# Patient Record
Sex: Male | Born: 1940 | Race: White | Hispanic: No | Marital: Married | Smoking: Current every day smoker
Health system: Southern US, Community
[De-identification: ages and names within clinical notes are randomized; demographics above are authoritative.]

## PROBLEM LIST (undated history)

## (undated) DIAGNOSIS — D649 Anemia, unspecified: Secondary | ICD-10-CM

## (undated) DIAGNOSIS — F1011 Alcohol abuse, in remission: Secondary | ICD-10-CM

## (undated) DIAGNOSIS — J449 Chronic obstructive pulmonary disease, unspecified: Secondary | ICD-10-CM

## (undated) DIAGNOSIS — I10 Essential (primary) hypertension: Secondary | ICD-10-CM

## (undated) DIAGNOSIS — I4891 Unspecified atrial fibrillation: Secondary | ICD-10-CM

## (undated) DIAGNOSIS — K284 Chronic or unspecified gastrojejunal ulcer with hemorrhage: Secondary | ICD-10-CM

## (undated) DIAGNOSIS — J9600 Acute respiratory failure, unspecified whether with hypoxia or hypercapnia: Secondary | ICD-10-CM

## (undated) DIAGNOSIS — K922 Gastrointestinal hemorrhage, unspecified: Secondary | ICD-10-CM

## (undated) DIAGNOSIS — K274 Chronic or unspecified peptic ulcer, site unspecified, with hemorrhage: Secondary | ICD-10-CM

## (undated) HISTORY — DX: Chronic obstructive pulmonary disease, unspecified: J44.9

## (undated) HISTORY — DX: Acute respiratory failure, unspecified whether with hypoxia or hypercapnia: J96.00

## (undated) HISTORY — DX: Unspecified atrial fibrillation: I48.91

## (undated) HISTORY — PX: NO PAST SURGERIES: SHX2092

## (undated) HISTORY — DX: Alcohol abuse, in remission: F10.11

## (undated) HISTORY — DX: Gastrointestinal hemorrhage, unspecified: K92.2

## (undated) HISTORY — DX: Anemia, unspecified: D64.9

## (undated) HISTORY — DX: Essential (primary) hypertension: I10

---

## 2016-06-09 ENCOUNTER — Encounter: Payer: Self-pay | Admitting: Cardiovascular Disease

## 2016-07-02 ENCOUNTER — Encounter: Payer: Self-pay | Admitting: *Deleted

## 2016-07-05 ENCOUNTER — Encounter: Payer: Self-pay | Admitting: Cardiovascular Disease

## 2016-07-05 ENCOUNTER — Ambulatory Visit (INDEPENDENT_AMBULATORY_CARE_PROVIDER_SITE_OTHER): Payer: Medicare Other | Admitting: Cardiovascular Disease

## 2016-07-05 ENCOUNTER — Other Ambulatory Visit: Payer: Self-pay | Admitting: Cardiovascular Disease

## 2016-07-05 VITALS — BP 163/65 | HR 99 | Ht 67.0 in | Wt 207.0 lb

## 2016-07-05 DIAGNOSIS — F17210 Nicotine dependence, cigarettes, uncomplicated: Secondary | ICD-10-CM

## 2016-07-05 DIAGNOSIS — Z9289 Personal history of other medical treatment: Secondary | ICD-10-CM | POA: Diagnosis not present

## 2016-07-05 DIAGNOSIS — I4891 Unspecified atrial fibrillation: Secondary | ICD-10-CM | POA: Diagnosis not present

## 2016-07-05 DIAGNOSIS — Z716 Tobacco abuse counseling: Secondary | ICD-10-CM | POA: Diagnosis not present

## 2016-07-05 DIAGNOSIS — I10 Essential (primary) hypertension: Secondary | ICD-10-CM | POA: Diagnosis not present

## 2016-07-05 DIAGNOSIS — G4733 Obstructive sleep apnea (adult) (pediatric): Secondary | ICD-10-CM | POA: Diagnosis not present

## 2016-07-05 MED ORDER — DILTIAZEM HCL ER COATED BEADS 120 MG PO CP24: 120.0000 mg | ORAL_CAPSULE | ORAL | 6 refills | Status: DC

## 2016-07-05 MED ORDER — VALSARTAN 160 MG PO TABS: 160.0000 mg | ORAL_TABLET | Freq: Every day | ORAL | 6 refills | Status: DC

## 2016-07-05 NOTE — Progress Notes (Signed)
CARDIOLOGY CONSULT NOTE  Patient ID: [name] [name] MRN: [id number] DOB/AGE: Jul 26, 1940 76 y.o.  Admit date: (Not on file) Primary Physician: Ardyth Man, MD Referring Physician: Willaim Bane  Reason for Consultation: Atrial fibrillation  HPI: [name] [name] is a 76 y.o. male who is being seen today for the evaluation of atrial fibrillation at the request of Park, Nettie Elm, MD.   He was hospitalized at [facility] in late May/early June 2018. He reportedly had a COPD exacerbation, congestive heart failure, and atrial fibrillation.   I reviewed all relevant documentation, labs, and studies.   Troponins were normal. Left ventricular systolic function was normal. He had chest tightness upon admission. Chest x-ray reportedly demonstrated mild vascular congestion. He was discharged on Xarelto.  He has a history of hypertension as well.  Relevant labs: BUN 10, creatinine 0.75, sodium 130, potassium 3.9, hemoglobin 11.7 on 06/12/16.  Echocardiogram report dated 06/10/16 showed mild left atrial enlargement, mild LVH, normal left ventricular systolic function, and no significant valvular pathology. The faxed ECG was a poor quality tracing which reportedly demonstrated atrial fibrillation, heart rate 98 bpm. This was performed on 06/09/16.  He says since being hospitalized, he feels well on most days but does report feeling weak and tired on several mornings. He denies chest pain and palpitations. He also denies lightheadedness, dizziness, and syncope. He has chronic exertional dyspnea which has not gotten worse.  He smokes 1-2 packs of cigarettes daily and has done so for several decades.  He was diagnosed with sleep apnea 8-10 years ago and tried using CPAP but could not tolerate it and sold his machine.  He denies bleeding problems.     No Known Allergies  Current Outpatient Prescriptions  Medication Sig Dispense Refill  . albuterol (PROVENTIL) (2.5 MG/3ML) 0.083% nebulizer  solution Take 2.5 mg by nebulization every 6 (six) hours as needed for wheezing or shortness of breath.    Marland Kitchen amLODipine (NORVASC) 5 MG tablet Take 5 mg by mouth daily.    . cetirizine (ZYRTEC) 10 MG tablet Take 10 mg by mouth daily.    . fluticasone (FLONASE) 50 MCG/ACT nasal spray Place 2 sprays into both nostrils daily.    . furosemide (LASIX) 20 MG tablet TK 1 T PO BID  1  . lisinopril (PRINIVIL,ZESTRIL) 20 MG tablet 80 mg.  2  . potassium chloride (K-DUR) 10 MEQ tablet TK 1 T PO ONCE TO TWICE DAILY WITH FUROSEMIDE  1  . SPIRIVA HANDIHALER 18 MCG inhalation capsule INL CONTENTS OF 1 C ONCE DAILY USING HANDIHALER  1  . SYMBICORT 160-4.5 MCG/ACT inhaler INHALE 2 PUFFS BID  5  . tamsulosin (FLOMAX) 0.4 MG CAPS capsule TK ONE C PO ONCE TO  BID PRF URINARY FREQUENCY  2  . valsartan (DIOVAN) 80 MG tablet TK 1 T PO QD  1  . XARELTO 20 MG TABS tablet TK 1 T PO D WITH SUPPER  1   No current facility-administered medications for this visit.     Past Medical History:  Diagnosis Date  . COPD (chronic obstructive pulmonary disease) (HCC)   . History of alcohol abuse   . Hypertension     Past Surgical History:  Procedure Laterality Date  . NO PAST SURGERIES      Social History   Social History  . Marital status: Married    Spouse name: N/A  . Number of children: N/A  . Years of education: N/A   Occupational  History  . Not on file.   Social History Main Topics  . Smoking status: Current Every Day Smoker    Packs/day: 2.00    Types: Cigarettes  . Smokeless tobacco: Never Used  . Alcohol use Not on file  . Drug use: Unknown  . Sexual activity: Not on file   Other Topics Concern  . Not on file   Social History Narrative  . No narrative on file     No family history of premature CAD in 1st degree relatives.  Current Meds  Medication Sig  . albuterol (PROVENTIL) (2.5 MG/3ML) 0.083% nebulizer solution Take 2.5 mg by nebulization every 6 (six) hours as needed for wheezing or  shortness of breath.  Marland Kitchen. amLODipine (NORVASC) 5 MG tablet Take 5 mg by mouth daily.  . cetirizine (ZYRTEC) 10 MG tablet Take 10 mg by mouth daily.  . fluticasone (FLONASE) 50 MCG/ACT nasal spray Place 2 sprays into both nostrils daily.  . furosemide (LASIX) 20 MG tablet TK 1 T PO BID  . lisinopril (PRINIVIL,ZESTRIL) 20 MG tablet 80 mg.  . potassium chloride (K-DUR) 10 MEQ tablet TK 1 T PO ONCE TO TWICE DAILY WITH FUROSEMIDE  . SPIRIVA HANDIHALER 18 MCG inhalation capsule INL CONTENTS OF 1 C ONCE DAILY USING HANDIHALER  . SYMBICORT 160-4.5 MCG/ACT inhaler INHALE 2 PUFFS BID  . tamsulosin (FLOMAX) 0.4 MG CAPS capsule TK ONE C PO ONCE TO  BID PRF URINARY FREQUENCY  . valsartan (DIOVAN) 80 MG tablet TK 1 T PO QD  . XARELTO 20 MG TABS tablet TK 1 T PO D WITH SUPPER      Review of systems complete and found to be negative unless listed above in HPI    Physical exam Blood pressure (!) 163/65, pulse 99, height 5\' 7"  (1.702 m), weight 207 lb (93.9 kg), SpO2 97 %. General: NAD Neck: No JVD, no thyromegaly or thyroid nodule.  Lungs: Clear to auscultation bilaterally with normal respiratory effort. CV: Nondisplaced PMI.  Rate at upper normal limits, irregular rhythm, normal S1/S2, no S3, no murmur.  No peripheral edema.  No carotid bruit.    Abdomen: Soft, nontender, obese.  Skin: Intact without lesions or rashes.  Neurologic: Alert and oriented x 3.  Psych: Normal affect. Extremities: No clubbing or cyanosis.  HEENT: Normal.   ECG: Most recent ECG reviewed.   Labs: No results found for: K, BUN, CREATININE, ALT, TSH, HGB   Lipids: No results found for: LDLCALC, LDLDIRECT, CHOL, TRIG, HDL      ASSESSMENT AND PLAN:  1. Atrial fibrillation with complaints of weakness and fatigue: Anticoagulated with Xarelto. CHADSVASC score is 4. He needs more optimal rate control. I will discontinue amlodipine and start long-acting diltiazem 120 mg daily. I spoke to him about the importance of tobacco  cessation and sleep apnea treatment. I will obtain a sleep study.  2. Hypertension: He is hypertensive. He is taking both lisinopril and valsartan. There is no indication to be on both an ACEI and ARB. I will stop lisinopril and increase valsartan to 160 mg daily. He is also on amlodipine 5 mg daily. I have discontinued this in favor of long-acting diltiazem 120 mg daily for heart rate control.  3. Tobacco abuse: Cessation counseling provided (3 minutes).  4. Obstructive sleep apnea: I will obtain a sleep study. Sleep apnea is an independent risk factor for MI, stroke, and cardiac arrhythmia.  Disposition: Follow up in 1 month  Signed: Prentice DockerSuresh Katriona Schmierer, M.D., F.A.C.C.  07/05/2016, 10:00  AM

## 2016-07-05 NOTE — Patient Instructions (Addendum)
Medication Instructions:   Stop Amlodipine (Norvasc).  Begin Cardizem CD 120mg  every morning.  Stop Lisinopril.   Increase Valsartan 160mg  daily.  Continue all other medications.    Labwork: none  Testing/Procedures: You have been referred to:  [facility] for evaluation of sleep apnea.   Follow-Up: 4-6 weeks   Any Other Special Instructions Will Be Listed Below (If Applicable).  If you need a refill on your cardiac medications before your next appointment, please call your pharmacy.

## 2016-08-10 ENCOUNTER — Ambulatory Visit (INDEPENDENT_AMBULATORY_CARE_PROVIDER_SITE_OTHER): Payer: Medicare Other | Admitting: Neurology

## 2016-08-10 ENCOUNTER — Encounter (INDEPENDENT_AMBULATORY_CARE_PROVIDER_SITE_OTHER): Payer: Self-pay

## 2016-08-10 ENCOUNTER — Encounter: Payer: Self-pay | Admitting: Neurology

## 2016-08-10 VITALS — BP 148/69 | HR 40 | Ht 67.0 in | Wt 206.0 lb

## 2016-08-10 DIAGNOSIS — G4733 Obstructive sleep apnea (adult) (pediatric): Secondary | ICD-10-CM

## 2016-08-10 DIAGNOSIS — E669 Obesity, unspecified: Secondary | ICD-10-CM | POA: Diagnosis not present

## 2016-08-10 DIAGNOSIS — I481 Persistent atrial fibrillation: Secondary | ICD-10-CM | POA: Diagnosis not present

## 2016-08-10 DIAGNOSIS — J449 Chronic obstructive pulmonary disease, unspecified: Secondary | ICD-10-CM | POA: Diagnosis not present

## 2016-08-10 DIAGNOSIS — I4819 Other persistent atrial fibrillation: Secondary | ICD-10-CM

## 2016-08-10 DIAGNOSIS — R600 Localized edema: Secondary | ICD-10-CM | POA: Diagnosis not present

## 2016-08-10 DIAGNOSIS — F17200 Nicotine dependence, unspecified, uncomplicated: Secondary | ICD-10-CM | POA: Diagnosis not present

## 2016-08-10 DIAGNOSIS — F1190 Opioid use, unspecified, uncomplicated: Secondary | ICD-10-CM | POA: Diagnosis not present

## 2016-08-10 NOTE — Patient Instructions (Signed)
Based on your symptoms and your exam I believe you are at risk for obstructive sleep apnea or OSA, and I think we should proceed with a sleep study to determine whether you do or do not have OSA and how severe it is. If you have more than mild OSA, I want you to consider treatment with CPAP. Please remember, the risks and ramifications of moderate to severe obstructive sleep apnea or OSA are: Cardiovascular disease, including congestive heart failure, stroke, difficult to control hypertension, arrhythmias, and even type 2 diabetes has been linked to untreated OSA. Sleep apnea causes disruption of sleep and sleep deprivation in most cases, which, in turn, can cause recurrent headaches, problems with memory, mood, concentration, focus, and vigilance. Most people with untreated sleep apnea report excessive daytime sleepiness, which can affect their ability to drive. Please do not drive if you feel sleepy.   You are at risk for desaturations due to your smoking, obesity, COPD, and taking oxycodone.   I will likely see you back after your sleep study to go over the test results and where to go from there. We will call you after your sleep study to advise about the results (most likely, you will hear from Lafonda Mossesiana, my nurse) and to set up an appointment at the time, as necessary.    Our sleep lab administrative assistant, Alvis LemmingsDawn will meet with you or call you to schedule your sleep study. If you don't hear back from her by next week please feel free to call her at [phone]. This is her direct line and please leave a message with your phone number to call back if you get the voicemail box. She will call back as soon as possible.

## 2016-08-10 NOTE — Progress Notes (Addendum)
Subjective:    Patient ID: [name] [name] is a 76 y.o. male.  HPI     Huston Foley, MD, PhD [facility] [address] [address] [place], [place] [address]  Dear Darliss Ridgel,   I saw your patient, [name] [name], upon your kind request in my neurologic clinic today for initial consultation of his sleep disorder, in particular, reevaluation of his prior diagnosis of OSA. The patient is unaccompanied today. As you know, [name] [name] is a 76 year old right-handed gentleman with an underlying complex medical history of COPD, congestive heart failure, recent diagnosis of A. fib, hypertension, allergies, smoking of over 60 years (still at 2 ppd), alcohol abuse by chart review (patient says he is not a "drinker"), chronic LBP on chronic narcotic pain med and obesity, who was previously diagnosed with obstructive sleep apnea several years ago and placed on CPAP therapy. Prior sleep study results are not available for my review today. He is no longer on CPAP. A CPAP compliance download is not available for my review today. I reviewed your office note from 07/05/2016.  He drinks alcohol, in the form of whiskey, a mixed drink 2-3 per day, between 2 PM and 8 PM typically. He has no RLS symptoms, no FHx of OSA. Has nocturia currently up to 4 times per night, as he is on Lasix. He is on oxycodone per PCP, usually once daily. He does not see a pulmonologist for his COPD. He has a puppy, has been bruising very easily. Is on Xarelto. He has 2 grown sons, one is in business with him. 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 He was told to limit his water intake as his sodium level has been low on several occasions. Bedtime is around 9 or 10 PM, wakeup time around 5, he has typically no recurrent headaches or morning headaches. He would be willing to consider CPAP therapy again but in the past struggled with  the CPAP and did not get along, as he puts it. He does feel tired during the day. He does not have significant difficulty going to sleep or staying asleep. His Epworth sleepiness score is 13 out of 24, fatigue score is 31 out of 63.  His Past Medical History Is Significant For: Past Medical History:  Diagnosis Date  . Atrial fibrillation (HCC)   . COPD (chronic obstructive pulmonary disease) (HCC)   . History of alcohol abuse   . Hypertension     His Past Surgical History Is Significant For: Past Surgical History:  Procedure Laterality Date  . NO PAST SURGERIES      His Family History Is Significant For: Family History  Problem Relation Age of Onset  . Arrhythmia Brother   . Atrial fibrillation Brother     His Social History Is Significant For: Social History   Social History  . Marital status: Married    Spouse name: N/A  . Number of children: N/A  . Years of education: N/A   Social History Main Topics  . Smoking status: Current Every Day Smoker    Packs/day: 2.00    Types: Cigarettes  . Smokeless tobacco: Never Used  . Alcohol use None  . Drug use: Unknown  . Sexual activity: Not Asked   Other Topics Concern  . None   Social History Narrative  . None    His Allergies Are:  No Known Allergies:  His Current Medications Are:  Outpatient Encounter Prescriptions as of 08/10/2016  Medication Sig  . albuterol (PROVENTIL) (2.5 MG/3ML) 0.083% nebulizer solution Take 2.5 mg by nebulization every 6 (six) hours as needed for wheezing or shortness of breath.  . CARTIA XT 120 MG 24 hr capsule TAKE 1 CAPSULE BY MOUTH EVERY MORNING  . cetirizine (ZYRTEC) 10 MG tablet Take 10 mg by mouth daily.  . fluticasone (FLONASE) 50 MCG/ACT nasal spray Place 2 sprays into both nostrils daily.  . furosemide (LASIX) 20 MG tablet TK 1 T PO BID  . oxyCODONE-acetaminophen (PERCOCET/ROXICET) 5-325 MG tablet Take 1 tablet by mouth every 6 (six) hours as needed.  . potassium chloride  (K-DUR) 10 MEQ tablet TK 1 T PO ONCE TO TWICE DAILY WITH FUROSEMIDE  . SPIRIVA HANDIHALER 18 MCG inhalation capsule INL CONTENTS OF 1 C ONCE DAILY USING HANDIHALER  . SYMBICORT 160-4.5 MCG/ACT inhaler INHALE 2 PUFFS BID  . tamsulosin (FLOMAX) 0.4 MG CAPS capsule TK ONE C PO ONCE TO  BID PRF URINARY FREQUENCY  . valsartan (DIOVAN) 160 MG tablet TAKE 1 TABLET BY MOUTH DAILY  . XARELTO 20 MG TABS tablet TK 1 T PO D WITH SUPPER   No facility-administered encounter medications on file as of 08/10/2016.   :  Review of Systems:  Out of a complete 14 point review of systems, all are reviewed and negative with the exception of these symptoms as listed below:  Review of Systems  Neurological:       Pt presents today to discuss his sleep. Pt had a sleep study over 8 years ago and has tried cpap but "did not get along." Pt reports that he does not snore as bad now as he used to.  Epworth Sleepiness Scale 0= would never doze 1= slight chance of dozing 2= moderate chance of dozing 3= high chance of dozing  Sitting and reading: 3 Watching TV: 3 Sitting inactive in a public place (ex. Theater or meeting): 3 As a passenger in a car for an hour without a break: 1 Lying down to rest in the afternoon: 2 Sitting and talking to someone: 0 Sitting quietly after lunch (no alcohol): 1 In a car, while stopped in traffic: 0 Total: 13     Objective:  Neurological Exam  Physical Exam Physical Examination:   Vitals:   08/10/16 0838  BP: (!) 148/69  Pulse: (!) 40    General Examination: The patient is a very pleasant 76 y.o. male in no acute distress. He appears deconditioned. He is well groomed. He is wheezing and seems to be slightly short of breath at rest. He does report that he has a nebulizer which he was planning to use this morning but did not get around to doing it.   HEENT: Normocephalic, atraumatic, pupils are equal, round and reactive to light and accommodation. He has corrective  eyeglasses. Extraocular tracking is good without limitation to gaze excursion or nystagmus noted. Normal smooth pursuit is noted. Hearing is mildly impaired. Face is symmetric with normal facial animation and normal facial sensation. Speech is clear with  edentulous speech. His voice is raspy. Neck circumference is 18-1/8 inches. He has a soft tissue swelling in the posterior right neck, dime size, movable, may be a lymph node, does not appear to be soft enough for fatty tissue/lipoma. He is advised to talk to his primary care physician about it. He has noted the swelling in the past 6 months. There is no lip, neck/head, jaw  or voice tremor. Neck is supple with full range of passive and active motion. Oropharynx exam reveals: moderate mouth dryness, edentulous, market airway crowding secondary to thicker tongue, thicker soft palate, redundant soft palate, large uvula, tonsils in place, tonsils not fully visualized. Mallampati is class III. Tongue protrudes centrally and palate elevates symmetrically.   Chest: Inspiratory coarse breath sounds and expiratory wheezing noted throughout.   Heart: S1+S2+0,  irregular pulse and heart rate.   Abdomen: Soft, non-tender and non-distended with normal bowel sounds appreciated on auscultation.  Extremities: There is 2+ pitting edema in the distal lower extremities bilaterally. Pedal pulses are intact.  Skin: Warm and dry without trophic changes noted. There are no varicose veins.chronic appearing swelling, thickened skin, redness of distal lower extremities throughout. Multiple bruises noted throughout the forearms and hands.   Musculoskeletal: exam reveals no obvious joint deformities, tenderness or joint swelling or erythema.   Neurologically:  Mental status: The patient is awake, alert and oriented in all 4 spheres. His immediate and remote memory, attention, language skills and fund of knowledge are appropriate. There is no evidence of aphasia, agnosia,  apraxia or anomia. Speech is clear with normal prosody and enunciation. Thought process is linear. Mood is normal and affect is normal.  Cranial nerves II - XII are as described above under HEENT exam. In addition: shoulder shrug is normal with equal shoulder height noted. Motor exam: Normal bulk, strength and tone is noted. There is no drift, tremor or rebound. Romberg is negative. Reflexes are 1+ throughout. Fine motor skills and coordination: intact with normal finger taps, normal hand movements, normal rapid alternating patting, normal foot taps and normal foot agility.  Cerebellar testing: No dysmetria or intention tremor. There is no truncal or gait ataxia.  Sensory exam: intact to light touch in the upper and lower extremities.  Gait, station and balance: He stands with mild difficulty, stands naturally a little wide-based, walks slowly and cautiously, tandem walk is not requested for safety.                Assessment and Plan:  In summary, [name] [name] is a very pleasant 76 y.o.-year old male with an underlying complex medical history of COPD, congestive heart failure, recent diagnosis of A. fib, hypertension, allergies, smoking of over 60 years (still at 2 ppd), alcohol abuse by chart review (patient says he is not a "drinker"), chronic LBP on chronic narcotic pain med and obesity, whose history and physical exam are in keeping with obstructive sleep apnea (OSA)And COPD overlap. He is at high risk for severe desaturations during sleep and I advised him of this. His risk factors for severe oxygen desaturations are obesity, obstructive sleep apnea, taking chronic narcotic pain medication, COPD and ongoing smoking. He is strongly advised to quit smoking. He is advised to talk to his PCP about seeing a pulmonologist. He is wheezing today. he is furthermore advised to limit his alcohol intake because it can affect his balance, and cause sleep disruption. Of note, he does report a recent fall in the  past 6 months, did not feel lightheaded, had stepped out of the shower and was on his way back from the laundry room, thankfully no injuries, fell onto a sofa. Given his anticoagulation he is at risk for severe injuries and bleeds. He is also advised to talk to his primary care physician about the soft tissue swelling in the posterior right neck, could be a lymph node. I had a long chat with the  patient about my findings and the diagnosis of OSA, its prognosis and treatment options. We talked about medical treatments, surgical interventions and non-pharmacological approaches. I explained in particular the risks and ramifications of untreated moderate to severe OSA, especially with respect to developing cardiovascular disease down the Road, including congestive heart failure, difficult to treat hypertension, cardiac arrhythmias, or stroke. Even type 2 diabetes has, in part, been linked to untreated OSA. Symptoms of untreated OSA include daytime sleepiness, memory problems, mood irritability and mood disorder such as depression and anxiety, lack of energy, as well as recurrent headaches, especially morning headaches. We talked about smoking cessation and trying to maintain a healthy lifestyle in general, as well as the importance of weight control. I encouraged the patient to eat healthy, exercise daily and keep well hydrated, to keep a scheduled bedtime and wake time routine, to not skip any meals and eat healthy snacks in between meals. I advised the patient not to drive when feeling sleepy. I recommended the following at this time: sleep study with potential positive airway pressure titration. (We will score hypopneas at 4%).   I explained the sleep test procedure to the patient and also outlined possible surgical and non-surgical treatment options of OSA, including the use of a custom-made dental device (which would require a referral to a specialist dentist or oral surgeon), upper airway surgical options,  such as pillar implants, radiofrequency surgery, tongue base surgery, and UPPP (which would involve a referral to an ENT surgeon). Rarely, jaw surgery such as mandibular advancement may be considered.  I also explained the CPAP treatment option to the patient, who indicated that he would be willing to try CPAP if the need arises. I explained the importance of being compliant with PAP treatment, not only for insurance purposes but primarily to improve His symptoms, and for the patient's long term health benefit, including to reduce His cardiovascular risks. I answered all his questions today and the patient was in agreement. I would like to see him back after the sleep study is completed and encouraged him to call with any interim questions, concerns, problems or updates.   Thank you very much for allowing me to participate in the care of this nice patient. If I can be of any further assistance to you please do not hesitate to call me at [phone].  Sincerely,   Huston FoleySaima Brithany Whitworth, MD, PhD

## 2016-08-12 ENCOUNTER — Other Ambulatory Visit: Payer: Self-pay | Admitting: Cardiovascular Disease

## 2016-08-12 ENCOUNTER — Encounter: Payer: Self-pay | Admitting: Cardiovascular Disease

## 2016-08-12 ENCOUNTER — Ambulatory Visit (INDEPENDENT_AMBULATORY_CARE_PROVIDER_SITE_OTHER): Payer: Medicare Other | Admitting: Cardiovascular Disease

## 2016-08-12 ENCOUNTER — Encounter: Payer: Self-pay | Admitting: *Deleted

## 2016-08-12 VITALS — BP 151/68 | HR 66 | Ht 67.0 in | Wt 207.0 lb

## 2016-08-12 DIAGNOSIS — I10 Essential (primary) hypertension: Secondary | ICD-10-CM | POA: Diagnosis not present

## 2016-08-12 DIAGNOSIS — I4891 Unspecified atrial fibrillation: Secondary | ICD-10-CM

## 2016-08-12 DIAGNOSIS — Z720 Tobacco use: Secondary | ICD-10-CM

## 2016-08-12 DIAGNOSIS — G4733 Obstructive sleep apnea (adult) (pediatric): Secondary | ICD-10-CM

## 2016-08-12 DIAGNOSIS — I5033 Acute on chronic diastolic (congestive) heart failure: Secondary | ICD-10-CM

## 2016-08-12 MED ORDER — LOSARTAN POTASSIUM 50 MG PO TABS: 75.0000 mg | ORAL_TABLET | Freq: Every day | ORAL | 6 refills | Status: DC

## 2016-08-12 MED ORDER — FUROSEMIDE 40 MG PO TABS: 40.0000 mg | ORAL_TABLET | Freq: Every day | ORAL | Status: DC

## 2016-08-12 MED ORDER — FUROSEMIDE 40 MG PO TABS: 40.0000 mg | ORAL_TABLET | Freq: Every day | ORAL | 6 refills | Status: DC

## 2016-08-12 NOTE — Progress Notes (Signed)
SUBJECTIVE: The patient presents for follow-up of atrial fibrillation and chronic diastolic heart failure. Echocardiogram report dated 06/10/16 showed mild left atrial enlargement, mild LVH, normal left ventricular systolic function, and no significant valvular pathology.   He recently developed bilateral leg swelling and abdominal distention and has been taking Lasix 20 mg twice daily with symptom improvement.  He tells me he is not interested in repeating a sleep study but also does not want to go against doctor's recommendations.   Review of Systems: As per "subjective", otherwise negative.  No Known Allergies  Current Outpatient Prescriptions  Medication Sig Dispense Refill  . albuterol (PROVENTIL) (2.5 MG/3ML) 0.083% nebulizer solution Take 2.5 mg by nebulization every 6 (six) hours as needed for wheezing or shortness of breath.    . CARTIA XT 120 MG 24 hr capsule TAKE 1 CAPSULE BY MOUTH EVERY MORNING 90 capsule 6  . cetirizine (ZYRTEC) 10 MG tablet Take 10 mg by mouth daily.    . fluticasone (FLONASE) 50 MCG/ACT nasal spray Place 2 sprays into both nostrils daily.    . furosemide (LASIX) 20 MG tablet TK 1 T PO BID  1  . losartan (COZAAR) 50 MG tablet Take 50 mg by mouth daily.    Marland Kitchen. oxyCODONE-acetaminophen (PERCOCET/ROXICET) 5-325 MG tablet Take 1 tablet by mouth every 6 (six) hours as needed.  0  . potassium chloride (K-DUR) 10 MEQ tablet TK 1 T PO ONCE TO TWICE DAILY WITH FUROSEMIDE  1  . SPIRIVA HANDIHALER 18 MCG inhalation capsule INL CONTENTS OF 1 C ONCE DAILY USING HANDIHALER  1  . SYMBICORT 160-4.5 MCG/ACT inhaler INHALE 2 PUFFS BID  5  . tamsulosin (FLOMAX) 0.4 MG CAPS capsule TK ONE C PO ONCE TO  BID PRF URINARY FREQUENCY  2  . XARELTO 20 MG TABS tablet TK 1 T PO D WITH SUPPER  1   No current facility-administered medications for this visit.     Past Medical History:  Diagnosis Date  . Atrial fibrillation (HCC)   . COPD (chronic obstructive pulmonary disease)  (HCC)   . History of alcohol abuse   . Hypertension     Past Surgical History:  Procedure Laterality Date  . NO PAST SURGERIES      Social History   Social History  . Marital status: Married    Spouse name: N/A  . Number of children: N/A  . Years of education: N/A   Occupational History  . Not on file.   Social History Main Topics  . Smoking status: Current Every Day Smoker    Packs/day: 2.00    Types: Cigarettes  . Smokeless tobacco: Never Used  . Alcohol use Not on file  . Drug use: Unknown  . Sexual activity: Not on file   Other Topics Concern  . Not on file   Social History Narrative  . No narrative on file     Vitals:   08/12/16 1106  BP: (!) 151/68  Pulse: 66  SpO2: 98%  Weight: 207 lb (93.9 kg)  Height: 5\' 7"  (1.702 m)    Wt Readings from Last 3 Encounters:  08/12/16 207 lb (93.9 kg)  08/10/16 206 lb (93.4 kg)  07/05/16 207 lb (93.9 kg)     PHYSICAL EXAM General: NAD HEENT: Normal. Neck: No JVD, no thyromegaly. Lungs: Bilateral faint inspiratory and expiratory wheezes. CV: Nondisplaced PMI.  Regular rate and irregular rhythm, normal S1/S2, no S3, no murmur. Trace bilateral pretibial edema.   Abdomen:  Firm, protuberant.  Neurologic: Alert and oriented.  Psych: Normal affect. Skin: Normal. Musculoskeletal: No gross deformities.    ECG: Most recent ECG reviewed.   Labs: No results found for: K, BUN, CREATININE, ALT, TSH, HGB   Lipids: No results found for: LDLCALC, LDLDIRECT, CHOL, TRIG, HDL     ASSESSMENT AND PLAN:  1. Atrial fibrillation with complaints of weakness and fatigue: Symptomatically improved with good heart rate control. Anticoagulated with Xarelto. CHADSVASC score is 4. HR controlled on long-acting diltiazem 120 mg daily. He also needs treatment for sleep apnea and is now being followed by sleep specialist. I strongly encouraged him to follow the advice of both his neurologist and myself and pursue a sleep study.  2.  Hypertension: He is hypertensive. Sleep apnea is contributing. Will continue to monitor this. I will increase losartan to 75 mg daily.  3. Tobacco abuse: Cessation counseling previously provided.  4. Obstructive sleep apnea:Sleep study demonstrated obstructive sleep apnea. He is now being followed by a sleep specialist. I strongly encouraged him to follow the advice of both his neurologist and myself and pursue a sleep study.  5. Acute on chronic diastolic heart failure: I instructed him to take Lasix 40 mg daily with supplemental potassium. I also encouraged him to pursue sleep apnea treatment.   Disposition: Follow up 3 months   Prentice DockerSuresh Koneswaran, M.D., F.A.C.C.

## 2016-08-12 NOTE — Patient Instructions (Signed)
Medication Instructions:   Increase Losartan to 75mg  daily.  Change Lasix to 40mg  daily  Continue all other current medications.  Labwork: none  Testing/Procedures: none  Follow-Up: 3 months   Any Other Special Instructions Will Be Listed Below (If Applicable).  If you need a refill on your cardiac medications before your next appointment, please call your pharmacy.

## 2016-11-15 ENCOUNTER — Other Ambulatory Visit: Payer: Self-pay | Admitting: Cardiovascular Disease

## 2016-11-15 ENCOUNTER — Encounter: Payer: Self-pay | Admitting: Cardiovascular Disease

## 2016-11-15 ENCOUNTER — Ambulatory Visit (INDEPENDENT_AMBULATORY_CARE_PROVIDER_SITE_OTHER): Payer: Medicare Other | Admitting: Cardiovascular Disease

## 2016-11-15 VITALS — BP 159/75 | HR 76 | Ht 67.0 in | Wt 213.8 lb

## 2016-11-15 DIAGNOSIS — I5033 Acute on chronic diastolic (congestive) heart failure: Secondary | ICD-10-CM

## 2016-11-15 DIAGNOSIS — G4733 Obstructive sleep apnea (adult) (pediatric): Secondary | ICD-10-CM

## 2016-11-15 DIAGNOSIS — I10 Essential (primary) hypertension: Secondary | ICD-10-CM

## 2016-11-15 DIAGNOSIS — Z720 Tobacco use: Secondary | ICD-10-CM | POA: Diagnosis not present

## 2016-11-15 DIAGNOSIS — I4891 Unspecified atrial fibrillation: Secondary | ICD-10-CM

## 2016-11-15 MED ORDER — LOSARTAN POTASSIUM 100 MG PO TABS: 100.0000 mg | ORAL_TABLET | Freq: Every day | ORAL | 6 refills | Status: DC

## 2016-11-15 NOTE — Patient Instructions (Signed)
Medication Instructions:   Increase Losartan to 100mg  daily.   Continue all other medications.    Labwork: none  Testing/Procedures: none  Follow-Up: Your physician wants you to follow up in: 6 months.  You will receive a reminder letter in the mail one-two months in advance.  If you don't receive a letter, please call our office to schedule the follow up appointment   Any Other Special Instructions Will Be Listed Below (If Applicable).  If you need a refill on your cardiac medications before your next appointment, please call your pharmacy.

## 2016-11-15 NOTE — Progress Notes (Signed)
SUBJECTIVE: The patient presents for follow-up of atrial fibrillation chronic diastolic heart failure.  He said his ankle swell up about once every 6 months and takes Lasix with supplemental potassium during those times.  He recently started taking Lasix about 3 days ago for bilateral ankle and leg swelling.  He said his systolic blood pressure normally runs in the 150-170 range.      Review of Systems: As per "subjective", otherwise negative.  No Known Allergies  Current Outpatient Medications  Medication Sig Dispense Refill  . albuterol (PROVENTIL) (2.5 MG/3ML) 0.083% nebulizer solution Take 2.5 mg by nebulization every 6 (six) hours as needed for wheezing or shortness of breath.    . CARTIA XT 120 MG 24 hr capsule TAKE 1 CAPSULE BY MOUTH EVERY MORNING 90 capsule 6  . cetirizine (ZYRTEC) 10 MG tablet Take 10 mg by mouth daily.    . fluticasone (FLONASE) 50 MCG/ACT nasal spray Place 2 sprays into both nostrils daily.    . furosemide (LASIX) 40 MG tablet Take 40 mg daily as needed by mouth.    Marland Kitchen. ibuprofen (ADVIL,MOTRIN) 800 MG tablet Take 1 tablet daily as needed by mouth.  2  . losartan (COZAAR) 100 MG tablet Take 1 tablet (100 mg total) daily by mouth. 30 tablet 6  . oxyCODONE-acetaminophen (PERCOCET/ROXICET) 5-325 MG tablet Take 1 tablet by mouth every 6 (six) hours as needed.  0  . potassium chloride (K-DUR) 10 MEQ tablet TK 1 T PO ONCE TO TWICE DAILY WITH FUROSEMIDE  1  . potassium chloride SA (K-DUR,KLOR-CON) 20 MEQ tablet Take 1 tablet daily as needed by mouth.  1  . SPIRIVA HANDIHALER 18 MCG inhalation capsule INL CONTENTS OF 1 C ONCE DAILY USING HANDIHALER  1  . SYMBICORT 160-4.5 MCG/ACT inhaler INHALE 2 PUFFS BID  5  . tamsulosin (FLOMAX) 0.4 MG CAPS capsule TK ONE C PO ONCE TO  BID PRF URINARY FREQUENCY  2  . XARELTO 20 MG TABS tablet TK 1 T PO D WITH SUPPER  1   No current facility-administered medications for this visit.     Past Medical History:  Diagnosis  Date  . Atrial fibrillation (HCC)   . COPD (chronic obstructive pulmonary disease) (HCC)   . History of alcohol abuse   . Hypertension     Past Surgical History:  Procedure Laterality Date  . NO PAST SURGERIES      Social History   Socioeconomic History  . Marital status: Married    Spouse name: Not on file  . Number of children: Not on file  . Years of education: Not on file  . Highest education level: Not on file  Social Needs  . Financial resource strain: Not on file  . Food insecurity - worry: Not on file  . Food insecurity - inability: Not on file  . Transportation needs - medical: Not on file  . Transportation needs - non-medical: Not on file  Occupational History  . Not on file  Tobacco Use  . Smoking status: Current Every Day Smoker    Packs/day: 2.00    Types: Cigarettes    Start date: 11/07/1952  . Smokeless tobacco: Never Used  Substance and Sexual Activity  . Alcohol use: Not on file  . Drug use: Not on file  . Sexual activity: Not on file  Other Topics Concern  . Not on file  Social History Narrative  . Not on file     Vitals:  11/15/16 0756  BP: (!) 159/75  Pulse: 76  SpO2: 96%  Weight: 213 lb 12.8 oz (97 kg)  Height: 5\' 7"  (1.702 m)    Wt Readings from Last 3 Encounters:  11/15/16 213 lb 12.8 oz (97 kg)  08/12/16 207 lb (93.9 kg)  08/10/16 206 lb (93.4 kg)     PHYSICAL EXAM General: NAD HEENT: Normal. Neck: No JVD, no thyromegaly. Lungs: Clear to auscultation bilaterally with normal respiratory effort. CV: Regular rate and irregular rhythm, normal S1/S2, no S3, no murmur. Trace bilateral pretibial and periankle edema.    Abdomen: Soft, nontender, no distention.  Neurologic: Alert and oriented.  Psych: Normal affect. Skin: Normal. Musculoskeletal: No gross deformities.    ECG: Most recent ECG reviewed.   Labs: No results found for: K, BUN, CREATININE, ALT, TSH, HGB   Lipids: No results found for: LDLCALC, LDLDIRECT,  CHOL, TRIG, HDL     ASSESSMENT AND PLAN: 1.  Persistent atrial fibrillation: Symptomatically stable with adequate heart rate control on long-acting diltiazem 120 mill grams daily.  He is anticoagulated with Xarelto.  2.  Chronic hypertension: Blood pressure is elevated.  I will increase losartan to 100 mg daily.  3.  Tobacco abuse: Cessation counseling previous he provided.  4.  Obstructive sleep apnea: Sleep study previously recommended.  5.  Acute on chronic diastolic heart failure:  He is currently taking Lasix with supplemental potassium.  I will aim to control blood pressure.       Disposition: Follow up 6 months   Prentice Docker, M.D., F.A.C.C.

## 2016-12-26 ENCOUNTER — Inpatient Hospital Stay (HOSPITAL_COMMUNITY)
Admission: AD | Admit: 2016-12-26 | Discharge: 2017-01-17 | DRG: 377 | Disposition: A | Discharge status: Skilled Nursing Facility | Payer: Medicare Other | Source: Other Acute Inpatient Hospital | Attending: Family Medicine | Admitting: Family Medicine

## 2016-12-26 DIAGNOSIS — J441 Chronic obstructive pulmonary disease with (acute) exacerbation: Secondary | ICD-10-CM | POA: Diagnosis present

## 2016-12-26 DIAGNOSIS — K2960 Other gastritis without bleeding: Secondary | ICD-10-CM | POA: Diagnosis not present

## 2016-12-26 DIAGNOSIS — R0602 Shortness of breath: Secondary | ICD-10-CM | POA: Diagnosis not present

## 2016-12-26 DIAGNOSIS — Z66 Do not resuscitate: Secondary | ICD-10-CM | POA: Diagnosis not present

## 2016-12-26 DIAGNOSIS — E538 Deficiency of other specified B group vitamins: Secondary | ICD-10-CM | POA: Diagnosis not present

## 2016-12-26 DIAGNOSIS — G4733 Obstructive sleep apnea (adult) (pediatric): Secondary | ICD-10-CM | POA: Diagnosis not present

## 2016-12-26 DIAGNOSIS — F10231 Alcohol dependence with withdrawal delirium: Secondary | ICD-10-CM | POA: Diagnosis present

## 2016-12-26 DIAGNOSIS — K922 Gastrointestinal hemorrhage, unspecified: Secondary | ICD-10-CM | POA: Diagnosis not present

## 2016-12-26 DIAGNOSIS — T502X5A Adverse effect of carbonic-anhydrase inhibitors, benzothiadiazides and other diuretics, initial encounter: Secondary | ICD-10-CM | POA: Diagnosis not present

## 2016-12-26 DIAGNOSIS — K257 Chronic gastric ulcer without hemorrhage or perforation: Secondary | ICD-10-CM

## 2016-12-26 DIAGNOSIS — E860 Dehydration: Secondary | ICD-10-CM | POA: Diagnosis not present

## 2016-12-26 DIAGNOSIS — K254 Chronic or unspecified gastric ulcer with hemorrhage: Secondary | ICD-10-CM | POA: Diagnosis present

## 2016-12-26 DIAGNOSIS — J9601 Acute respiratory failure with hypoxia: Secondary | ICD-10-CM | POA: Diagnosis present

## 2016-12-26 DIAGNOSIS — E871 Hypo-osmolality and hyponatremia: Secondary | ICD-10-CM | POA: Diagnosis present

## 2016-12-26 DIAGNOSIS — R609 Edema, unspecified: Secondary | ICD-10-CM | POA: Diagnosis not present

## 2016-12-26 DIAGNOSIS — E876 Hypokalemia: Secondary | ICD-10-CM | POA: Diagnosis not present

## 2016-12-26 DIAGNOSIS — I482 Chronic atrial fibrillation: Secondary | ICD-10-CM | POA: Diagnosis not present

## 2016-12-26 DIAGNOSIS — J9602 Acute respiratory failure with hypercapnia: Secondary | ICD-10-CM | POA: Diagnosis present

## 2016-12-26 DIAGNOSIS — F17210 Nicotine dependence, cigarettes, uncomplicated: Secondary | ICD-10-CM | POA: Diagnosis present

## 2016-12-26 DIAGNOSIS — I110 Hypertensive heart disease with heart failure: Secondary | ICD-10-CM | POA: Diagnosis present

## 2016-12-26 DIAGNOSIS — R630 Anorexia: Secondary | ICD-10-CM | POA: Diagnosis present

## 2016-12-26 DIAGNOSIS — G9340 Encephalopathy, unspecified: Secondary | ICD-10-CM | POA: Diagnosis not present

## 2016-12-26 DIAGNOSIS — R509 Fever, unspecified: Secondary | ICD-10-CM | POA: Diagnosis not present

## 2016-12-26 DIAGNOSIS — F0390 Unspecified dementia without behavioral disturbance: Secondary | ICD-10-CM | POA: Diagnosis present

## 2016-12-26 DIAGNOSIS — I5031 Acute diastolic (congestive) heart failure: Secondary | ICD-10-CM | POA: Diagnosis not present

## 2016-12-26 DIAGNOSIS — G9349 Other encephalopathy: Secondary | ICD-10-CM | POA: Diagnosis present

## 2016-12-26 DIAGNOSIS — J9600 Acute respiratory failure, unspecified whether with hypoxia or hypercapnia: Secondary | ICD-10-CM | POA: Diagnosis not present

## 2016-12-26 DIAGNOSIS — K259 Gastric ulcer, unspecified as acute or chronic, without hemorrhage or perforation: Secondary | ICD-10-CM | POA: Diagnosis present

## 2016-12-26 DIAGNOSIS — Z7901 Long term (current) use of anticoagulants: Secondary | ICD-10-CM

## 2016-12-26 DIAGNOSIS — I4892 Unspecified atrial flutter: Secondary | ICD-10-CM | POA: Diagnosis present

## 2016-12-26 DIAGNOSIS — M899 Disorder of bone, unspecified: Secondary | ICD-10-CM | POA: Diagnosis not present

## 2016-12-26 DIAGNOSIS — I5033 Acute on chronic diastolic (congestive) heart failure: Secondary | ICD-10-CM | POA: Diagnosis present

## 2016-12-26 DIAGNOSIS — R0902 Hypoxemia: Secondary | ICD-10-CM

## 2016-12-26 DIAGNOSIS — K921 Melena: Secondary | ICD-10-CM

## 2016-12-26 DIAGNOSIS — Z7951 Long term (current) use of inhaled steroids: Secondary | ICD-10-CM

## 2016-12-26 DIAGNOSIS — R1310 Dysphagia, unspecified: Secondary | ICD-10-CM | POA: Diagnosis present

## 2016-12-26 DIAGNOSIS — K2970 Gastritis, unspecified, without bleeding: Secondary | ICD-10-CM | POA: Diagnosis not present

## 2016-12-26 DIAGNOSIS — D62 Acute posthemorrhagic anemia: Secondary | ICD-10-CM

## 2016-12-26 DIAGNOSIS — I4891 Unspecified atrial fibrillation: Secondary | ICD-10-CM | POA: Diagnosis not present

## 2016-12-26 DIAGNOSIS — N400 Enlarged prostate without lower urinary tract symptoms: Secondary | ICD-10-CM | POA: Diagnosis present

## 2016-12-26 DIAGNOSIS — I10 Essential (primary) hypertension: Secondary | ICD-10-CM | POA: Diagnosis not present

## 2016-12-26 DIAGNOSIS — Y900 Blood alcohol level of less than 20 mg/100 ml: Secondary | ICD-10-CM | POA: Diagnosis present

## 2016-12-26 DIAGNOSIS — K7030 Alcoholic cirrhosis of liver without ascites: Secondary | ICD-10-CM | POA: Diagnosis present

## 2016-12-26 DIAGNOSIS — Z79899 Other long term (current) drug therapy: Secondary | ICD-10-CM

## 2016-12-26 LAB — GLUCOSE, CAPILLARY
GLUCOSE-CAPILLARY: 126 mg/dL — AB (ref 65–99)
Glucose-Capillary: 100 mg/dL — ABNORMAL HIGH (ref 65–99)
Glucose-Capillary: 147 mg/dL — ABNORMAL HIGH (ref 65–99)

## 2016-12-26 LAB — MRSA PCR SCREENING: MRSA BY PCR: NEGATIVE

## 2016-12-26 LAB — PROTIME-INR
INR: 1.12
Prothrombin Time: 14.3 seconds (ref 11.4–15.2)

## 2016-12-26 LAB — APTT: APTT: 23 s — AB (ref 24–36)

## 2016-12-26 LAB — VITAMIN B12: Vitamin B-12: >7500 pg/mL — ABNORMAL HIGH (ref 180–914)

## 2016-12-26 LAB — ABO/RH: ABO/RH(D): O POS

## 2016-12-26 MED ORDER — CEFTRIAXONE SODIUM 1 G IJ SOLR
1.0000 g | INTRAMUSCULAR | Status: DC
  Administered 2016-12-26: 1 g via INTRAVENOUS
  Filled 2016-12-26: qty 10

## 2016-12-26 MED ORDER — PANTOPRAZOLE SODIUM 40 MG IV SOLR
40.0000 mg | Freq: Every day | INTRAVENOUS | Status: DC
  Administered 2016-12-26: 40 mg via INTRAVENOUS
  Filled 2016-12-26: qty 40

## 2016-12-26 MED ORDER — SODIUM CHLORIDE 0.9 % IV SOLN
250.0000 mL | INTRAVENOUS | Status: DC | PRN
  Administered 2016-12-26 – 2016-12-28 (×2): 250 mL via INTRAVENOUS

## 2016-12-26 MED ORDER — ORAL CARE MOUTH RINSE
15.0000 mL | Freq: Two times a day (BID) | OROMUCOSAL | Status: DC
  Administered 2016-12-26 – 2017-01-11 (×26): 15 mL via OROMUCOSAL

## 2016-12-26 MED ORDER — OCTREOTIDE LOAD VIA INFUSION
50.0000 ug | Freq: Once | INTRAVENOUS | Status: DC
  Filled 2016-12-26 (×2): qty 25

## 2016-12-26 MED ORDER — DEXMEDETOMIDINE HCL IN NACL 400 MCG/100ML IV SOLN
0.4000 ug/kg/h | INTRAVENOUS | Status: DC
  Administered 2016-12-26: 0.7 ug/kg/h via INTRAVENOUS
  Administered 2016-12-27 (×2): 0.665 ug/kg/h via INTRAVENOUS
  Administered 2016-12-27: 0.7 ug/kg/h via INTRAVENOUS
  Administered 2016-12-27: 1 ug/kg/h via INTRAVENOUS
  Administered 2016-12-28 (×2): 1.2 ug/kg/h via INTRAVENOUS
  Filled 2016-12-26 (×7): qty 100

## 2016-12-26 MED ORDER — SODIUM CHLORIDE 0.9 % IV SOLN
50.0000 ug/h | INTRAVENOUS | Status: DC
  Administered 2016-12-26 – 2016-12-27 (×4): 50 ug/h via INTRAVENOUS
  Filled 2016-12-26 (×5): qty 1

## 2016-12-26 MED ORDER — ENOXAPARIN SODIUM 40 MG/0.4ML ~~LOC~~ SOLN
40.0000 mg | SUBCUTANEOUS | Status: DC
  Filled 2016-12-26: qty 0.4

## 2016-12-26 MED ORDER — POTASSIUM CHLORIDE IN NACL 20-0.9 MEQ/L-% IV SOLN
INTRAVENOUS | Status: DC
  Administered 2016-12-26 – 2016-12-28 (×4): via INTRAVENOUS
  Filled 2016-12-26 (×4): qty 1000

## 2016-12-26 MED ORDER — DEXMEDETOMIDINE HCL 200 MCG/2ML IV SOLN
0.4000 ug/kg/h | INTRAVENOUS | Status: DC
  Administered 2016-12-26: 0.4 ug/kg/h via INTRAVENOUS
  Filled 2016-12-26: qty 2

## 2016-12-26 NOTE — H&P (Signed)
PULMONARY / CRITICAL CARE MEDICINE   Name: [name] [name] MRN: [id number] DOB: 07/24/1940    ADMISSION DATE:  12/26/2016 CONSULTATION DATE:  12/26/2016  REFERRING MD:  [name] [name]  CHIEF COMPLAINT:  Melanotic stools in ETOH abuser  HISTORY OF PRESENT ILLNESS:   The patient is currently confused and the history is obtained from the family and records on transfer. [name] [name] is a 76 y.o. W M with a hx of significant ETOH abuse; per the family ~ 4 drinks of [name] per day and occasionally several bottles a day, who presented to [facility] with weakness, exertional dyspnea  And melanotic stools for several days. Per the family, he was seen at [facility] 2 days ago; was told his Hb was low but left AMA. He was also at [facility] 2-3 weeks ago and was intubated for ETOH withdrawal.  At [facility], his H&H was 6.5/19.7, for which he was transfused prior to transfer. Other pertinent labs were: blood alcohol <10, Ammonia-19.0, ALT/AST-12/14, bili/alk phos-0.5/70. Received MVI and thiamine prior to transfer.  GI aware of patient.  Other pertinent medical hx:  COPD: Meds-Bevespi, Spiriva, Symbicort  Atrial fib - Xarelto - family believes this was stopped  HTN - Losartan, amlodipine   PAST MEDICAL HISTORY :  He  has a past medical history of Atrial fibrillation ([place]), COPD (chronic obstructive pulmonary disease) ([place]), History of alcohol abuse, and Hypertension.  PAST SURGICAL HISTORY: He  has a past surgical history that includes No past surgeries.  No Known Allergies  No current facility-administered medications on file prior to encounter.    Current Outpatient Medications on File Prior to Encounter  Medication Sig  . albuterol (PROVENTIL) (2.5 MG/3ML) 0.083% nebulizer solution Take 2.5 mg by nebulization every 6 (six) hours as needed for wheezing or shortness of breath.  . CARTIA XT 120 MG 24 hr capsule TAKE 1 CAPSULE BY MOUTH EVERY MORNING  . cetirizine  (ZYRTEC) 10 MG tablet Take 10 mg by mouth daily.  . fluticasone (FLONASE) 50 MCG/ACT nasal spray Place 2 sprays into both nostrils daily.  . furosemide (LASIX) 40 MG tablet Take 40 mg daily as needed by mouth.  Marland Kitchen ibuprofen (ADVIL,MOTRIN) 800 MG tablet Take 1 tablet daily as needed by mouth.  . losartan (COZAAR) 100 MG tablet Take 1 tablet (100 mg total) daily by mouth.  . oxyCODONE-acetaminophen (PERCOCET/ROXICET) 5-325 MG tablet Take 1 tablet by mouth every 6 (six) hours as needed.  . potassium chloride (K-DUR) 10 MEQ tablet TK 1 T PO ONCE TO TWICE DAILY WITH FUROSEMIDE  . potassium chloride SA (K-DUR,KLOR-CON) 20 MEQ tablet Take 1 tablet daily as needed by mouth.  . SPIRIVA HANDIHALER 18 MCG inhalation capsule INL CONTENTS OF 1 C ONCE DAILY USING HANDIHALER  . SYMBICORT 160-4.5 MCG/ACT inhaler INHALE 2 PUFFS BID  . tamsulosin (FLOMAX) 0.4 MG CAPS capsule TK ONE C PO ONCE TO  BID PRF URINARY FREQUENCY  . XARELTO 20 MG TABS tablet TK 1 T PO D WITH SUPPER    FAMILY HISTORY:  His indicated that his mother is alive. He indicated that his father is deceased. He indicated that his brother is alive.   SOCIAL HISTORY: He  reports that he has been smoking cigarettes.  He started smoking about 64 years ago. He has been smoking about 2.00 packs per day. he has never used smokeless tobacco. Tobacco: 2ppp x 60 years ETOH: As above in HPI REVIEW OF SYSTEMS:   Gen: Positive for weakness  Resp: As above. No hemoptysis, chronic prod cough CV: HTN, a-fib. No hx AMI, angina GI: No hx hemetemesis, occaasional N and abdominal pain GU: No sxs of prostatism M-S: Has been c/o low back pain for which has been treating with oxycodone and ETOH Neuro: No hx seizures/syncope  SUBJECTIVE:  Confused  VITAL SIGNS: There were no vitals taken for this visit.  HEMODYNAMICS: Hemodynamically stable at present   INTAKE / OUTPUT: No intake/output data recorded.  PHYSICAL EXAMINATION: General:  WD/WN W M  NARD Neuro:  No lateralizing signs and moves all 4's. Becomes somewhat combative when being manipulated HEENT:  [place]/AT; PERRL. Refuses to open mouth on request Cardiovascular:  Irreg rhythm. No murmurs Lungs:  Bronchial BS RLL posteriorly. Otherwise clear without adventitious sounds Abdomen:  Supple, no guarding, no masses appreciated Musculoskeletal:  No joint deformities Skin:  Erythematous induration calves bilaterally - ? Early cellulitis  LABS: 12/26/2016 labs from [facility] No results for input(s): NA, K, CL, CO2, BUN, CREATININE, GLUCOSE in the last 168 hours.  NA 122, K 4.1, Cl 83, CO2 26.8, BUN 18, Cr 0.08, glu 104   Electrolytes No results for input(s): CALCIUM, MG, PHOS in the last 168 hours. Ca 8.3, Mg 2.1,  CBC No results for input(s): WBC, HGB, HCT, PLT in the last 168 hours. Pre tx: Hb 6.5, Hct 19.7, plt ct 307, WBC11.5 Coag's No results for input(s): APTT, INR in the last 168 hours.  Sepsis Markers No results for input(s): LATICACIDVEN, PROCALCITON, O2SATVEN in the last 168 hours.  ABG No results for input(s): PHART, PCO2ART, PO2ART in the last 168 hours. RA ABGs % sat 95.7, PO2 64, PCO2 43, pH 7.40  Liver Enzymes No results for input(s): AST, ALT, ALKPHOS, BILITOT, ALBUMIN in the last 168 hours. ALT/AST-12/14, bili/alk phos-0.5/70 Cardiac Enzymes No results for input(s): TROPONINI, PROBNP in the last 168 hours. Troponin <0.01, NT-Pro-BNP 2592 Glucose Recent Labs  Lab 12/26/16 1751  GLUCAP 100*    Imaging No results found. PCXR - NAD ECG A-fib (rate 80), rare PVC  STUDIES:  None  CULTURES: None  ANTIBIOTICS: None  SIGNIFICANT EVENTS: Melanotic stools  LINES/TUBES: Periph IV  DISCUSSION: 75 yo M with heavy ETOH abuse with likely UGIB and significant anemia. Lung exam suggestive of RLL consolidation. Although CXR was negative, an infiltrate might become apparent after hydration. Although ETOH level is low need to be concerned  about withdrawal.  ASSESSMENT / PLAN:  PULMONARY A: COPD with mild hypoxemia P:   Supplemental nasal O2  CARDIOVASCULAR A:  A-fib with rate controlled. Hx HTN P:  Hold prior meds for now  RENAL A:   No active problem P:   Follow  GASTROINTESTINAL A:   Likely UGIB - source to be determined P:   [facility] is aware. To keep patient NPO after MN in case need for EGD. Begin octreotide and PPI  HEMATOLOGIC A:   Fe deficiency anemia (N.B. Serum Fe 26.86) secondary to UGIB P:  Will recheck CBC this PM  INFECTIOUS A:   Mild leukocytosis. Possible cellulitis LEs P:   Will start Abx for SSTI  ENDOCRINE A:   No hx DM or thyroid disorders   P:   Will follow glu nevertheless  NEUROLOGIC A:   Altered MS - ETOH related P:   Precedex for sedation    Pulmonary and [place] Pager: [phone]  12/26/2016, 6:39 PM

## 2016-12-26 NOTE — Progress Notes (Signed)
eLink Physician-Brief Progress Note Patient Name: [name] [name] DOB: 1940/05/22 MRN: [id number]   Date of Service  12/26/2016  HPI/Events of Note  Patient admitted with GI bleed and is currently on Lovenox [place].   eICU Interventions  Will order: 1. D/C Lovenox [place]. 2. Place SCD's.     Intervention Category Major Interventions: Other:  [name] [name],[name] [name] 12/26/2016, 7:55 PM

## 2016-12-27 ENCOUNTER — Encounter (HOSPITAL_COMMUNITY): Payer: Self-pay | Admitting: Anesthesiology

## 2016-12-27 ENCOUNTER — Encounter (HOSPITAL_COMMUNITY)
Admission: AD | Disposition: A | Discharge status: Skilled Nursing Facility | Payer: Self-pay | Source: Other Acute Inpatient Hospital | Attending: Internal Medicine

## 2016-12-27 ENCOUNTER — Inpatient Hospital Stay (HOSPITAL_COMMUNITY): Payer: Medicare Other

## 2016-12-27 ENCOUNTER — Encounter (HOSPITAL_COMMUNITY): Payer: Self-pay | Admitting: *Deleted

## 2016-12-27 DIAGNOSIS — K7030 Alcoholic cirrhosis of liver without ascites: Secondary | ICD-10-CM

## 2016-12-27 DIAGNOSIS — K257 Chronic gastric ulcer without hemorrhage or perforation: Secondary | ICD-10-CM

## 2016-12-27 DIAGNOSIS — K921 Melena: Secondary | ICD-10-CM

## 2016-12-27 DIAGNOSIS — K2970 Gastritis, unspecified, without bleeding: Secondary | ICD-10-CM

## 2016-12-27 DIAGNOSIS — D62 Acute posthemorrhagic anemia: Secondary | ICD-10-CM

## 2016-12-27 DIAGNOSIS — K922 Gastrointestinal hemorrhage, unspecified: Secondary | ICD-10-CM

## 2016-12-27 HISTORY — PX: ESOPHAGOGASTRODUODENOSCOPY: SHX5428

## 2016-12-27 LAB — URINALYSIS, MICROSCOPIC (REFLEX): SQUAMOUS EPITHELIAL / LPF: NONE SEEN

## 2016-12-27 LAB — CBC
HCT: 22.4 % — ABNORMAL LOW (ref 39.0–52.0)
HCT: 25.3 % — ABNORMAL LOW (ref 39.0–52.0)
HEMATOCRIT: 21.7 % — AB (ref 39.0–52.0)
HEMATOCRIT: 24.5 % — AB (ref 39.0–52.0)
Hemoglobin: 7 g/dL — ABNORMAL LOW (ref 13.0–17.0)
Hemoglobin: 7.2 g/dL — ABNORMAL LOW (ref 13.0–17.0)
Hemoglobin: 7.7 g/dL — ABNORMAL LOW (ref 13.0–17.0)
Hemoglobin: 8 g/dL — ABNORMAL LOW (ref 13.0–17.0)
MCH: 30.8 pg (ref 26.0–34.0)
MCH: 31.2 pg (ref 26.0–34.0)
MCH: 30.3 pg (ref 26.0–34.0)
MCH: 30.9 pg (ref 26.0–34.0)
MCHC: 32.3 g/dL (ref 30.0–36.0)
MCHC: 32.1 g/dL (ref 30.0–36.0)
MCHC: 31.4 g/dL (ref 30.0–36.0)
MCHC: 31.6 g/dL (ref 30.0–36.0)
MCV: 95.6 fL (ref 78.0–100.0)
MCV: 97 fL (ref 78.0–100.0)
MCV: 96.5 fL (ref 78.0–100.0)
MCV: 97.7 fL (ref 78.0–100.0)
PLATELETS: 241 10*3/uL (ref 150–400)
PLATELETS: 240 10*3/uL (ref 150–400)
Platelets: 268 10*3/uL (ref 150–400)
Platelets: 241 10*3/uL (ref 150–400)
RBC: 2.27 MIL/uL — ABNORMAL LOW (ref 4.22–5.81)
RBC: 2.31 MIL/uL — ABNORMAL LOW (ref 4.22–5.81)
RBC: 2.54 MIL/uL — ABNORMAL LOW (ref 4.22–5.81)
RBC: 2.59 MIL/uL — ABNORMAL LOW (ref 4.22–5.81)
RDW: 18.6 % — AB (ref 11.5–15.5)
RDW: 18.8 % — AB (ref 11.5–15.5)
RDW: 18.5 % — AB (ref 11.5–15.5)
RDW: 18.6 % — AB (ref 11.5–15.5)
WBC: 9.7 10*3/uL (ref 4.0–10.5)
WBC: 7.5 10*3/uL (ref 4.0–10.5)
WBC: 11.4 10*3/uL — AB (ref 4.0–10.5)
WBC: 13.1 10*3/uL — ABNORMAL HIGH (ref 4.0–10.5)

## 2016-12-27 LAB — GLUCOSE, CAPILLARY
GLUCOSE-CAPILLARY: 142 mg/dL — AB (ref 65–99)
GLUCOSE-CAPILLARY: 87 mg/dL (ref 65–99)
Glucose-Capillary: 125 mg/dL — ABNORMAL HIGH (ref 65–99)
Glucose-Capillary: 98 mg/dL (ref 65–99)
Glucose-Capillary: 86 mg/dL (ref 65–99)

## 2016-12-27 LAB — PROCALCITONIN: Procalcitonin: <0.1 ng/mL

## 2016-12-27 LAB — URINALYSIS, ROUTINE W REFLEX MICROSCOPIC
GLUCOSE, UA: NEGATIVE mg/dL
Ketones, ur: NEGATIVE mg/dL
LEUKOCYTES UA: NEGATIVE
Nitrite: NEGATIVE
Protein, ur: NEGATIVE mg/dL
SPECIFIC GRAVITY, URINE: 1.02 (ref 1.005–1.030)
pH: 6 (ref 5.0–8.0)

## 2016-12-27 LAB — MAGNESIUM: Magnesium: 2.2 mg/dL (ref 1.7–2.4)

## 2016-12-27 LAB — BASIC METABOLIC PANEL
Anion gap: 7 (ref 5–15)
BUN: 10 mg/dL (ref 6–20)
CHLORIDE: 99 mmol/L — AB (ref 101–111)
CO2: 26 mmol/L (ref 22–32)
CREATININE: 0.95 mg/dL (ref 0.61–1.24)
Calcium: 7.6 mg/dL — ABNORMAL LOW (ref 8.9–10.3)
GFR calc Af Amer: >60 mL/min (ref >60)
GLUCOSE: 147 mg/dL — AB (ref 65–99)
POTASSIUM: 4.1 mmol/L (ref 3.5–5.1)
Sodium: 132 mmol/L — ABNORMAL LOW (ref 135–145)

## 2016-12-27 LAB — PHOSPHORUS: Phosphorus: 4.1 mg/dL (ref 2.5–4.6)

## 2016-12-27 SURGERY — EGD (ESOPHAGOGASTRODUODENOSCOPY)
Anesthesia: Moderate Sedation

## 2016-12-27 MED ORDER — PIPERACILLIN-TAZOBACTAM 3.375 G IVPB
3.3750 g | Freq: Three times a day (TID) | INTRAVENOUS | Status: DC
  Administered 2016-12-27 – 2016-12-28 (×3): 3.375 g via INTRAVENOUS
  Filled 2016-12-27 (×4): qty 50

## 2016-12-27 MED ORDER — MIDAZOLAM HCL 5 MG/ML IJ SOLN
INTRAMUSCULAR | Status: AC
  Filled 2016-12-27: qty 2

## 2016-12-27 MED ORDER — VANCOMYCIN HCL 10 G IV SOLR
1250.0000 mg | Freq: Two times a day (BID) | INTRAVENOUS | Status: DC
  Administered 2016-12-27 (×2): 1250 mg via INTRAVENOUS
  Filled 2016-12-27 (×3): qty 1250

## 2016-12-27 MED ORDER — FENTANYL CITRATE (PF) 100 MCG/2ML IJ SOLN
INTRAMUSCULAR | Status: DC | PRN
  Administered 2016-12-27 (×2): 25 ug via INTRAVENOUS

## 2016-12-27 MED ORDER — MIDAZOLAM HCL 10 MG/2ML IJ SOLN
INTRAMUSCULAR | Status: DC | PRN
  Administered 2016-12-27: 2 mg via INTRAVENOUS
  Administered 2016-12-27 (×2): 1 mg via INTRAVENOUS

## 2016-12-27 MED ORDER — NICOTINE 21 MG/24HR TD PT24
21.0000 mg | MEDICATED_PATCH | Freq: Every day | TRANSDERMAL | Status: DC
  Administered 2016-12-27 – 2017-01-17 (×22): 21 mg via TRANSDERMAL
  Filled 2016-12-27 (×23): qty 1

## 2016-12-27 MED ORDER — FENTANYL CITRATE (PF) 100 MCG/2ML IJ SOLN
INTRAMUSCULAR | Status: AC
  Filled 2016-12-27: qty 2

## 2016-12-27 MED ORDER — PANTOPRAZOLE SODIUM 40 MG IV SOLR
40.0000 mg | Freq: Two times a day (BID) | INTRAVENOUS | Status: DC
  Administered 2016-12-27 – 2016-12-28 (×3): 40 mg via INTRAVENOUS
  Filled 2016-12-27 (×3): qty 40

## 2016-12-27 NOTE — Progress Notes (Signed)
PULMONARY / CRITICAL CARE MEDICINE   Name: [name] [name] MRN: [id number] DOB: 07/24/1940    ADMISSION DATE:  12/26/2016 CONSULTATION DATE:  12/26/2016  REFERRING MD:  [name] [name]  CHIEF COMPLAINT:  Melanotic stools in ETOH abuser  HISTORY OF PRESENT ILLNESS:   The patient is currently confused and the history is obtained from the family and records on transfer. [name] [name] is a 76 y.o. W M with a hx of significant ETOH abuse; per the family ~ 4 drinks of [name] per day and occasionally several bottles a day, who presented to [facility] with weakness, exertional dyspnea  And melanotic stools for several days. Per the family, he was seen at [facility] 2 days ago; was told his Hb was low but left AMA. He was also at [facility] 2-3 weeks ago and was intubated for ETOH withdrawal.  At [facility], his H&H was 6.5/19.7, for which he was transfused prior to transfer. Other pertinent labs were: blood alcohol <10, Ammonia-19.0, ALT/AST-12/14, bili/alk phos-0.5/70. Received MVI and thiamine prior to transfer.  GI aware of patient.  Other pertinent medical hx:  COPD: Meds-Bevespi, Spiriva, Symbicort  Atrial fib - Xarelto - family believes this was stopped  HTN - Losartan, amlodipine   PAST MEDICAL HISTORY :  He  has a past medical history of Atrial fibrillation ([place]), COPD (chronic obstructive pulmonary disease) ([place]), History of alcohol abuse, and Hypertension.  PAST SURGICAL HISTORY: He  has a past surgical history that includes No past surgeries.  No Known Allergies  No current facility-administered medications on file prior to encounter.    Current Outpatient Medications on File Prior to Encounter  Medication Sig  . albuterol (PROVENTIL) (2.5 MG/3ML) 0.083% nebulizer solution Take 2.5 mg by nebulization every 6 (six) hours as needed for wheezing or shortness of breath.  . CARTIA XT 120 MG 24 hr capsule TAKE 1 CAPSULE BY MOUTH EVERY MORNING  . cetirizine  (ZYRTEC) 10 MG tablet Take 10 mg by mouth daily.  . fluticasone (FLONASE) 50 MCG/ACT nasal spray Place 2 sprays into both nostrils daily.  . furosemide (LASIX) 40 MG tablet Take 40 mg daily as needed by mouth.  Marland Kitchen ibuprofen (ADVIL,MOTRIN) 800 MG tablet Take 1 tablet daily as needed by mouth.  . losartan (COZAAR) 100 MG tablet Take 1 tablet (100 mg total) daily by mouth.  . oxyCODONE-acetaminophen (PERCOCET/ROXICET) 5-325 MG tablet Take 1 tablet by mouth every 6 (six) hours as needed.  . potassium chloride (K-DUR) 10 MEQ tablet TK 1 T PO ONCE TO TWICE DAILY WITH FUROSEMIDE  . potassium chloride SA (K-DUR,KLOR-CON) 20 MEQ tablet Take 1 tablet daily as needed by mouth.  . SPIRIVA HANDIHALER 18 MCG inhalation capsule INL CONTENTS OF 1 C ONCE DAILY USING HANDIHALER  . SYMBICORT 160-4.5 MCG/ACT inhaler INHALE 2 PUFFS BID  . tamsulosin (FLOMAX) 0.4 MG CAPS capsule TK ONE C PO ONCE TO  BID PRF URINARY FREQUENCY  . XARELTO 20 MG TABS tablet TK 1 T PO D WITH SUPPER    FAMILY HISTORY:  His indicated that his mother is alive. He indicated that his father is deceased. He indicated that his brother is alive.   SOCIAL HISTORY: He  reports that he has been smoking cigarettes.  He started smoking about 64 years ago. He has been smoking about 2.00 packs per day. he has never used smokeless tobacco. Tobacco: 2ppp x 60 years ETOH: As above in HPI REVIEW OF SYSTEMS:   Gen: Positive for weakness  Resp: As above. No hemoptysis, chronic prod cough CV: HTN, a-fib. No hx AMI, angina GI: No hx hemetemesis, occaasional N and abdominal pain GU: No sxs of prostatism M-S: Has been c/o low back pain for which has been treating with oxycodone and ETOH Neuro: No hx seizures/syncope  SUBJECTIVE:  Stable overnight. Continues to have tarry stools.   VITAL SIGNS: BP (!) 112/52   Pulse 66   Temp 98.3 F (36.8 C) (Oral)   Resp 18   Wt 202 lb 9.6 oz (91.9 kg)   SpO2 100%   BMI 31.73 kg/m   HEMODYNAMICS:  Hemodynamically stable at present   INTAKE / OUTPUT: I/O last 3 completed shifts: In: 1480.6 [I.V.:1430.6; IV Piggyback:50] Out: 1325 [Urine:1325]  PHYSICAL EXAMINATION: Gen:      No acute distress HEENT:  EOMI, sclera anicteric Neck:     No masses; no thyromegaly Lungs:    Clear to auscultation bilaterally; normal respiratory effort CV:         Regular rate and rhythm; no murmurs Abd:      + bowel sounds; soft, non-tender; no palpable masses, no distension Ext:    No edema; adequate peripheral perfusion Skin:      Warm and dry; no rash Neuro: alert and oriented x 3 Psych: normal mood and affect  General:  WD/WN W M NARD Neuro:  No lateralizing signs and moves all 4's. Becomes somewhat combative when being manipulated HEENT:  [place]/AT; PERRL. Refuses to open mouth on request Cardiovascular:  Irreg rhythm. No murmurs Lungs:  Bronchial BS RLL posteriorly. Otherwise clear without adventitious sounds Abdomen:  Supple, no guarding, no masses appreciated Musculoskeletal:  No joint deformities Skin:  Erythematous induration calves bilaterally - ? Early cellulitis  LABS: 12/26/2016 labs from [place]  Lab 12/27/16 0154  NA 132*  K 4.1  CL 99*  CO2 26  BUN 10  CREATININE 0.95  GLUCOSE 147*    NA 122, K 4.1, Cl 83, CO2 26.8, BUN 18, Cr 0.08, glu 104   Electrolytes Recent Labs  Lab 12/27/16 0154  CALCIUM 7.6*  MG 2.2  PHOS 4.1   Ca 8.3, Mg 2.1,  CBC Recent Labs  Lab 12/27/16 0154 12/27/16 0742  WBC 9.7 7.5  HGB 7.0* 7.2*  HCT 21.7* 22.4*  PLT 268 241   Pre tx: Hb 6.5, Hct 19.7, plt ct 307, WBC11.5 Coag's Recent Labs  Lab 12/26/16 2005  APTT 23*  INR 1.12    Sepsis Markers No results for input(s): LATICACIDVEN, PROCALCITON, O2SATVEN in the last 168 hours.  ABG No results for input(s): PHART, PCO2ART, PO2ART in the last 168 hours. RA ABGs % sat 95.7, PO2 64, PCO2 43, pH 7.40  Liver Enzymes No results for input(s): AST, ALT,  ALKPHOS, BILITOT, ALBUMIN in the last 168 hours. ALT/AST-12/14, bili/alk phos-0.5/70 Cardiac Enzymes No results for input(s): TROPONINI, PROBNP in the last 168 hours. Troponin <0.01, NT-Pro-BNP 2592 Glucose Recent Labs  Lab 12/26/16 1751 12/26/16 2032 12/26/16 2335 12/27/16 0429 12/27/16 0724  GLUCAP 100* 126* 147* 142* 125*    Imaging Dg Chest Port 1 View  Result Date: 12/27/2016 CLINICAL DATA:  76 year old male with hypoxia and shortness breath. Subsequent encounter. EXAM: PORTABLE CHEST 1 VIEW COMPARISON:  12/26/2016 and 06/09/2016 chest x-ray. FINDINGS: Cardiomegaly with pulmonary vascular congestion and Kerley B lines suggesting mild pulmonary edema. No pneumothorax. Calcified aorta. IMPRESSION: Cardiomegaly. Mild pulmonary edema. Aortic Atherosclerosis (ICD10-I70.0). Electronically Signed   By: Alcide Evener.D.  On: 12/27/2016 07:16   PCXR - NAD ECG A-fib (rate 80), rare PVC  STUDIES:  None  CULTURES: None  ANTIBIOTICS: None  SIGNIFICANT EVENTS: Melanotic stools  LINES/TUBES: Periph IV  DISCUSSION: 76 yo M with heavy ETOH abuse with likely UGIB and significant anemia. Lung exam suggestive of RLL consolidation. Although CXR was negative, an infiltrate might become apparent after hydration. Although ETOH level is low need to be concerned about withdrawal.  ASSESSMENT / PLAN:  PULMONARY A: COPD with mild hypoxemia P:   Wean down o2 as tolerated.   CARDIOVASCULAR A:  A-fib with rate controlled. Hx HTN P:  Holding home meds  RENAL A:   No active problem P:   Monitor urine output and cr.   GASTROINTESTINAL A:   Likely UGIB - source to be determined P:   GI consult Continue octreotide, PPI bid Keep NPO Follow Hb. Transfuse for Hb < 7  HEMATOLOGIC A:   Fe deficiency anemia (N.B. Serum Fe 26.86) secondary to UGIB P:  Follow CBC  INFECTIOUS A:   Mild leukocytosis. Possible cellulitis LEs P:   Will start Abx for SSTI Broaden to vanco,  zosyn pending cultures Follow Cx. Check Pct Would care consult to evaluate sacral and thigh wound  ENDOCRINE A:   No hx DM or thyroid disorders   P:   Follow blood sugars  NEUROLOGIC A:   Altered MS - ETOH related P:   Precedex for sedation  The patient is critically ill with multiple organ system failure and requires high complexity decision making for assessment and support, frequent evaluation and titration of therapies, advanced monitoring, review of radiographic studies and interpretation of complex data.   Critical Care Time devoted to patient care services, exclusive of separately billable procedures, described in this note is 35 minutes.   Marshell Garfinkel MD [facility] Pager [phone] If no answer or after 3pm call: [phone] 12/27/2016, 9:15 AM

## 2016-12-27 NOTE — Progress Notes (Signed)
Pharmacy Antibiotic Note  [name] [name] is a 76 y.o. male admitted on 12/26/2016 with possible wound infection  Plan: Dc ctx Add zosyn 3.375 gm iv q8h Add Vanc 1250 q12  Weight: 202 lb 9.6 oz (91.9 kg)  Temp (24hrs), Avg:97.9 F (36.6 C), Min:97.3 F (36.3 C), Max:98.3 F (36.8 C)  Recent Labs  Lab 12/27/16 0154 12/27/16 0742  WBC 9.7 7.5  CREATININE 0.95  --     Estimated Creatinine Clearance: 71.5 mL/min (by C-G formula based on SCr of 0.95 mg/dL).    No Known Allergies  [name] BlissMichael Louisa [name], PharmD, BCPS, BCCCP Clinical Pharmacist Clinical phone for 12/27/2016 from 7a-3:30p: [phone] If after 3:30p, please call main pharmacy at: [phone] 12/27/2016 9:22 AM

## 2016-12-27 NOTE — Consult Note (Addendum)
WOC Nurse wound consult note Reason for Consult: Consult requested for bilat buttocks and sacrum.  Affected area is approx 7X6cm; dark reddish purple but the appearance is more consistent with bruising instead of deep tissue injury.  There are 3 full thickness wounds in the area; each is shallow and narrow; approx .2X.2X.1cm, all surrounding rectum, from unknown etiology.  They are red and moist, small amt pink drainage, no odor or fluctuance when probed with a swab.  These are NOT pressure injuries. Pt is frequently incontinent of liquid tarry stools and it is difficult to keep the wounds from becoming soiled.   Dressing procedure/placement/frequency: Foam dressing to attempt to protect and promote healing. Please re-consult if further assistance is needed.  Thank-you,  Cammie Mcgeeawn Harles Evetts MSN, RN, CWOCN, [place]AP, CNS [phone]

## 2016-12-27 NOTE — Consult Note (Signed)
[facility] Consult: 8:25 AM 12/27/2016  LOS: 1 day    Referring Provider: Dr Roselyn Bering  Primary Care Physician:  Ardyth Man, MD Primary Gastroenterologist:   [facility]    Reason for Consultation:  Melenic stools, anemia in alcoholic.    HPI: [name] [name] is a 76 y.o. male.  PMH alcoholism.  Cirrhosis.  COPD.  Htn. A fib/flutter, Xarelto on hold since before 12/6.  B12 deficiency.  Previous Colonoscopy in [place], no polyps, but not EGD   Admision to [facility] 11/22 - 12/16/16.  Required intubation for resp failure, ETOH withdrawal, delerium.  + AKI.  Hyponatremia.  Volume overload.   Reported 2 weeks of melenic stools and was FOBT + with Hgb 8.6.  Platelets 139.    Psych eval for suicidal ideation, deemed not a threat for suicide and did not meet inpt psych criteria.  Started on Finasteride for BPH.   Plan was GI follow up (appt 03/21/16 3 PM with [facility]) to plan EGD.  Xarelto held, plan was restart if Hgb >8 (was 8.1 on discharge date 12/6).  On Protonix 40 mg, Lasix 40 daily at discharge.   Head CT unremarkable.   ANA IgG <1:80.  Hep B and C negative, Hep A IgG and IgM positive.  Ceruloplasmin 24.4 (rr is 19 to 31).  H Pylori Ab was negative.      12/02/17 abd ultrasound  1. Cirrhotic liver morphology with heterogeneous liver parenchyma, coarsened liver echotexture and areas of subtle liver surface nodularity. Please note that the ultrasound sensitivity for detecting focal lesions is decreased in the background of cirrhosis. If there is a clinical concern for focal liver lesion (like elevated serum AFP levels) a triple phase CT or abdominal MRI should be performed for screening. 2. Patent portal vein and hepatic veins with antegrade flow.  Seen in [facility] ED 2/14 and told he was anemic.  He left  AMA Present to [facility] with Hgb 6.4, transfused PRBC 2 U.  ETOH <10.  Octreotide gtt,  Protonix IV BID, Rocephin initiated.      Mild pulm edema and CM per cxr.     Hgb this AM is 7.2.   Denies abd pain or n/v.  Endorses anorexia.  No unusual bleeding.  Bruises easily.  Streaking red, painless LE swelling is not new.  Endorses SOB.  Takes 400 mg Ibuprofen daily.     Past Medical History:  Diagnosis Date  . Atrial fibrillation (HCC)   . COPD (chronic obstructive pulmonary disease) (H[facility])   . History of alcohol abuse   . Hypertension     Past Surgical History:  Procedure Laterality Date  . NO PAST SURGERIES      Prior to Admission medications   Medication Sig Start Date End Date Taking? Authorizing Provider  albuterol (PROVENTIL) (2.5 MG/3ML) 0.083% nebulizer solution Take 2.5 mg by nebulization every 6 (six) hours as needed for wheezing or shortness of breath.    [provider]  CARTIA XT 120 MG 24 hr capsule TAKE 1 CAPSULE BY  MOUTH EVERY MORNING 07/05/16   Laqueta Linden, MD  cetirizine (ZYRTEC) 10 MG tablet Take 10 mg by mouth daily.    [provider]  fluticasone (FLONASE) 50 MCG/ACT nasal spray Place 2 sprays into both nostrils daily.    [provider]  furosemide (LASIX) 40 MG tablet Take 40 mg daily as needed by mouth.    [provider]  ibuprofen (ADVIL,MOTRIN) 800 MG tablet Take 1 tablet daily as needed by mouth. 09/27/16   [provider]  losartan (COZAAR) 100 MG tablet Take 1 tablet (100 mg total) daily by mouth. 11/15/16   Laqueta Linden, MD  oxyCODONE-acetaminophen (PERCOCET/ROXICET) 5-325 MG tablet Take 1 tablet by mouth every 6 (six) hours as needed. 07/06/16   [provider]  potassium chloride (K-DUR) 10 MEQ tablet TK 1 T PO ONCE TO TWICE DAILY WITH FUROSEMIDE 05/27/16   [provider]  potassium chloride SA (K-DUR,KLOR-CON) 20 MEQ tablet Take 1 tablet daily as needed by mouth. 08/18/16    [provider]  SPIRIVA HANDIHALER 18 MCG inhalation capsule INL CONTENTS OF 1 C ONCE DAILY USING HANDIHALER 06/12/16   [provider]  SYMBICORT 160-4.5 MCG/ACT inhaler INHALE 2 PUFFS BID 05/20/16   [provider]  tamsulosin (FLOMAX) 0.4 MG CAPS capsule TK ONE C PO ONCE TO  BID PRF URINARY FREQUENCY 05/12/16   [provider]  XARELTO 20 MG TABS tablet HELD TK 1 T PO D WITH SUPPER 06/12/16   [provider]    Scheduled Meds: . mouth rinse  15 mL Mouth Rinse BID  . nicotine  21 mg Transdermal Daily  . octreotide  50 mcg Intravenous Once  . pantoprazole (PROTONIX) IV  40 mg Intravenous Q12H   Infusions: . sodium chloride 250 mL (12/27/16 0600)  . 0.9 % NaCl with KCl 20 mEq / L 100 mL/hr at 12/27/16 0600  . cefTRIAXone (ROCEPHIN)  IV Stopped (12/26/16 2310)  . dexmedetomidine (PRECEDEX) IV infusion 0.702 mcg/kg/hr (12/27/16 0600)  . octreotide  (SANDOSTATIN)    IV infusion 50 mcg/hr (12/27/16 0600)   PRN Meds: sodium chloride   Allergies as of 12/26/2016  . (No Known Allergies)    Family History  Problem Relation Age of Onset  . Arrhythmia Brother   . Atrial fibrillation Brother     Social History   Socioeconomic History  . Marital status: Married                                              Tobacco Use  . Smoking status: Current Every Day Smoker    Packs/day: 2.00    Types: Cigarettes    Start date: 11/07/1952  . Smokeless tobacco: Never Used    REVIEW OF SYSTEMS: Constitutional:  Weakness.  C/o thirst.   ENT:  No nose bleeds Pulm:  SOB CV:  No palpitations, no chest pain GU:  No hematuria, no frequency GI:  Per HPI Heme:  Per HPI   Transfusions:  Per HPI Neuro:  No headaches, no peripheral tingling or numbness Derm:  No itching, no rash or sores.  Endocrine:  No sweats or chills.  No polyuria or dysuria Immunization:  Not queried Travel:  None beyond local counties in last few months.    PHYSICAL  EXAM: Vital signs in last 24 hours: Vitals:   12/27/16 0600 12/27/16 1610  BP: (!) 112/52   Pulse: 66   Resp: 18   Temp:  98.3 F (36.8 C)  SpO2: 100%    Wt Readings from Last 3 Encounters:  12/27/16 91.9 kg (202 lb 9.6 oz)  11/15/16 97 kg (213 lb 12.8 oz)  08/12/16 93.9 kg (207 lb)    General: ill looking, obese WM who awakens easily frim rest Head:  No assymetry or swelling  Eyes:  No icterus.  + conj pallor Ears:  Slightly HOH  Nose: no discharge   Mouth:  slighlty dry MM.  Tongue midline Neck:  No mass or JVD Lungs:  Clear in front.  Raspy voice.   Heart: Irreg, irreg in 60s.   Abdomen:  Obese, soft, umbilical hernia.  Active BS.  NT.   Rectal: greenish, liquid, dark stools.  Not melenic.     Musc/Skeltl: no gross joint deformities.   Extremities:  + red streaking and swelling in both lower legs to top of feet, not TTT.  Cap refill slightly slow.    Neurologic:  Alert, follows commands.  Oriented to self, "[name] [name]", [name] [name].  No tremor or asterixis but both hands in mitten and tie-down restraints Skin:  No telangectasia.   Tattoos:  None seen    Psych:  Cooperative, calm.    Intake/Output from previous day: 12/16 0701 - 12/17 0700 In: 1480.6 [I.V.:1430.6; IV Piggyback:50] Out: 1325 [Urine:1325] Intake/Output this shift: No intake/output data recorded.  LAB RESULTS: Recent Labs    12/27/16 0154 12/27/16 0742  WBC 9.7 7.5  HGB 7.0* 7.2*  HCT 21.7* 22.4*  PLT 268 241   BMET Lab Results  Component Value Date   NA 132 (L) 12/27/2016   K 4.1 12/27/2016   CL 99 (L) 12/27/2016   CO2 26 12/27/2016   GLUCOSE 147 (H) 12/27/2016   BUN 10 12/27/2016   CREATININE 0.95 12/27/2016   CALCIUM 7.6 (L) 12/27/2016   PT/INR Lab Results  Component Value Date   INR 1.12 12/26/2016    RADIOLOGY STUDIES: Dg Chest Port 1 View  Result Date: 12/27/2016 CLINICAL DATA:  76 year old male with hypoxia and shortness breath. Subsequent encounter. EXAM: PORTABLE CHEST 1 VIEW  COMPARISON:  12/26/2016 and 06/09/2016 chest x-ray. FINDINGS: Cardiomegaly with pulmonary vascular congestion and Kerley B lines suggesting mild pulmonary edema. No pneumothorax. Calcified aorta. IMPRESSION: Cardiomegaly. Mild pulmonary edema. Aortic Atherosclerosis (ICD10-I70.0). Electronically Signed   By: Lacy DuverneySteven  Olson M.D.   On: 12/27/2016 07:16    IMPRESSION:   *  GI bleed in pt with recent dx of cirrhosis.  R/o varices, portal htn, NSAID ulcers.    *  Normocytic anemia.  Dx B12 deficient within last 3 weeks.  RX oral B12.  Level was 77 on 12/02/16, now 7500.      S/p PRBC x 2 in [place].    *  Cirrhosis due to ETOH.  Not coagulopathic.  Do not see latest LFTs but [facility]    *  Hyponatremia.      PLAN:     *  Needs EGD, discussed with pt who is agreeable to proceed.  Although he is somewhat confused he understands what EGD involves.      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 Needs EGD  I find his mental status to be clear and he understands risk, benefit of EGD  Will do that later today at  bedside.  Iva Booparl E. Gessner, MD, [facility] [phone] (pager) 12/27/2016 11:45 AM

## 2016-12-27 NOTE — Brief Op Note (Signed)
12/26/2016 - 12/27/2016  3:19 PM  PATIENT:  [name] [name]  76 y.o. male  PRE-OPERATIVE DIAGNOSIS:  melenic stool, anemia, alcoholic cirrhottic.  POST-OPERATIVE DIAGNOSIS:  Chronic antral ulcer PROCEDURE:  Procedure(s): ESOPHAGOGASTRODUODENOSCOPY (EGD) (N/A)  SURGEON:  Surgeon(s) and Role:    Iva Boop* Cruzito Standre E, MD - Primary  ANESTHESIA:   IV sedation Fentanyl 50 ug and Versed 4 mg IV  Findings:  1 cm clean-based antral ulcer near incisura - some surrounding edema. Biopsies taken.  All else NL - no signs of varices or other portal htn  Plan  DC octreotide IV bid PPI Drink/feed when mental status allows  Await biopsy results - looks benign

## 2016-12-27 NOTE — Op Note (Signed)
[facility] Patient Name: [name] BroachJerry [name] Procedure Date : 12/27/2016 MRN: [id number] Attending MD: Iva Booparl E Alic Hilburn , MD Date of Birth: 1940-09-05 CSN: [id number] Age: [id number] Admit Type: Inpatient Procedure:                Upper GI endoscopy Indications:              Melena Providers:                Iva Booparl E. Keller Bounds, MD, Dwain SarnaPatricia Ford, RN, Zoila ShutterGary                            Bryant, Technician Referring MD:              Medicines:                Fentanyl 50 micrograms IV, Midazolam 5 mg IV Complications:            No immediate complications. Estimated Blood Loss:     Estimated blood loss was minimal. Procedure:                Pre-Anesthesia Assessment:                           - Prior to the procedure, a History and Physical                            was performed, and patient medications and                            allergies were reviewed. The patient's tolerance of                            previous anesthesia was also reviewed. The risks                            and benefits of the procedure and the sedation                            options and risks were discussed with the patient.                            All questions were answered, and informed consent                            was obtained. Prior Anticoagulants: The patient has                            taken no previous anticoagulant or antiplatelet                            agents. ASA Grade Assessment: III - A patient with                            severe systemic disease. After reviewing the risks  and benefits, the patient was deemed in                            satisfactory condition to undergo the procedure.                           After obtaining informed consent, the endoscope was                            passed under direct vision. Throughout the                            procedure, the patient's blood pressure, pulse, and                            oxygen  saturations were monitored continuously. The                            EG-[id number] ([id number]) scope was introduced through the                            mouth, and advanced to the second part of duodenum.                            The upper GI endoscopy was accomplished without                            difficulty. The patient tolerated the procedure                            well. Scope In: Scope Out: Findings:      One non-bleeding cratered gastric ulcer with no stigmata of bleeding was       found in the prepyloric region of the stomach. The lesion was 10 mm in       largest dimension. Biopsies were taken with a cold forceps for       histology. Verification of patient identification for the specimen was       done. Estimated blood loss was minimal.      The exam was otherwise without abnormality.      The cardia and gastric fundus were normal on retroflexion. Impression:               - Non-bleeding gastric ulcer with no stigmata of                            bleeding. Biopsied.                           - The examination was otherwise normal. Moderate Sedation:      Moderate (conscious) sedation was administered by the endoscopy nurse       and supervised by the endoscopist. The following parameters were       monitored: oxygen saturation, heart rate, blood pressure, and response       to care. Total physician intraservice time was 10 minutes. Recommendation:           -  Return patient to ICU for ongoing care.                           - NPO - advance as tolerated to resume previous                            diet.                           - Continue present medications.                           - DC octreotide as no signs portal htn                           Start feeding when alert enough                           Should be able to move out of ICU soon Procedure Code(s):        --- Professional ---                           [id number], Esophagogastroduodenoscopy, flexible,                             transoral; with biopsy, single or multiple                           G0500, Moderate sedation services provided by the                            same physician or other qualified health care                            professional performing a gastrointestinal                            endoscopic service that sedation supports,                            requiring the presence of an independent trained                            observer to assist in the monitoring of the                            patient's level of consciousness and physiological                            status; initial 15 minutes of intra-service time;                            patient age 16 years or older (additional time 87  be reported with [id number], as appropriate) Diagnosis Code(s):        --- Professional ---                           K25.9, Gastric ulcer, unspecified as acute or                            chronic, without hemorrhage or perforation                           K92.1, Melena (includes Hematochezia) CPT copyright 2016 American Medical Association. All rights reserved. The codes documented in this report are preliminary and upon coder review may  be revised to meet current compliance requirements. Iva Booparl E Tianna Baus, MD 12/27/2016 3:37:57 PM This report has been signed electronically. Number of Addenda: 0

## 2016-12-28 ENCOUNTER — Inpatient Hospital Stay (HOSPITAL_COMMUNITY): Payer: Medicare Other

## 2016-12-28 DIAGNOSIS — K921 Melena: Secondary | ICD-10-CM

## 2016-12-28 LAB — CBC
HCT: 25 % — ABNORMAL LOW (ref 39.0–52.0)
HEMOGLOBIN: 7.8 g/dL — AB (ref 13.0–17.0)
MCH: 30.6 pg (ref 26.0–34.0)
MCHC: 31.2 g/dL (ref 30.0–36.0)
MCV: 98 fL (ref 78.0–100.0)
PLATELETS: 221 10*3/uL (ref 150–400)
RBC: 2.55 MIL/uL — AB (ref 4.22–5.81)
RDW: 18.5 % — ABNORMAL HIGH (ref 11.5–15.5)
WBC: 11.1 10*3/uL — AB (ref 4.0–10.5)

## 2016-12-28 LAB — PHOSPHORUS: Phosphorus: 3.4 mg/dL (ref 2.5–4.6)

## 2016-12-28 LAB — PROCALCITONIN: Procalcitonin: <0.1 ng/mL

## 2016-12-28 LAB — FOLATE RBC
FOLATE, HEMOLYSATE: 581.9 ng/mL
Folate, RBC: 2924 ng/mL (ref >498)
Hematocrit: 19.9 % — ABNORMAL LOW (ref 37.5–51.0)

## 2016-12-28 LAB — URINE CULTURE: Culture: NO GROWTH

## 2016-12-28 LAB — BASIC METABOLIC PANEL
Anion gap: 7 (ref 5–15)
BUN: 13 mg/dL (ref 6–20)
CHLORIDE: 106 mmol/L (ref 101–111)
CO2: 23 mmol/L (ref 22–32)
Calcium: 7.8 mg/dL — ABNORMAL LOW (ref 8.9–10.3)
Creatinine, Ser: 1.02 mg/dL (ref 0.61–1.24)
GFR calc Af Amer: >60 mL/min (ref >60)
GFR calc non Af Amer: >60 mL/min (ref >60)
GLUCOSE: 92 mg/dL (ref 65–99)
POTASSIUM: 5 mmol/L (ref 3.5–5.1)
Sodium: 136 mmol/L (ref 135–145)

## 2016-12-28 LAB — MAGNESIUM: Magnesium: 2.1 mg/dL (ref 1.7–2.4)

## 2016-12-28 MED ORDER — FOLIC ACID 5 MG/ML IJ SOLN
1.0000 mg | Freq: Every day | INTRAMUSCULAR | Status: DC
  Administered 2016-12-28: 1 mg via INTRAVENOUS
  Filled 2016-12-28: qty 0.2

## 2016-12-28 MED ORDER — LORAZEPAM 1 MG PO TABS
1.0000 mg | ORAL_TABLET | ORAL | Status: DC | PRN
  Administered 2016-12-29 – 2017-01-02 (×4): 1 mg via ORAL
  Filled 2016-12-28 (×4): qty 1

## 2016-12-28 MED ORDER — IPRATROPIUM-ALBUTEROL 0.5-2.5 (3) MG/3ML IN SOLN
3.0000 mL | Freq: Four times a day (QID) | RESPIRATORY_TRACT | Status: DC | PRN
  Administered 2016-12-30: 3 mL via RESPIRATORY_TRACT
  Filled 2016-12-28: qty 3

## 2016-12-28 MED ORDER — THIAMINE HCL 100 MG/ML IJ SOLN
100.0000 mg | Freq: Every day | INTRAMUSCULAR | Status: DC
  Administered 2016-12-28: 100 mg via INTRAVENOUS
  Filled 2016-12-28: qty 2
  Filled 2016-12-28: qty 1

## 2016-12-28 MED ORDER — DIPHENHYDRAMINE HCL 25 MG PO CAPS
25.0000 mg | ORAL_CAPSULE | Freq: Three times a day (TID) | ORAL | Status: DC | PRN
Start: 2016-12-28 — End: 2016-12-31
  Administered 2016-12-28 – 2016-12-31 (×5): 25 mg via ORAL
  Filled 2016-12-28 (×5): qty 1

## 2016-12-28 MED ORDER — FOLIC ACID 1 MG PO TABS
1.0000 mg | ORAL_TABLET | Freq: Every day | ORAL | Status: DC
  Administered 2016-12-29: 1 mg via ORAL
  Filled 2016-12-28: qty 1

## 2016-12-28 MED ORDER — SODIUM CHLORIDE 0.9 % IV SOLN
1.0000 mg | Freq: Every day | INTRAVENOUS | Status: DC
  Filled 2016-12-28: qty 0.2

## 2016-12-28 MED ORDER — LORAZEPAM 1 MG PO TABS: 1.0000 mg | ORAL_TABLET | Freq: Four times a day (QID) | ORAL | Status: DC | PRN

## 2016-12-28 MED ORDER — FLUTICASONE PROPIONATE 50 MCG/ACT NA SUSP
2.0000 | Freq: Every day | NASAL | Status: DC
Start: 2016-12-28 — End: 2017-01-05
  Administered 2016-12-28 – 2017-01-04 (×9): 2 via NASAL
  Filled 2016-12-28 (×2): qty 16

## 2016-12-28 MED ORDER — LOSARTAN POTASSIUM 50 MG PO TABS
100.0000 mg | ORAL_TABLET | Freq: Every day | ORAL | Status: DC
  Administered 2016-12-28 – 2016-12-30 (×3): 100 mg via ORAL
  Filled 2016-12-28 (×4): qty 2

## 2016-12-28 MED ORDER — PANTOPRAZOLE SODIUM 40 MG PO TBEC
40.0000 mg | DELAYED_RELEASE_TABLET | Freq: Two times a day (BID) | ORAL | Status: DC
  Administered 2016-12-28 – 2016-12-29 (×4): 40 mg via ORAL
  Filled 2016-12-28 (×4): qty 1

## 2016-12-28 MED ORDER — MIDAZOLAM HCL 2 MG/2ML IJ SOLN
1.0000 mg | INTRAMUSCULAR | Status: DC | PRN
  Administered 2016-12-28 (×2): 2 mg via INTRAVENOUS
  Filled 2016-12-28 (×2): qty 2

## 2016-12-28 MED ORDER — LORAZEPAM 2 MG/ML IJ SOLN
1.0000 mg | Freq: Four times a day (QID) | INTRAMUSCULAR | Status: DC | PRN
  Administered 2016-12-28: 1 mg via INTRAVENOUS
  Filled 2016-12-28: qty 1

## 2016-12-28 MED ORDER — FOLIC ACID 1 MG PO TABS
1.0000 mg | ORAL_TABLET | Freq: Every day | ORAL | Status: DC
  Filled 2016-12-28: qty 1

## 2016-12-28 MED ORDER — VITAMIN B-1 100 MG PO TABS
100.0000 mg | ORAL_TABLET | Freq: Every day | ORAL | Status: DC
  Administered 2016-12-28 – 2017-01-17 (×16): 100 mg via ORAL
  Filled 2016-12-28 (×20): qty 1

## 2016-12-28 MED ORDER — TAMSULOSIN HCL 0.4 MG PO CAPS
0.4000 mg | ORAL_CAPSULE | Freq: Every day | ORAL | Status: DC
  Administered 2016-12-28 – 2017-01-17 (×15): 0.4 mg via ORAL
  Filled 2016-12-28 (×19): qty 1

## 2016-12-28 MED ORDER — LORAZEPAM 2 MG/ML IJ SOLN: 2.0000 mg | INTRAMUSCULAR | Status: DC | PRN

## 2016-12-28 MED ORDER — ADULT MULTIVITAMIN W/MINERALS CH
1.0000 | ORAL_TABLET | Freq: Every day | ORAL | Status: DC
Start: 2016-12-28 — End: 2017-01-17
  Administered 2016-12-28 – 2017-01-17 (×18): 1 via ORAL
  Filled 2016-12-28 (×21): qty 1

## 2016-12-28 MED ORDER — LORAZEPAM 2 MG/ML IJ SOLN
1.0000 mg | INTRAMUSCULAR | Status: DC | PRN
  Administered 2016-12-28 – 2017-01-02 (×11): 1 mg via INTRAVENOUS
  Filled 2016-12-28 (×14): qty 1

## 2016-12-28 MED ORDER — THIAMINE HCL 100 MG/ML IJ SOLN
100.0000 mg | Freq: Every day | INTRAMUSCULAR | Status: DC
  Administered 2016-12-30 – 2017-01-02 (×4): 100 mg via INTRAVENOUS
  Filled 2016-12-28: qty 2
  Filled 2016-12-28 (×4): qty 1
  Filled 2016-12-28: qty 2
  Filled 2016-12-28: qty 1

## 2016-12-28 NOTE — Progress Notes (Addendum)
PULMONARY / CRITICAL CARE MEDICINE   Name: [name] [name] MRN: [id number] DOB: 1940/05/21    ADMISSION DATE:  12/26/2016 CONSULTATION DATE:  12/26/2016  REFERRING MD:  Miles Costain  CHIEF COMPLAINT:  Melanotic stools in ETOH abuser  HISTORY OF PRESENT ILLNESS:   The patient is currently confused and the history is obtained from the family and records on transfer. Mr. Guay is a 76 y.o. W M with a hx of significant ETOH abuse; per the family ~ 4 drinks of [name] per day and occasionally several bottles a day, who presented to [facility] with weakness, exertional dyspnea  And melanotic stools for several days. Per the family, he was seen at [facility] 2 days ago; was told his Hb was low but left AMA. He was also at [facility] 2-3 weeks ago and was intubated for ETOH withdrawal.  At [facility], his H&H was 6.5/19.7, for which he was transfused prior to transfer. Other pertinent labs were: blood alcohol <10, Ammonia-19.0, ALT/AST-12/14, bili/alk phos-0.5/70. Received MVI and thiamine prior to transfer. [facility] aware of patient.  Other pertinent medical hx:  COPD: Meds-Bevespi, Spiriva, Symbicort  Atrial fib - Xarelto - family believes this was stopped  HTN - Losartan, amlodipine   PAST MEDICAL HISTORY :  He  has a past medical history of Atrial fibrillation ([place]), COPD (chronic obstructive pulmonary disease) ([place]), History of alcohol abuse, and Hypertension.  PAST SURGICAL HISTORY: He  has a past surgical history that includes No past surgeries and Esophagogastroduodenoscopy (N/A, 12/27/2016).  No Known Allergies  No current facility-administered medications on file prior to encounter.    Current Outpatient Medications on File Prior to Encounter  Medication Sig  . albuterol (PROVENTIL HFA;VENTOLIN HFA) 108 (90 Base) MCG/ACT inhaler Inhale 1-2 puffs into the lungs every 4 (four) hours as needed for wheezing or shortness of breath.  Marland Kitchen albuterol (PROVENTIL)  (2.5 MG/3ML) 0.083% nebulizer solution Take 2.5 mg by nebulization every 6 (six) hours as needed for wheezing or shortness of breath.  . furosemide (LASIX) 40 MG tablet Take 40 mg by mouth 2 (two) times daily as needed for edema.   Marland Kitchen losartan (COZAAR) 100 MG tablet Take 1 tablet (100 mg total) daily by mouth.  . oxyCODONE-acetaminophen (PERCOCET/ROXICET) 5-325 MG tablet Take 1 tablet by mouth daily as needed for moderate pain.   . potassium chloride SA (K-DUR,KLOR-CON) 20 MEQ tablet Take 40 mEq by mouth daily as needed (on days taking Furosemide).   . SYMBICORT 160-4.5 MCG/ACT inhaler INHALE 2 PUFFS TWICE DAILY  . CARTIA XT 120 MG 24 hr capsule TAKE 1 CAPSULE BY MOUTH EVERY MORNING  . cetirizine (ZYRTEC) 10 MG tablet Take 10 mg by mouth daily.  . fluticasone (FLONASE) 50 MCG/ACT nasal spray Place 2 sprays into both nostrils daily.  Marland Kitchen ibuprofen (ADVIL,MOTRIN) 800 MG tablet Take 1 tablet daily as needed by mouth.  . SPIRIVA HANDIHALER 18 MCG inhalation capsule INL CONTENTS OF 1 C ONCE DAILY USING HANDIHALER  . tamsulosin (FLOMAX) 0.4 MG CAPS capsule TK ONE C PO ONCE TO  BID PRF URINARY FREQUENCY  . XARELTO 20 MG TABS tablet TK 1 T PO D WITH SUPPER    FAMILY HISTORY:  His indicated that his mother is alive. He indicated that his father is deceased. He indicated that his brother is alive.   SOCIAL HISTORY: He  reports that he has been smoking cigarettes.  He started smoking about 64 years ago. He has been smoking about 2.00  packs per day. he has never used smokeless tobacco. Tobacco: 2ppp x 60 years ETOH: As above in HPI  REVIEW OF SYSTEMS:   Gen: Negative Resp: As above. No hemoptysis, chronic prod cough CV: HTN, a-fib. No hx AMI, angina GI: No hx hemetemesis, occaasional N and abdominal pain GU: No sxs of prostatism M-S: Has been c/o low back pain for which has been treating with oxycodone and ETOH Neuro: No hx seizures/syncope  SUBJECTIVE:  Stable. EGD yesterday. No more episodes of  bleed.  VITAL SIGNS: BP (!) 121/56   Pulse 65   Temp 97.8 F (36.6 C) (Oral)   Resp (!) 24   Wt 202 lb 9.6 oz (91.9 kg)   SpO2 100%   BMI 31.73 kg/m   HEMODYNAMICS: Hemodynamically stable at present   INTAKE / OUTPUT: I/O last 3 completed shifts: In: 5633.4 [I.V.:5233.4; IV Piggyback:400] Out: 2015 [Urine:2015]  PHYSICAL EXAMINATION: Gen:      No acute distress HEENT:  EOMI, sclera anicteric Neck:     No masses; no thyromegaly Lungs:    Clear to auscultation bilaterally; normal respiratory effort CV:         Regular rate and rhythm; no murmurs Abd:      + bowel sounds; soft, non-tender; no palpable masses, no distension Ext:    No edema; adequate peripheral perfusion Skin:      Warm and dry; no rash Neuro: Awake, mild confusion  BMET Recent Labs  Lab 12/27/16 0154 12/28/16 0306  NA 132* 136  K 4.1 5.0  CL 99* 106  CO2 26 23  BUN 10 13  CREATININE 0.95 1.02  GLUCOSE 147* 92    NA 122, K 4.1, Cl 83, CO2 26.8, BUN 18, Cr 0.08, glu 104   Electrolytes Recent Labs  Lab 12/27/16 0154 12/28/16 0306  CALCIUM 7.6* 7.8*  MG 2.2 2.1  PHOS 4.1 3.4   Ca 8.3, Mg 2.1,  CBC Recent Labs  Lab 12/27/16 1436 12/27/16 1931 12/28/16 0756  WBC 11.4* 13.1* 11.1*  HGB 7.7* 8.0* 7.8*  HCT 24.5* 25.3* 25.0*  PLT 241 240 221   Pre tx: Hb 6.5, Hct 19.7, plt ct 307, WBC11.5 Coag's Recent Labs  Lab 12/26/16 2005  APTT 23*  INR 1.12    Sepsis Markers Recent Labs  Lab 12/27/16 0929 12/28/16 0306  PROCALCITON <0.10 <0.10    ABG No results for input(s): PHART, PCO2ART, PO2ART in the last 168 hours. RA ABGs % sat 95.7, PO2 64, PCO2 43, pH 7.40  Liver Enzymes No results for input(s): AST, ALT, ALKPHOS, BILITOT, ALBUMIN in the last 168 hours. ALT/AST-12/14, bili/alk phos-0.5/70 Cardiac Enzymes No results for input(s): TROPONINI, PROBNP in the last 168 hours. Troponin <0.01, NT-Pro-BNP 2592 Glucose Recent Labs  Lab 12/26/16 2335 12/27/16 0429 12/27/16 0724  12/27/16 1229 12/27/16 1605 12/27/16 2038  GLUCAP 147* 142* 125* 98 87 86    Imaging Dg Chest Port 1 View  Result Date: 12/28/2016 CLINICAL DATA:  Acute respiratory failure. EXAM: PORTABLE CHEST 1 VIEW COMPARISON:  Radiograph of December 27, 2016. FINDINGS: Stable cardiomegaly. No pneumothorax is noted. Elevated left hemidiaphragm is noted. Stable central pulmonary vascular congestion is noted. Mildly increased interstitial densities are noted throughout the right lung concerning for pulmonary edema. Bony thorax is unremarkable. No significant pleural effusion is noted. IMPRESSION: Stable cardiomegaly and central pulmonary vascular congestion. Mildly increased interstitial densities are noted throughout the right lung concerning for pulmonary edema. Electronically Signed   By: Marijo Conception, M.D.  On: 12/28/2016 07:45   PCXR - NAD ECG A-fib (rate 80), rare PVC  STUDIES:  None  CULTURES: None  ANTIBIOTICS: None  SIGNIFICANT EVENTS: Melanotic stools  LINES/TUBES: Periph IV  DISCUSSION: 76 yo M with heavy ETOH abuse,  UGIB and significant anemia.  ASSESSMENT / PLAN:  PULMONARY A: COPD with mild hypoxemia P:   Wean down o2 as tolerated.  Nebs PRN  CARDIOVASCULAR A:  A-fib with rate controlled. Hx HTN P:  Restart cozzar, flomax Holding xarelto due to GIB.  RENAL A:   No active problem P:   Monitor urine output and cr.   GASTROINTESTINAL A:   UGIB, gastric ulcer No varices on EGD P:   Off octreotide PPI bid Swallow eval Follow Hb. Transfuse for Hb < 7   HEMATOLOGIC A:   Fe deficiency anemia (N.B. Serum Fe 26.86) secondary to UGIB P:  Follow CBC SCDs for DVT prophylaxis  INFECTIOUS A:   Mild leukocytosis.  P:   Stop antibiotics as suspicion for infection is low Pct and cultures are negative Appreciate wound care reccs for sacral wound.   ENDOCRINE A:   No hx DM or thyroid disorders   P:   Follow blood sugars  NEUROLOGIC A:   Altered  MS - ETOH related P:   Precedex for alcohol withdrawal. Wean off as tolerated CIWA protocol  The patient is critically ill with multiple organ system failure and requires high complexity decision making for assessment and support, frequent evaluation and titration of therapies, advanced monitoring, review of radiographic studies and interpretation of complex data.   Critical Care Time devoted to patient care services, exclusive of separately billable procedures, described in this note is 35 minutes.   Marshell Garfinkel MD [facility] Pager [phone] If no answer or after 3pm call: [phone] 12/28/2016, 9:22 AM

## 2016-12-28 NOTE — Progress Notes (Signed)
          Daily Rounding Note  12/28/2016, 1:42 PM  LOS: 2 days   SUBJECTIVE:   Chief complaint: wondering why he is not getting his breathing and nasal meds (ventolin, symbicort, flonase)   No BMs today.     Diet advanced to regular today.  No problems with this.   Pt has little insight into his liver disease, says MD told him it was "mild"  Pt returned to drinking [employer] [facility].  Marland Kitchen.     OBJECTIVE:         Vital signs in last 24 hours:    Temp:  [97.8 F (36.6 C)-98.7 F (37.1 C)] 97.8 F (36.6 C) (12/18 1201) Pulse Rate:  [63-80] 73 (12/18 1300) Resp:  [16-31] 20 (12/18 1300) BP: (97-165)/(36-114) 125/62 (12/18 1300) SpO2:  [96 %-100 %] 100 % (12/18 1300) Last BM Date: 12/27/16 Filed Weights   12/26/16 2000 12/27/16 0500  Weight: 93.6 kg (206 lb 5.6 oz) 91.9 kg (202 lb 9.6 oz)   General: looks unwell   Heart: irreg, irreg, rate in 90s on monitor Chest: clear bil.   Abdomen: obese, soft, NT, active BS  Extremities:  Right  >> left lE edema.  Redness in both lower legs Neuro/Psych:  Oriented x 3.  Speaking a lot.  Follows commands.    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  Daily NSAIDs PTA.   12/17 EGD: clean-based antral ulcer.  Biopsied, path: ulcerative gastritis with no H Pylori.  No signs of portal gastropathy, no varices.  Serum H Pylori Ab negative on 12/02/16. On BID IV Protonix.    *  Hyponatremia.    *  Normocytic anemia.  B12 deficient and began oral supplement 3 weeks ago.  Hgb stable.  2 [facility]BC[facility], none  since [facility].  *  COPD.   PLAN   *  CBC in am.   *  Switch to oral BID Protonix.    *  Advised pt he needs to abstain from ETOH.  This seemed to surprise him.     Jennye MoccasinSarah Gribbin  12/28/2016, 1:42 PM Pager: [phone]    [facility] Attending   I have taken an interval history, reviewed the chart and examined the patient. I agree with the Advanced Practitioner's note, impression and recommendations.   Ulcer path benign and no H pylori  Cirrhosis - compensated but not abstinent from alcohol "I like bourbon"  Acute blood loss anemia - stable, ferritin 171 in late Nov  Out to stepdown/floor soon.  Daily PPI indefinitely  Will offer him f/u w/ me if desired   Iva Booparl E. Gessner, MD, [facility] [phone] (pager) 12/28/2016 7:26 PM

## 2016-12-28 NOTE — Progress Notes (Signed)
Reviewed at hospital benign gastric ulcer no H pylori  No recall/letter needed

## 2016-12-28 NOTE — Evaluation (Signed)
Clinical/Bedside Swallow Evaluation Patient Details  Name: [name] [name] MRN: [id number] Date of Birth: 1940/02/06  Today's Date: 12/28/2016 Time: SLP Start Time (ACUTE ONLY): 0912 SLP Stop Time (ACUTE ONLY): 0925 SLP Time Calculation (min) (ACUTE ONLY): 13 min  Past Medical History:  Past Medical History:  Diagnosis Date  . Atrial fibrillation (HCC)   . COPD (chronic obstructive pulmonary disease) (HCC)   . History of alcohol abuse   . Hypertension    Past Surgical History:  Past Surgical History:  Procedure Laterality Date  . ESOPHAGOGASTRODUODENOSCOPY N/A 12/27/2016   Procedure: ESOPHAGOGASTRODUODENOSCOPY (EGD);  Surgeon: Iva BoopGessner, Carl E, MD;  [facility];  Service: Endoscopy;  Laterality: N/A;  . NO PAST SURGERIES     HPI:  Pt is a 76 yo M with heavy ETOH abuse with likely UGIB and significant anemia. Lung exam suggestive of RLL consolidation. Per the family, [facility] days ago; was told his Hb was low but left AMA. [facility] weeks ago and was intubated for ETOH withdrawal. UGI 12/17 showed non-bleeding gastric ulcer. PMH also includes COPD, a fib, and HTN.   Assessment / Plan / Recommendation Clinical Impression  Pt has no overt signs of aspiraiton or dysphagia at bedside. He does have mildly prolonged mastication, which he reports as his baseline and is likely functional given his lack of dentition. Recommend regular textures and thin liquids. SLP will f/u briefly for tolerance given mentation and concern for difficulty during RN-administered trials earlier this morning. SLP Visit Diagnosis: Dysphagia, unspecified (R13.10)    Aspiration Risk  Mild aspiration risk    Diet Recommendation Regular;Thin liquid   Liquid Administration via: Cup;Straw Medication Administration: Whole meds with liquid Supervision: Patient able to self feed;Intermittent supervision to cue for compensatory strategies Compensations: Slow  rate;Small sips/bites;Minimize environmental distractions Postural Changes: Seated upright at 90 degrees    Other  Recommendations Oral Care Recommendations: Oral care BID   Follow up Recommendations None      Frequency and Duration min 1 x/week  1 week       Prognosis        Swallow Study   General HPI: Pt is a 76 yo M with heavy ETOH abuse with likely UGIB and significant anemia. Lung exam suggestive of RLL consolidation. Per the family, [facility] ago; was told his Hb was low but left AMA. [facility] and was intubated for ETOH withdrawal. UGI 12/17 showed non-bleeding gastric ulcer. PMH also includes COPD, a fib, and HTN. Type of Study: Bedside Swallow Evaluation Previous Swallow Assessment: none in chart Diet Prior to this Study: NPO Temperature Spikes Noted: No Respiratory Status: Nasal cannula History of Recent Intubation: No(few wks ago at [facility] - unsure duration) Behavior/Cognition: Alert;Cooperative;Requires cueing Oral Cavity Assessment: Dry Oral Care Completed by SLP: No Oral Cavity - Dentition: Edentulous Vision: Functional for self-feeding Self-Feeding Abilities: Able to feed self;Needs assist Patient Positioning: Upright in bed Baseline Vocal Quality: Normal Volitional Cough: Strong Volitional Swallow: Unable to elicit("too dry")    Oral/Motor/Sensory Function Overall Oral Motor/Sensory Function: Within functional limits   Ice Chips Ice chips: Within functional limits Presentation: Spoon   Thin Liquid Thin Liquid: Within functional limits Presentation: Cup;Self Fed;Straw    Nectar Thick Nectar Thick Liquid: Not tested   Honey Thick Honey Thick Liquid: Not tested   Puree Puree: Within functional limits Presentation: Self Fed;Spoon   Solid  GO   Solid: Within functional limits(given lack of dentition) Presentation: Self Georjean ModeFed        Paiewonsky, Dea Bitting 12/28/2016,9:36 AM  Maxcine HamLaura Paiewonsky, M.A.  CCC-SLP [phone]

## 2016-12-28 NOTE — Care Management Note (Signed)
Case Management Note  Patient Details  Name: Cristal GenerousJerry F Delapena MRN: [id number] Date of Birth: 1940/04/08  Subjective/Objective:    Pt admitted with melanotic stools - pt is an excessive alcohol abuser                Action/Plan:  PTA from home.  Pt is on Precedex drip.  CSW consulted for substance abuse.  CM will continue to follow      Expected Discharge Date:                  Expected Discharge Plan:     In-House Referral:  Clinical Social Work  Discharge planning Services  CM Consult  Post Acute Care Choice:    Choice offered to:     DME Arranged:    DME Agency:     HH Arranged:    HH Agency:     Status of Service:     If discussed at [employer] Meetings, dates discussed:    Additional Comments:  Cherylann ParrClaxton, Rifka Ramey S, RN 12/28/2016, 2:43 PM

## 2016-12-28 NOTE — Progress Notes (Signed)
eLink Physician-Brief Progress Note Patient Name: [name] GenerousJerry F Scruton DOB: [birth year] MRN: [id number]   Date of Service  12/28/2016  HPI/Events of Note  ETOH abuse - Now with agitated delirium. Likely d/t ETOH withdrawal. Already on Precedex IV infusion.   eICU Interventions  Will order: 1. ICU CIWA protocol.      Intervention Category Major Interventions: Delirium, psychosis, severe agitation - evaluation and management  Sommer,Steven Eugene 12/28/2016, 3:05 AM

## 2016-12-28 NOTE — Progress Notes (Signed)
Pt. Continues to yelling scream at staff for water and to go home.  Pt uncooperative throughout shift and very agitated and restless.

## 2016-12-28 NOTE — Plan of Care (Signed)
  Progressing Clinical Measurements: Will remain free from infection 12/28/2016 2042 - Progressing by Theodoro ParmaFlynt, Spirit Wernli F, RN Diagnostic test results will improve 12/28/2016 2042 - Progressing by Theodoro ParmaFlynt, Leilyn Frayre F, RN Respiratory complications will improve 12/28/2016 2042 - Progressing by Theodoro ParmaFlynt, Lasharn Bufkin F, RN Cardiovascular complication will be avoided 12/28/2016 2042 - Progressing by Theodoro ParmaFlynt, Saagar Tortorella F, RN Nutrition: Adequate nutrition will be maintained 12/28/2016 2042 - Progressing by Theodoro ParmaFlynt, Rusty Villella F, RN Skin Integrity: Risk for impaired skin integrity will decrease 12/28/2016 2042 - Progressing by Theodoro ParmaFlynt, Bexleigh Theriault F, RN

## 2016-12-28 NOTE — Progress Notes (Signed)
Pt.  failed bedside swallow evaluation, speech consulted.

## 2016-12-29 DIAGNOSIS — J9600 Acute respiratory failure, unspecified whether with hypoxia or hypercapnia: Secondary | ICD-10-CM

## 2016-12-29 DIAGNOSIS — D62 Acute posthemorrhagic anemia: Secondary | ICD-10-CM

## 2016-12-29 DIAGNOSIS — K7030 Alcoholic cirrhosis of liver without ascites: Secondary | ICD-10-CM

## 2016-12-29 LAB — BASIC METABOLIC PANEL
ANION GAP: 7 (ref 5–15)
BUN: 11 mg/dL (ref 6–20)
CALCIUM: 8.2 mg/dL — AB (ref 8.9–10.3)
CO2: 25 mmol/L (ref 22–32)
Chloride: 103 mmol/L (ref 101–111)
Creatinine, Ser: 0.92 mg/dL (ref 0.61–1.24)
GFR calc Af Amer: >60 mL/min (ref >60)
GLUCOSE: 103 mg/dL — AB (ref 65–99)
Potassium: 3.8 mmol/L (ref 3.5–5.1)
Sodium: 135 mmol/L (ref 135–145)

## 2016-12-29 LAB — CBC
HCT: 24.1 % — ABNORMAL LOW (ref 39.0–52.0)
HCT: 25 % — ABNORMAL LOW (ref 39.0–52.0)
HEMATOCRIT: 26.8 % — AB (ref 39.0–52.0)
HEMOGLOBIN: 8.5 g/dL — AB (ref 13.0–17.0)
Hemoglobin: 7.5 g/dL — ABNORMAL LOW (ref 13.0–17.0)
Hemoglobin: 7.8 g/dL — ABNORMAL LOW (ref 13.0–17.0)
MCH: 30.4 pg (ref 26.0–34.0)
MCH: 30.4 pg (ref 26.0–34.0)
MCH: 29.9 pg (ref 26.0–34.0)
MCHC: 31.1 g/dL (ref 30.0–36.0)
MCHC: 31.2 g/dL (ref 30.0–36.0)
MCHC: 31.7 g/dL (ref 30.0–36.0)
MCV: 97.6 fL (ref 78.0–100.0)
MCV: 97.3 fL (ref 78.0–100.0)
MCV: 94.4 fL (ref 78.0–100.0)
PLATELETS: 209 10*3/uL (ref 150–400)
PLATELETS: 223 10*3/uL (ref 150–400)
Platelets: 195 10*3/uL (ref 150–400)
RBC: 2.47 MIL/uL — ABNORMAL LOW (ref 4.22–5.81)
RBC: 2.57 MIL/uL — ABNORMAL LOW (ref 4.22–5.81)
RBC: 2.84 MIL/uL — ABNORMAL LOW (ref 4.22–5.81)
RDW: 17.8 % — AB (ref 11.5–15.5)
RDW: 17.8 % — AB (ref 11.5–15.5)
RDW: 19.2 % — AB (ref 11.5–15.5)
WBC: 11.3 10*3/uL — AB (ref 4.0–10.5)
WBC: 11.5 10*3/uL — ABNORMAL HIGH (ref 4.0–10.5)
WBC: 12.6 10*3/uL — ABNORMAL HIGH (ref 4.0–10.5)

## 2016-12-29 LAB — AEROBIC CULTURE W GRAM STAIN (SUPERFICIAL SPECIMEN): Culture: NORMAL

## 2016-12-29 LAB — GLUCOSE, CAPILLARY: Glucose-Capillary: 95 mg/dL (ref 65–99)

## 2016-12-29 LAB — TYPE AND SCREEN
ABO/RH(D): O POS
Antibody Screen: NEGATIVE

## 2016-12-29 LAB — PHOSPHORUS: Phosphorus: 2.6 mg/dL (ref 2.5–4.6)

## 2016-12-29 LAB — PREPARE RBC (CROSSMATCH)

## 2016-12-29 LAB — PROCALCITONIN: Procalcitonin: <0.1 ng/mL

## 2016-12-29 LAB — AEROBIC CULTURE  (SUPERFICIAL SPECIMEN): GRAM STAIN: NONE SEEN

## 2016-12-29 LAB — MAGNESIUM: Magnesium: 2 mg/dL (ref 1.7–2.4)

## 2016-12-29 MED ORDER — SODIUM CHLORIDE 0.9 % IV SOLN
Freq: Once | INTRAVENOUS | Status: AC
  Administered 2016-12-29: 11:00:00 via INTRAVENOUS

## 2016-12-29 NOTE — Progress Notes (Signed)
  Speech Language Pathology Treatment: Dysphagia  Patient Details Name: [name] [name] MRN: [id number] DOB: [birth year] Today's Date: 12/29/2016 Time: 0913-0930 SLP Time Calculation (min) (ACUTE ONLY): 17 min  Assessment / Plan / Recommendation Clinical Impression  Pt needs set-up assist and feeding assistance when using utensils, cups. He has immediate coughing following cup sips of thin liquids, likely due to difficulty coordinating when someone else is feeding. SLP used a straw to allow pt to initiate intake, control rate. He had no further signs of aspiration observed. He does much better self-feeding with foods when he can use his hands - SLP provided assistance for cutting and tray set-up so that he could feed himself and increase his safety during intake. Recommend continuing current diet so that he can have a full range of food choices so that he can select more finger foods. Will continue to follow briefly for tolerance.   HPI HPI: Pt is a 76 yo M with heavy ETOH abuse with likely UGIB and significant anemia. Lung exam suggestive of RLL consolidation. Per the family, he was seen at [facility] ago; was told his Hb was low but left AMA. [facility] weeks ago and was intubated for ETOH withdrawal. UGI 12/17 showed non-bleeding gastric ulcer. PMH also includes COPD, a fib, and HTN.      SLP Plan  Continue with current plan of care       Recommendations  Diet recommendations: Regular;Thin liquid Liquids provided via: Straw Medication Administration: Whole meds with liquid Supervision: Staff to assist with self feeding;Intermittent supervision to cue for compensatory strategies Compensations: Slow rate;Small sips/bites;Minimize environmental distractions Postural Changes and/or Swallow Maneuvers: Seated upright 90 degrees                Oral Care Recommendations: Oral care BID Follow up Recommendations: None SLP Visit Diagnosis: Dysphagia, unspecified  (R13.10) Plan: Continue with current plan of care       GO                Maxcine Hamaiewonsky, Genova Kiner 12/29/2016, 9:52 AM   Maxcine HamLaura Paiewonsky, M.A. CCC-SLP [phone]

## 2016-12-29 NOTE — Progress Notes (Addendum)
PROGRESS NOTE    [name] [name]  ZOX:[id number] DOB: [birth year] DOA: 12/26/2016 PCP: Ardyth Man, MD    Brief Narrative: 76 y.o. W M with a hx of significant ETOH abuse; per the family ~ 4 drinks of [name] per day and occasionally several bottles a day, who presented to [facility] with weakness, exertional dyspnea  And melanotic stools for several days     Assessment & Plan:   Active Problems:   GI bleed requiring more than 4 units of blood in 24 hours, ICU, or surgery   Melena - Will monitor cbc's serially. Transfuse if hgb less than 8 with active bleed or less than 7 with no active bleeding. - secondary to ulcer.   Addendum: I suspect patient will require another unit of blood given that his last was lower than prior and is near 7. As such will transfuse 1 unit    Antral ulcer, chronic - GI recommending alcohol cessation and PPI indefinitely - H pylori negative  Alcohol Dependence - continue thiamine, folate, encourage alcohol cessation, and CIWA protocol with ativan.    DVT prophylaxis: SCD's Code Status: Full Family Communication: none at bedside. Disposition Plan: Pending improvement in condition   Consultants:   GI: Dr. Leone Payor   Procedures: s/p endoscopy   Antimicrobials: none   Subjective: Pt has no new complaints currently  Objective: Vitals:   12/29/16 0700 12/29/16 0753 12/29/16 0800 12/29/16 0900  BP: (!) 151/81  (!) 153/71 (!) 145/73  Pulse: (!) 116  (!) 110 (!) 111  Resp: (!) 24  (!) 26 (!) 40  Temp:  98 F (36.7 C)    TempSrc:  Oral    SpO2: 97%  100% 98%  Weight:        Intake/Output Summary (Last 24 hours) at 12/29/2016 1027 Last data filed at 12/29/2016 0800 Gross per 24 hour  Intake 90 ml  Output 1600 ml  Net -1510 ml   Filed Weights   12/26/16 2000 12/27/16 0500 12/29/16 0500  Weight: 93.6 kg (206 lb 5.6 oz) 91.9 kg (202 lb 9.6 oz) 97.2 kg (214 lb 4.6 oz)    Examination:  General exam: Appears calm and  comfortable, in nad.   Respiratory system: Clear to auscultation. Respiratory effort normal. Cardiovascular system: S1 & S2 heard, RRR. No JVD, murmurs, rubs, gallops  Gastrointestinal system: Abdomen is obese, soft and nontender.  Central nervous system: No facial asymmetry, answers questions appropriately, moves extremities equally Extremities: warm, no deformities Skin: No rashes, lesions or ulcers, on limited exam. Psychiatry: Mood & affect appropriate.     Data Reviewed: I have personally reviewed following labs and imaging studies  CBC: Recent Labs  Lab 12/27/16 0742 12/27/16 1436 12/27/16 1931 12/28/16 0756 12/29/16 0408  WBC 7.5 11.4* 13.1* 11.1* 11.3*  HGB 7.2* 7.7* 8.0* 7.8* 7.5*  HCT 22.4* 24.5* 25.3* 25.0* 24.1*  MCV 97.0 96.5 97.7 98.0 97.6  PLT 241 241 240 221 209   Basic Metabolic Panel: Recent Labs  Lab 12/27/16 0154 12/28/16 0306 12/29/16 0408  NA 132* 136 135  K 4.1 5.0 3.8  CL 99* 106 103  CO2 26 23 25   GLUCOSE 147* 92 103*  BUN 10 13 11   CREATININE 0.95 1.02 0.92  CALCIUM 7.6* 7.8* 8.2*  MG 2.2 2.1 2.0  PHOS 4.1 3.4 2.6   GFR: Estimated Creatinine Clearance: 75.8 mL/min (by C-G formula based on SCr of 0.92 mg/dL). Liver Function Tests: No results for input(s): AST, ALT, ALKPHOS,  BILITOT, PROT, ALBUMIN in the last 168 hours. No results for input(s): LIPASE, AMYLASE in the last 168 hours. No results for input(s): AMMONIA in the last 168 hours. Coagulation Profile: Recent Labs  Lab 12/26/16 2005  INR 1.12   Cardiac Enzymes: No results for input(s): CKTOTAL, CKMB, CKMBINDEX, TROPONINI in the last 168 hours. BNP (last 3 results) No results for input(s): PROBNP in the last 8760 hours. HbA1C: No results for input(s): HGBA1C in the last 72 hours. CBG: Recent Labs  Lab 12/27/16 0429 12/27/16 0724 12/27/16 1229 12/27/16 1605 12/27/16 2038  GLUCAP 142* 125* 98 87 86   Lipid Profile: No results for input(s): CHOL, HDL, LDLCALC, TRIG,  CHOLHDL, LDLDIRECT in the last 72 hours. Thyroid Function Tests: No results for input(s): TSH, T4TOTAL, FREET4, T3FREE, THYROIDAB in the last 72 hours. Anemia Panel: Recent Labs    12/26/16 2142  VITAMINB12 >7,500*   Sepsis Labs: Recent Labs  Lab 12/27/16 0929 12/28/16 0306 12/29/16 0408  PROCALCITON <0.10 <0.10 <0.10    Recent Results (from the past 240 hour(s))  MRSA PCR Screening     Status: None   Collection Time: 12/26/16  5:52 PM  Result Value Ref Range Status   MRSA by PCR NEGATIVE NEGATIVE Final    Comment:        The GeneXpert MRSA Assay (FDA approved for NASAL specimens only), is one component of a comprehensive MRSA colonization surveillance program. It is not intended to diagnose MRSA infection nor to guide or monitor treatment for MRSA infections.   Aerobic Culture (superficial specimen)     Status: None (Preliminary result)   Collection Time: 12/26/16  8:16 PM  Result Value Ref Range Status   Specimen Description WOUND LEFT THIGH  Final   Special Requests NONE  Final   Gram Stain NO WBC SEEN RARE GRAM POSITIVE COCCI IN PAIRS   Final   Culture CULTURE REINCUBATED FOR BETTER GROWTH  Final   Report Status PENDING  Incomplete  Culture, blood (Routine X 2) w Reflex to ID Panel     Status: None (Preliminary result)   Collection Time: 12/27/16  9:40 AM  Result Value Ref Range Status   Specimen Description BLOOD LEFT HAND  Final   Special Requests IN PEDIATRIC BOTTLE Blood Culture adequate volume  Final   Culture NO GROWTH 1 DAY  Final   Report Status PENDING  Incomplete  Culture, blood (Routine X 2) w Reflex to ID Panel     Status: None (Preliminary result)   Collection Time: 12/27/16  9:45 AM  Result Value Ref Range Status   Specimen Description BLOOD RIGHT ANTECUBITAL  Final   Special Requests IN PEDIATRIC BOTTLE Blood Culture adequate volume  Final   Culture NO GROWTH 1 DAY  Final   Report Status PENDING  Incomplete  Urine Culture     Status: None     Collection Time: 12/27/16 12:43 PM  Result Value Ref Range Status   Specimen Description URINE, CATHETERIZED  Final   Special Requests NONE  Final   Culture NO GROWTH  Final   Report Status 12/28/2016 FINAL  Final         Radiology Studies: Dg Chest Port 1 View  Result Date: 12/28/2016 CLINICAL DATA:  Acute respiratory failure. EXAM: PORTABLE CHEST 1 VIEW COMPARISON:  Radiograph of December 27, 2016. FINDINGS: Stable cardiomegaly. No pneumothorax is noted. Elevated left hemidiaphragm is noted. Stable central pulmonary vascular congestion is noted. Mildly increased interstitial densities are noted throughout the right  lung concerning for pulmonary edema. Bony thorax is unremarkable. No significant pleural effusion is noted. IMPRESSION: Stable cardiomegaly and central pulmonary vascular congestion. Mildly increased interstitial densities are noted throughout the right lung concerning for pulmonary edema. Electronically Signed   By: Lupita RaiderJames  Green Jr, M.D.   On: 12/28/2016 07:45        Scheduled Meds: . fluticasone  2 spray Each Nare Daily  . folic acid  1 mg Oral Daily  . losartan  100 mg Oral Daily  . mouth rinse  15 mL Mouth Rinse BID  . multivitamin with minerals  1 tablet Oral Daily  . nicotine  21 mg Transdermal Daily  . pantoprazole  40 mg Oral BID  . tamsulosin  0.4 mg Oral QPC supper  . thiamine  100 mg Oral Daily   Or  . thiamine  100 mg Intravenous Daily   Continuous Infusions: . sodium chloride 250 mL (12/28/16 1000)     LOS: 3 days    Time spent: > 35 minutes  Penny PiaVEGA, Lisandra Mathisen, MD [facility] Pager [phone]  If 7PM-7AM, please contact night-coverage [email or web address] Password [email or web address] 12/29/2016, 10:27 AM

## 2016-12-29 NOTE — Progress Notes (Addendum)
          Daily Rounding Note  12/29/2016, 11:56 AM  LOS: 3 days   SUBJECTIVE:     Enjoying regular diet.  No BMs or passing blood.    OBJECTIVE:         Vital signs in last 24 hours:    Temp:  [97.6 F (36.4 C)-98.7 F (37.1 C)] 98.5 F (36.9 C) (12/19 1128) Pulse Rate:  [69-126] 117 (12/19 1128) Resp:  [20-48] 23 (12/19 1128) BP: (110-154)/(51-93) 149/83 (12/19 1128) SpO2:  [89 %-100 %] 100 % (12/19 1128) Weight:  [97.2 kg (214 lb 4.6 oz)] 97.2 kg (214 lb 4.6 oz) (12/19 0500) Last BM Date: 12/29/16 Filed Weights   12/26/16 2000 12/27/16 0500 12/29/16 0500  Weight: 93.6 kg (206 lb 5.6 oz) 91.9 kg (202 lb 9.6 oz) 97.2 kg (214 lb 4.6 oz)   General: looks chronically ill.  Sleeping, did not awaken pt to examine him   Heart: irreg, irreg, rate ~ 111 Chest: effortful breathing, thrusts thorax forceably with respirations, especially at exhale Abdomen: obese, NT, active BS  Extremities: bil LE edema, left > right.   Neuro/Psych:  sleeping   Lab Results: Recent Labs    12/28/16 0756 12/29/16 0408 12/29/16 1057  WBC 11.1* 11.3* 11.5*  HGB 7.8* 7.5* 7.8*  HCT 25.0* 24.1* 25.0*  PLT 221 209 223     ASSESMENT:   *  Upper GIB.   12/17 EGD: clean-based antral ulcer.  Biopsied, path: ulcerative gastritis with no H Pylori.  No signs of portal gastropathy, no varices.  Serum H Pylori Ab negative 12/02/16.  Path: ulcerative gastritis, no H Pylori/dysplasia/malignancy. Protonix BID PO in place.    *  ETOH cirrhosis.  No portal gastropathy or varices on EGD.    *  Blood loss anemia.  Getting 1 U PRBC now.    *  Chronic ETOH abuse, little insight into disease and it's impacts.    PLAN   *  Continue current mgt, awaiting transfer to tele unit when bed available.    *  CBC in AM.      Jennye MoccasinSarah Gribbin  12/29/2016, 11:56 AM Pager: [phone]    [facility] Attending   I have taken an interval history, reviewed the chart and  examined the patient. I agree with the Advanced Practitioner's note, impression and recommendations.   Can set up f/u visit in our office for 2 weeks (APP ok) and sign off if stable tomorrow   Iva Booparl E. Mekesha Solomon, MD, Antionette FairyFACG  Gastroenterology [phone] (pager) 12/29/2016 6:40 PM

## 2016-12-29 NOTE — Clinical Social Work Note (Signed)
Clinical Social Work Assessment  Patient Details  Name: [name] GenerousJerry F Koo MRN: [id number] Date of Birth: [birth year]  Date of referral:  12/29/16               Reason for consult:  Abuse/Neglect(Alcohol Abuse. )                Permission sought to share information with:    Permission granted to share information::     Name::        Agency::     Relationship::     Contact Information:     Housing/Transportation Living arrangements for the past 2 months:  Single Family Home(with wife. ) Source of Information:  Patient Patient Interpreter Needed:  None Criminal Activity/Legal Involvement Pertinent to Current Situation/Hospitalization:  No - Comment as needed(not that I am aware of. ) Significant Relationships:  Adult Children, Spouse, Other Family Members Lives with:  Spouse Do you feel safe going back to the place where you live?  Yes Need for family participation in patient care:  Yes (Comment)  Care giving concerns:  CSW spoke with pt at bedside. Pt expressed no concerns to CSW at this time.    Social Worker assessment / plan:  CSW spoke with pt at bedside. Pt expressed that pt is from home with wife. Pt reports that pt does not drink but would be willing to accept resources just in case pt does.   Employment status:  Retired Health and safety inspectornsurance information:  Medicare PT Recommendations:  Not assessed at this time Information / Referral to community resources:  Outpatient Psychiatric Care (Comment Required), Residential Substance Abuse Treatment Options  Patient/Family's Response to care:  Pt is agreeable to plan of care at this time.   Patient/Family's Understanding of and Emotional Response to Diagnosis, Current Treatment, and Prognosis:  No further questions or concerns have been presented to CSW at this time.   Emotional Assessment Appearance:  Appears younger than stated age Attitude/Demeanor/Rapport:    Affect (typically observed):  Appropriate, Pleasant Orientation:  Oriented to  Self, Oriented to Situation, Oriented to Place, Oriented to  Time Alcohol / Substance use:    Psych involvement (Current and /or in the community):     Discharge Needs  Concerns to be addressed:  Substance Abuse Concerns Readmission within the last 30 days:  No Current discharge risk:  None Barriers to Discharge:  No Barriers Identified   Robb MatarKierra S Malory Spurr, LCSWA 12/29/2016, 9:09 AM

## 2016-12-30 ENCOUNTER — Inpatient Hospital Stay (HOSPITAL_COMMUNITY): Payer: Medicare Other

## 2016-12-30 DIAGNOSIS — R0602 Shortness of breath: Secondary | ICD-10-CM

## 2016-12-30 DIAGNOSIS — J9601 Acute respiratory failure with hypoxia: Secondary | ICD-10-CM

## 2016-12-30 LAB — CBC
HCT: 26.4 % — ABNORMAL LOW (ref 39.0–52.0)
Hemoglobin: 8.3 g/dL — ABNORMAL LOW (ref 13.0–17.0)
MCH: 29.4 pg (ref 26.0–34.0)
MCHC: 31.4 g/dL (ref 30.0–36.0)
MCV: 93.6 fL (ref 78.0–100.0)
PLATELETS: 206 10*3/uL (ref 150–400)
RBC: 2.82 MIL/uL — AB (ref 4.22–5.81)
RDW: 19.1 % — AB (ref 11.5–15.5)
WBC: 11 10*3/uL — AB (ref 4.0–10.5)

## 2016-12-30 LAB — TYPE AND SCREEN
ABO/RH(D): O POS
Antibody Screen: NEGATIVE
Unit division: 0

## 2016-12-30 LAB — BPAM RBC
BLOOD PRODUCT EXPIRATION DATE: 201812222359
ISSUE DATE / TIME: 201812191101
UNIT TYPE AND RH: 9500

## 2016-12-30 LAB — BRAIN NATRIURETIC PEPTIDE: B NATRIURETIC PEPTIDE 5: 262.4 pg/mL — AB (ref 0.0–100.0)

## 2016-12-30 LAB — AMMONIA: AMMONIA: 34 umol/L (ref 9–35)

## 2016-12-30 MED ORDER — FERROUS SULFATE 325 (65 FE) MG PO TABS
325.0000 mg | ORAL_TABLET | Freq: Every day | ORAL | Status: DC
  Administered 2016-12-30 – 2017-01-17 (×17): 325 mg via ORAL
  Filled 2016-12-30 (×20): qty 1

## 2016-12-30 MED ORDER — LORAZEPAM 2 MG/ML IJ SOLN: 1.0000 mg | Freq: Once | INTRAMUSCULAR | Status: DC

## 2016-12-30 MED ORDER — DILTIAZEM HCL 25 MG/5ML IV SOLN
10.0000 mg | Freq: Once | INTRAVENOUS | Status: AC
  Administered 2016-12-30: 10 mg via INTRAVENOUS
  Filled 2016-12-30: qty 5

## 2016-12-30 MED ORDER — PANTOPRAZOLE SODIUM 40 MG PO TBEC: 40.0000 mg | DELAYED_RELEASE_TABLET | Freq: Every day | ORAL | Status: DC

## 2016-12-30 MED ORDER — TRAMADOL HCL 50 MG PO TABS
50.0000 mg | ORAL_TABLET | Freq: Four times a day (QID) | ORAL | Status: DC | PRN
  Administered 2016-12-30 – 2016-12-31 (×2): 50 mg via ORAL
  Filled 2016-12-30 (×3): qty 1

## 2016-12-30 MED ORDER — FERROUS SULFATE 325 (65 FE) MG PO TABS
325.0000 mg | ORAL_TABLET | Freq: Two times a day (BID) | ORAL | Status: DC
  Filled 2016-12-30: qty 1

## 2016-12-30 MED ORDER — FUROSEMIDE 10 MG/ML IJ SOLN
40.0000 mg | Freq: Two times a day (BID) | INTRAMUSCULAR | Status: DC
  Administered 2016-12-30 – 2017-01-05 (×12): 40 mg via INTRAVENOUS
  Filled 2016-12-30 (×14): qty 4

## 2016-12-30 MED ORDER — DILTIAZEM HCL 30 MG PO TABS
30.0000 mg | ORAL_TABLET | Freq: Four times a day (QID) | ORAL | Status: DC
  Administered 2016-12-30 – 2016-12-31 (×3): 30 mg via ORAL
  Filled 2016-12-30 (×4): qty 1

## 2016-12-30 MED ORDER — DILTIAZEM LOAD VIA INFUSION: 10.0000 mg | Freq: Once | INTRAVENOUS | Status: DC

## 2016-12-30 MED ORDER — FUROSEMIDE 10 MG/ML IJ SOLN
60.0000 mg | Freq: Once | INTRAMUSCULAR | Status: AC
  Administered 2016-12-30: 60 mg via INTRAVENOUS
  Filled 2016-12-30: qty 6

## 2016-12-30 NOTE — Progress Notes (Signed)
Pt has not urinated since post foley catheter removal. Bladder scanner shows 397. Will reassess in 6hrs.

## 2016-12-30 NOTE — Progress Notes (Signed)
Paged by bedside RN regarding pts WOB, 02sats and increased HR into the 140's. Pt also refusing to wear the Bipap and requesting to be a DNR. Upon entering the room bedside RN was present and  pt was wearing the Bipap but agitated and restless. He was also tachypneic with accessory muscle use. CXR obtained today showed "cardiomegaly with muld pulmonary congestion" and he received 40mg  of Lasix IV at 1710. His BNP was also elevated. HR in the 120's to 140's appears to be A-fib. Discussed with the pt that if is oxygen does not improve that the next step would be to place him on a ventilator.  Pt is alert and oriented x4 and states that he does not want to be intubated or placed on a ventilator. Pt is now a DNR.  He was agreeable to wear the Bipap for now as long as he needs it. Upon exiting the room pt was resting on Bipap. We will continue to monitor.  Assessment and Plan 1. Acute Respiratory Distress- Pt agreeable to wear Bipap for now. Does not want to be intubated and was made a DNR. Will order 1mg  of Ativan IV and has 1mg  of Ativan Q4H PRN. CXR obtained earlier in the day.   2. A-Fib- will obtain EKG. Given 10mg  of IV Cardizem. If HR maintains will start a Cardizem gtt.   Stevie Kernharles Cassady Stanczak NP-C, AGPCNP-BC [facility] Pager: [phone]  CRITICAL CARE Performed by: Macon Largeharles A Chandria Rookstool   Total critical care time: 30 minutes  Critical care time was exclusive of separately billable procedures and treating other patients.  Critical care was necessary to treat or prevent imminent or life-threatening deterioration.  Critical care was time spent personally by me on the following activities: development of treatment plan with patient and/or surrogate as well as nursing, discussions with consultants, evaluation of patient's response to treatment, examination of patient, obtaining history from patient or surrogate, ordering and performing treatments and interventions, ordering and review  of laboratory studies, ordering and review of radiographic studies, pulse oximetry and re-evaluation of patient's condition.

## 2016-12-30 NOTE — Progress Notes (Signed)
Hgb stable at 8.3  Agitated this AM and got Ativan and is currently confused with slurred speech, slow responses and not consistenly following commands.    Continue daily Protonix 40 mg at discharge.  Note Dr Cena BentonVega added po Iron for anemia.  There are no iron studies, MCV in mid 90s,  do not think he needs this long term and black stools may confuse pt and family as to active GI bleeding.  Dropping this to once a day and would take for at most 1 month.    Made appt with GI APP for 1/7 at 2:30 PM  GI signing off.    Will order ammonia level, if elevated would add lactulose.  Last BM soft,  brown at 8 AM on 12/19.     Jennye MoccasinSarah Jodine Muchmore PA-C

## 2016-12-30 NOTE — Progress Notes (Addendum)
PROGRESS NOTE    [name] [name]  WUJ:[id number] DOB: Feb 17, 1940 DOA: 12/26/2016 PCP: Ardyth ManPark, Chan M, MD    Brief Narrative: 76 y.o. W M with a hx of significant ETOH abuse; per the family ~ 4 drinks of [name] per day and occasionally several bottles a day, who [facility] with weakness, exertional dyspnea  And melanotic stools for several days  Assessment & Plan:   Active Problems:   GI bleed requiring more than 4 units of blood in 24 hours, ICU, or surgery   Melena -  Transfuse if hgb less than 8 with active bleed or less than 7 with no active bleeding. - secondary to ulcer.  - s/p transfusion with last hgb at 8.3. Will add ferrous sulfate to regimen today. - reassess cbc next am.  Increased wob - will obtain portable chest x ray to assess - also obtain BNP    Antral ulcer, chronic - GI recommending alcohol cessation and PPI indefinitely - H pylori negative  Alcohol Dependence - continue thiamine, folate, encourage alcohol cessation, and CIWA protocol with ativan.    DVT prophylaxis: SCD's Code Status: Full Family Communication: none at bedside. Disposition Plan: tranfer to telemetry today   Consultants:   GI: Dr. Leone PayorGessner   Procedures: s/p endoscopy   Antimicrobials: none   Subjective: No complaints reported.  Objective: Vitals:   12/30/16 0500 12/30/16 0600 12/30/16 0620 12/30/16 0831  BP: (!) 150/76 (!) 145/65    Pulse: (!) 114 (!) 122 (!) 120   Resp: 19 (!) 24 (!) 22   Temp:    98 F (36.7 C)  TempSrc:    Oral  SpO2: 97% 96% 96%   Weight:        Intake/Output Summary (Last 24 hours) at 12/30/2016 0908 Last data filed at 12/30/2016 0600 Gross per 24 hour  Intake 385 ml  Output 550 ml  Net -165 ml   Filed Weights   12/26/16 2000 12/27/16 0500 12/29/16 0500  Weight: 93.6 kg (206 lb 5.6 oz) 91.9 kg (202 lb 9.6 oz) 97.2 kg (214 lb 4.6 oz)    Examination: Exam unchanged when compared to 12/29/16  General exam: Appears calm  and comfortable, in nad.   Respiratory system: Clear to auscultation. Respiratory effort normal. Cardiovascular system: S1 & S2 heard, RRR. No JVD, murmurs, rubs, gallops  Gastrointestinal system: Abdomen is obese, soft and nontender.  Central nervous system: No facial asymmetry, answers questions appropriately, moves extremities equally Extremities: warm, no deformities Skin: No rashes, lesions or ulcers, on limited exam. Psychiatry: Mood & affect appropriate.   Data Reviewed: I have personally reviewed following labs and imaging studies  CBC: Recent Labs  Lab 12/28/16 0756 12/29/16 0408 12/29/16 1057 12/29/16 1432 12/30/16 0401  WBC 11.1* 11.3* 11.5* 12.6* 11.0*  HGB 7.8* 7.5* 7.8* 8.5* 8.3*  HCT 25.0* 24.1* 25.0* 26.8* 26.4*  MCV 98.0 97.6 97.3 94.4 93.6  PLT 221 209 223 195 206   Basic Metabolic Panel: Recent Labs  Lab 12/27/16 0154 12/28/16 0306 12/29/16 0408  NA 132* 136 135  K 4.1 5.0 3.8  CL 99* 106 103  CO2 26 23 25   GLUCOSE 147* 92 103*  BUN 10 13 11   CREATININE 0.95 1.02 0.92  CALCIUM 7.6* 7.8* 8.2*  MG 2.2 2.1 2.0  PHOS 4.1 3.4 2.6   GFR: Estimated Creatinine Clearance: 75.8 mL/min (by C-G formula based on SCr of 0.92 mg/dL). Liver Function Tests: No results for input(s): AST, ALT, ALKPHOS,  BILITOT, PROT, ALBUMIN in the last 168 hours. No results for input(s): LIPASE, AMYLASE in the last 168 hours. No results for input(s): AMMONIA in the last 168 hours. Coagulation Profile: Recent Labs  Lab 12/26/16 2005  INR 1.12   Cardiac Enzymes: No results for input(s): CKTOTAL, CKMB, CKMBINDEX, TROPONINI in the last 168 hours. BNP (last 3 results) No results for input(s): PROBNP in the last 8760 hours. HbA1C: No results for input(s): HGBA1C in the last 72 hours. CBG: Recent Labs  Lab 12/27/16 0724 12/27/16 1229 12/27/16 1605 12/27/16 2038 12/29/16 1543  GLUCAP 125* 98 87 86 95   Lipid Profile: No results for input(s): CHOL, HDL, LDLCALC, TRIG,  CHOLHDL, LDLDIRECT in the last 72 hours. Thyroid Function Tests: No results for input(s): TSH, T4TOTAL, FREET4, T3FREE, THYROIDAB in the last 72 hours. Anemia Panel: No results for input(s): VITAMINB12, FOLATE, FERRITIN, TIBC, IRON, RETICCTPCT in the last 72 hours. Sepsis Labs: Recent Labs  Lab 12/27/16 0929 12/28/16 0306 12/29/16 0408  PROCALCITON <0.10 <0.10 <0.10    Recent Results (from the past 240 hour(s))  MRSA PCR Screening     Status: None   Collection Time: 12/26/16  5:52 PM  Result Value Ref Range Status   MRSA by PCR NEGATIVE NEGATIVE Final    Comment:        The GeneXpert MRSA Assay (FDA approved for NASAL specimens only), is one component of a comprehensive MRSA colonization surveillance program. It is not intended to diagnose MRSA infection nor to guide or monitor treatment for MRSA infections.   Aerobic Culture (superficial specimen)     Status: None   Collection Time: 12/26/16  8:16 PM  Result Value Ref Range Status   Specimen Description WOUND LEFT THIGH  Final   Special Requests NONE  Final   Gram Stain NO WBC SEEN RARE GRAM POSITIVE COCCI IN PAIRS   Final   Culture NORMAL SKIN FLORA  Final   Report Status 12/29/2016 FINAL  Final  Culture, blood (Routine X 2) w Reflex to ID Panel     Status: None (Preliminary result)   Collection Time: 12/27/16  9:40 AM  Result Value Ref Range Status   Specimen Description BLOOD LEFT HAND  Final   Special Requests IN PEDIATRIC BOTTLE Blood Culture adequate volume  Final   Culture NO GROWTH 2 DAYS  Final   Report Status PENDING  Incomplete  Culture, blood (Routine X 2) w Reflex to ID Panel     Status: None (Preliminary result)   Collection Time: 12/27/16  9:45 AM  Result Value Ref Range Status   Specimen Description BLOOD RIGHT ANTECUBITAL  Final   Special Requests IN PEDIATRIC BOTTLE Blood Culture adequate volume  Final   Culture NO GROWTH 2 DAYS  Final   Report Status PENDING  Incomplete  Urine Culture      Status: None   Collection Time: 12/27/16 12:43 PM  Result Value Ref Range Status   Specimen Description URINE, CATHETERIZED  Final   Special Requests NONE  Final   Culture NO GROWTH  Final   Report Status 12/28/2016 FINAL  Final         Radiology Studies: No results found.      Scheduled Meds: . ferrous sulfate  325 mg Oral BID WC  . fluticasone  2 spray Each Nare Daily  . losartan  100 mg Oral Daily  . mouth rinse  15 mL Mouth Rinse BID  . multivitamin with minerals  1 tablet Oral Daily  .  nicotine  21 mg Transdermal Daily  . pantoprazole  40 mg Oral BID  . tamsulosin  0.4 mg Oral QPC supper  . thiamine  100 mg Oral Daily   Or  . thiamine  100 mg Intravenous Daily   Continuous Infusions: . sodium chloride Stopped (12/29/16 1100)     LOS: 4 days    Time spent: > 35 minutes  Penny Pia, MD [facility] Pager [phone]  If 7PM-7AM, please contact night-coverage [email or web address] Password [email or web address] 12/30/2016, 9:08 AM   Addendum Pt has lytic lesion in left humerus per x ray report. Will obtain dedicated film after improvement in respiratory condition

## 2016-12-30 NOTE — Progress Notes (Signed)
Hgb stable at 8.3  Continue daily Protonix 40 mg at discharge.  Note Dr Cena BentonVega added po Iron for anemia.  There are no iron studies, MCV in mid 90s,  do not think he needs this long term and black stools may confuse pt and family as to active GI bleeding.  Dropping this to once a day and would take for at most 1 month.    Made appt with GI APP for 1/7 at 2:30 PM  GI signing off.    Jennye MoccasinSarah Amberli Ruegg PA-C

## 2016-12-30 NOTE — Progress Notes (Signed)
   Patient Name: [name] GenerousJerry F Gerrard Date of Encounter: 12/30/2016, 5:38 PM    Subjective  Confusion and resp distress this AM - on BiPAp for a while    Objective  BP 137/70   Pulse (!) 108   Temp 98.1 F (36.7 C) (Oral)   Resp (!) 31   Wt 214 lb 4.6 oz (97.2 kg)   SpO2 97%   BMI 33.56 kg/m  Awake and alert [place] resp  CXR today shows left humeral lytic lesion - and pulm edema  Ammonia only 34 Hgb stable  Assessment and Plan  Gastric ulcer w/ bleed EtOH cirrhosis Pulm edema/CHF? Lytic lesion left humerus   Spoke to Dr. Cena BentonVega who is aware of left humerus lesion and will eventually f/u with humerus xray but resp status is priority now  He may need 1 more U RBC to help carry O2  Iva Booparl E. Gessner, MD, Antionette FairyFACG [facility] [phone] (pager) 12/30/2016 5:38 PM

## 2016-12-30 NOTE — Progress Notes (Signed)
Entered pt's room, found pt to be tachypneic in the 40's, HR 140's, very rhonchous breath sounds. Pt stated he did not want the bipap mask on. o2 sat dropped into the 70's, pt allowed us to place mask on. Pt alert and oriented, stated that he did not want any ACLS interventions including ETT or drugs if something happened. Notified MD on call, awaiting arrival to bedside.

## 2016-12-30 NOTE — Progress Notes (Signed)
Notified pt wife of pt's wish to be a DNR following discussion with [facility] NP. Pt's wife verbalized understanding and was supportive of decision.

## 2016-12-30 NOTE — Progress Notes (Signed)
On arrival to patient room noted, labored respirations in the 40's, increase WOB, patient unable to talk in complete sentences. Speaking in broken sentences. Significant accessory muscle usage noted.   BBS to ausculation is coarse crackles noted throughout. Per RRT pt placed back on BiPAP, settings increase to aid patient labored respirations and oxygen desaturation. Noted SATs in the 60-70's prior to BiPAP administrations.  MD made aware of patient clinical status.

## 2016-12-31 ENCOUNTER — Inpatient Hospital Stay (HOSPITAL_COMMUNITY): Payer: Medicare Other

## 2016-12-31 DIAGNOSIS — I5033 Acute on chronic diastolic (congestive) heart failure: Secondary | ICD-10-CM

## 2016-12-31 DIAGNOSIS — I10 Essential (primary) hypertension: Secondary | ICD-10-CM

## 2016-12-31 DIAGNOSIS — J9601 Acute respiratory failure with hypoxia: Secondary | ICD-10-CM

## 2016-12-31 LAB — CBC
HEMATOCRIT: 28 % — AB (ref 39.0–52.0)
HEMOGLOBIN: 8.7 g/dL — AB (ref 13.0–17.0)
MCH: 29 pg (ref 26.0–34.0)
MCHC: 31.1 g/dL (ref 30.0–36.0)
MCV: 93.3 fL (ref 78.0–100.0)
Platelets: 178 10*3/uL (ref 150–400)
RBC: 3 MIL/uL — ABNORMAL LOW (ref 4.22–5.81)
RDW: 17.9 % — ABNORMAL HIGH (ref 11.5–15.5)
WBC: 11.9 10*3/uL — ABNORMAL HIGH (ref 4.0–10.5)

## 2016-12-31 LAB — URINALYSIS, ROUTINE W REFLEX MICROSCOPIC
BILIRUBIN URINE: NEGATIVE
Glucose, UA: NEGATIVE mg/dL
Ketones, ur: 15 mg/dL — AB
Leukocytes, UA: NEGATIVE
Nitrite: NEGATIVE
PH: 5.5 (ref 5.0–8.0)
Protein, ur: NEGATIVE mg/dL
SPECIFIC GRAVITY, URINE: 1.02 (ref 1.005–1.030)

## 2016-12-31 LAB — BASIC METABOLIC PANEL
Anion gap: 8 (ref 5–15)
BUN: 11 mg/dL (ref 6–20)
CALCIUM: 8.2 mg/dL — AB (ref 8.9–10.3)
CO2: 32 mmol/L (ref 22–32)
CREATININE: 1.01 mg/dL (ref 0.61–1.24)
Chloride: 97 mmol/L — ABNORMAL LOW (ref 101–111)
Glucose, Bld: 106 mg/dL — ABNORMAL HIGH (ref 65–99)
Potassium: 3.7 mmol/L (ref 3.5–5.1)
SODIUM: 137 mmol/L (ref 135–145)

## 2016-12-31 LAB — URINALYSIS, MICROSCOPIC (REFLEX): SQUAMOUS EPITHELIAL / LPF: NONE SEEN

## 2016-12-31 LAB — MAGNESIUM: Magnesium: 1.7 mg/dL (ref 1.7–2.4)

## 2016-12-31 LAB — ECHOCARDIOGRAM LIMITED: Weight: 3227.53 oz

## 2016-12-31 MED ORDER — MORPHINE SULFATE (PF) 2 MG/ML IV SOLN
2.0000 mg | INTRAVENOUS | Status: DC | PRN
  Administered 2016-12-31 – 2017-01-01 (×7): 2 mg via INTRAVENOUS
  Filled 2016-12-31 (×7): qty 1

## 2016-12-31 MED ORDER — PANTOPRAZOLE SODIUM 40 MG IV SOLR
40.0000 mg | INTRAVENOUS | Status: DC
  Administered 2016-12-31 – 2017-01-06 (×7): 40 mg via INTRAVENOUS
  Filled 2016-12-31 (×10): qty 40

## 2016-12-31 MED ORDER — DILTIAZEM HCL 100 MG IV SOLR
5.0000 mg/h | INTRAVENOUS | Status: DC
  Administered 2016-12-31: 15 mg/h via INTRAVENOUS
  Administered 2016-12-31: 5 mg/h via INTRAVENOUS
  Administered 2017-01-01 (×4): 15 mg/h via INTRAVENOUS
  Filled 2016-12-31 (×7): qty 100

## 2016-12-31 MED ORDER — PIPERACILLIN-TAZOBACTAM 3.375 G IVPB
3.3750 g | Freq: Three times a day (TID) | INTRAVENOUS | Status: DC
  Administered 2016-12-31 – 2017-01-04 (×13): 3.375 g via INTRAVENOUS
  Filled 2016-12-31 (×15): qty 50

## 2016-12-31 MED ORDER — ACETAMINOPHEN 325 MG PO TABS
650.0000 mg | ORAL_TABLET | Freq: Four times a day (QID) | ORAL | Status: DC | PRN
  Administered 2017-01-09: 650 mg via ORAL
  Filled 2016-12-31: qty 2

## 2016-12-31 MED ORDER — LORAZEPAM 2 MG/ML IJ SOLN
1.0000 mg | Freq: Once | INTRAMUSCULAR | Status: AC
  Administered 2016-12-31: 1 mg via INTRAVENOUS

## 2016-12-31 MED ORDER — FUROSEMIDE 10 MG/ML IJ SOLN
60.0000 mg | Freq: Once | INTRAMUSCULAR | Status: AC
  Administered 2016-12-31: 60 mg via INTRAVENOUS
  Filled 2016-12-31: qty 6

## 2016-12-31 MED ORDER — ACETAMINOPHEN 650 MG RE SUPP: 325.0000 mg | RECTAL | Status: DC | PRN

## 2016-12-31 MED ORDER — VANCOMYCIN HCL 10 G IV SOLR
1250.0000 mg | Freq: Two times a day (BID) | INTRAVENOUS | Status: DC
  Administered 2016-12-31 – 2017-01-01 (×4): 1250 mg via INTRAVENOUS
  Filled 2016-12-31 (×6): qty 1250

## 2016-12-31 MED ORDER — POTASSIUM CHLORIDE 10 MEQ/100ML IV SOLN
10.0000 meq | INTRAVENOUS | Status: AC
  Administered 2016-12-31 (×3): 10 meq via INTRAVENOUS
  Filled 2016-12-31 (×3): qty 100

## 2016-12-31 NOTE — Progress Notes (Signed)
Dr Cena BentonVega responded from text - no orders received - would like to be notified if temp reaches 103

## 2016-12-31 NOTE — Progress Notes (Signed)
  Echocardiogram 2D Echocardiogram has been performed.  Technically difficult study. Spoke to nurse about HR. Nurse stated patients HR and RR has not slowed down even after medicine.   [name] [name] 12/31/2016, 9:21 AM

## 2016-12-31 NOTE — Progress Notes (Addendum)
PROGRESS NOTE    [name] [name]  ZOX:[id number] DOB: [birth year] DOA: 12/26/2016 PCP: Ardyth Man, MD    Brief Narrative: 76 y.o. W M with a hx of significant ETOH abuse; per the family ~ 4 drinks of [name] [name] per day and occasionally several bottles a day, who presented to [facility] with weakness, exertional dyspnea  And melanotic stools for several days  Assessment & Plan:   Active Problems:   GI bleed requiring more than 4 units of blood in 24 hours, ICU, or surgery   Melena -  Transfuse if hgb less than 8 with active bleed or less than 7 with no active bleeding. - secondary to ulcer.  - s/p transfusion with last hgb at 8.7 on last check.  Fever of unkown origin - obtained urine, blood cultures - given that patient meets sepsis criteria will place on broad spectrum antibiotics  Addendum:  Atrial fibrillation with RVR - placed on cardizem gtt  Suspected acute diastolic CHF - BNP elevated. Administering lasix IV to try and improve symptom control - most likely due to afib with rvr. Working on rate control - Lasix 40 mg IV bid. Will administer potassium given higher doses of lasix administration  Pt has lytic lesion in left humerus per x ray report. Will obtain dedicated film after improvement in respiratory condition    Antral ulcer, chronic - GI recommending alcohol cessation and PPI indefinitely - H pylori negative  Alcohol Dependence - continue thiamine, folate, encourage alcohol cessation, and CIWA protocol with ativan.    DVT prophylaxis: SCD's Code Status: Full Family Communication: none at bedside. Disposition Plan: tranfer to telemetry today   Consultants:   GI: Dr. Leone Payor   Procedures: s/p endoscopy   Antimicrobials: none   Subjective: Pt has stated he does not want to be intubated. Currently on bipap.  Objective: Vitals:   12/31/16 1058 12/31/16 1100 12/31/16 1135 12/31/16 1200  BP:    (!) 151/80  Pulse: (!) 123 (!) 123  (!) 120    Resp: (!) 46 (!) 42  (!) 33  Temp:   99.3 F (37.4 C)   TempSrc:   Axillary   SpO2: 98% 97%  99%  Weight:        Intake/Output Summary (Last 24 hours) at 12/31/2016 1419 Last data filed at 12/31/2016 1242 Gross per 24 hour  Intake 657.42 ml  Output 2445 ml  Net -1787.58 ml   Filed Weights   12/27/16 0500 12/29/16 0500 12/31/16 0600  Weight: 91.9 kg (202 lb 9.6 oz) 97.2 kg (214 lb 4.6 oz) 91.5 kg (201 lb 11.5 oz)    Examination:   General exam: Appears calm and comfortable, in nad.   Respiratory system: on bipap with increased RR, no wheezes, equal chest rise. Cardiovascular system: S1 & S2 heard, IRR. No murmurs Gastrointestinal system: Abdomen is obese, soft and nontender.  Central nervous system: pt somnolent on bipap difficult exam, responds to noxious stimuli Extremities: warm, no deformities Skin: No rashes, lesions or ulcers, on limited exam. Psychiatry: unable to appropriately assess at this time.  Data Reviewed: I have personally reviewed following labs and imaging studies  CBC: Recent Labs  Lab 12/29/16 0408 12/29/16 1057 12/29/16 1432 12/30/16 0401 12/30/16 2345  WBC 11.3* 11.5* 12.6* 11.0* 11.9*  HGB 7.5* 7.8* 8.5* 8.3* 8.7*  HCT 24.1* 25.0* 26.8* 26.4* 28.0*  MCV 97.6 97.3 94.4 93.6 93.3  PLT 209 223 195 206 178   Basic Metabolic Panel: Recent Labs  Lab 12/27/16 0154 12/28/16 0306 12/29/16 0408  NA 132* 136 135  K 4.1 5.0 3.8  CL 99* 106 103  CO2 26 23 25   GLUCOSE 147* 92 103*  BUN 10 13 11   CREATININE 0.95 1.02 0.92  CALCIUM 7.6* 7.8* 8.2*  MG 2.2 2.1 2.0  PHOS 4.1 3.4 2.6   GFR: Estimated Creatinine Clearance: 73.7 mL/min (by C-G formula based on SCr of 0.92 mg/dL). Liver Function Tests: No results for input(s): AST, ALT, ALKPHOS, BILITOT, PROT, ALBUMIN in the last 168 hours. No results for input(s): LIPASE, AMYLASE in the last 168 hours. Recent Labs  Lab 12/30/16 1202  AMMONIA 34   Coagulation Profile: Recent Labs  Lab  12/26/16 2005  INR 1.12   Cardiac Enzymes: No results for input(s): CKTOTAL, CKMB, CKMBINDEX, TROPONINI in the last 168 hours. BNP (last 3 results) No results for input(s): PROBNP in the last 8760 hours. HbA1C: No results for input(s): HGBA1C in the last 72 hours. CBG: Recent Labs  Lab 12/27/16 0724 12/27/16 1229 12/27/16 1605 12/27/16 2038 12/29/16 1543  GLUCAP 125* 98 87 86 95   Lipid Profile: No results for input(s): CHOL, HDL, LDLCALC, TRIG, CHOLHDL, LDLDIRECT in the last 72 hours. Thyroid Function Tests: No results for input(s): TSH, T4TOTAL, FREET4, T3FREE, THYROIDAB in the last 72 hours. Anemia Panel: No results for input(s): VITAMINB12, FOLATE, FERRITIN, TIBC, IRON, RETICCTPCT in the last 72 hours. Sepsis Labs: Recent Labs  Lab 12/27/16 0929 12/28/16 0306 12/29/16 0408  PROCALCITON <0.10 <0.10 <0.10    Recent Results (from the past 240 hour(s))  MRSA PCR Screening     Status: None   Collection Time: 12/26/16  5:52 PM  Result Value Ref Range Status   MRSA by PCR NEGATIVE NEGATIVE Final    Comment:        The GeneXpert MRSA Assay (FDA approved for NASAL specimens only), is one component of a comprehensive MRSA colonization surveillance program. It is not intended to diagnose MRSA infection nor to guide or monitor treatment for MRSA infections.   Aerobic Culture (superficial specimen)     Status: None   Collection Time: 12/26/16  8:16 PM  Result Value Ref Range Status   Specimen Description WOUND LEFT THIGH  Final   Special Requests NONE  Final   Gram Stain NO WBC SEEN RARE GRAM POSITIVE COCCI IN PAIRS   Final   Culture NORMAL SKIN FLORA  Final   Report Status 12/29/2016 FINAL  Final  Culture, blood (Routine X 2) w Reflex to ID Panel     Status: None (Preliminary result)   Collection Time: 12/27/16  9:40 AM  Result Value Ref Range Status   Specimen Description BLOOD LEFT HAND  Final   Special Requests IN PEDIATRIC BOTTLE Blood Culture adequate  volume  Final   Culture NO GROWTH 3 DAYS  Final   Report Status PENDING  Incomplete  Culture, blood (Routine X 2) w Reflex to ID Panel     Status: None (Preliminary result)   Collection Time: 12/27/16  9:45 AM  Result Value Ref Range Status   Specimen Description BLOOD RIGHT ANTECUBITAL  Final   Special Requests IN PEDIATRIC BOTTLE Blood Culture adequate volume  Final   Culture NO GROWTH 3 DAYS  Final   Report Status PENDING  Incomplete  Urine Culture     Status: None   Collection Time: 12/27/16 12:43 PM  Result Value Ref Range Status   Specimen Description URINE, CATHETERIZED  Final   Special  Requests NONE  Final   Culture NO GROWTH  Final   Report Status 12/28/2016 FINAL  Final         Radiology Studies: Dg Chest Port 1 View  Result Date: 12/31/2016 CLINICAL DATA:  Shortness of breath. EXAM: PORTABLE CHEST 1 VIEW COMPARISON:  Radiograph of December 30, 2016. FINDINGS: Stable cardiomegaly. No pneumothorax is noted. Mild bibasilar atelectasis or infiltrates are noted, right greater than left. No definite pleural effusion is noted. Bony thorax is unremarkable. IMPRESSION: Mild bibasilar subsegmental atelectasis or infiltrates are noted. Electronically Signed   By: Lupita RaiderJames  Green Jr, M.D.   On: 12/31/2016 09:54   Dg Chest Port 1 View  Result Date: 12/30/2016 CLINICAL DATA:  Shortness of breath. EXAM: PORTABLE CHEST 1 VIEW COMPARISON:  12/28/2016 FINDINGS: Bilateral mild diffuse interstitial thickening. No focal consolidation, pleural effusion or pneumothorax. Stable cardiomegaly. No acute osseous abnormality. Lytic lesion in the proximal humeral diaphysis partially visualize measuring 2.7 x 1.1 cm of indeterminate etiology. IMPRESSION: 1. Cardiomegaly with mild pulmonary vascular congestion. 2. Lytic lesion in the proximal humeral diaphysis partially visualize measuring 2.7 x 1.1 cm of indeterminate etiology. Recommend dedicated x-rays of the left humerus. Electronically Signed   By:  Elige KoHetal  Patel   On: 12/30/2016 09:50        Scheduled Meds: . ferrous sulfate  325 mg Oral Daily  . fluticasone  2 spray Each Nare Daily  . furosemide  40 mg Intravenous BID  . furosemide  60 mg Intravenous Once  . mouth rinse  15 mL Mouth Rinse BID  . multivitamin with minerals  1 tablet Oral Daily  . nicotine  21 mg Transdermal Daily  . pantoprazole (PROTONIX) IV  40 mg Intravenous Q24H  . tamsulosin  0.4 mg Oral QPC supper  . thiamine  100 mg Oral Daily   Or  . thiamine  100 mg Intravenous Daily   Continuous Infusions: . sodium chloride Stopped (12/29/16 1100)  . diltiazem (CARDIZEM) infusion 15 mg/hr (12/31/16 1242)  . piperacillin-tazobactam (ZOSYN)  IV 3.375 g (12/31/16 1031)  . potassium chloride    . vancomycin Stopped (12/31/16 1111)     LOS: 5 days    Time spent: > 35 minutes  Penny PiaVEGA, Yasemin Rabon, MD [facility] Pager [phone]  If 7PM-7AM, please contact night-coverage [email or web address] Password [email or web address] 12/31/2016, 2:19 PM

## 2016-12-31 NOTE — Progress Notes (Signed)
Text page to DR Cena BentonVega with Pt's fever of 101 Ax

## 2016-12-31 NOTE — Progress Notes (Signed)
SLP Cancellation Note  Patient Details Name: [name] [name] MRN: [id number] DOB: 02-10-1940   Cancelled treatment:       Reason Eval/Treat Not Completed: Medical issues which prohibited therapy. Pt on BiPAP, not appropriate for POs. Will f/u as able.   Maxcine Hamaiewonsky, Brexley Cutshaw 12/31/2016, 8:53 AM  Maxcine HamLaura Paiewonsky, M.A. CCC-SLP [phone]

## 2016-12-31 NOTE — Progress Notes (Signed)
Text page to DR Cena BentonVega - made aware that pt is more agitated RR 30 to 45 Bipap on 02 sats 95 DR called back with orders

## 2016-12-31 NOTE — Progress Notes (Signed)
Pharmacy Antibiotic Note  [name] [name] is a 76 y.o. male admitted on 12/26/2016 with GIB.  Pharmacy has been consulted for vancomycin and zosyn dosing for possible sepsis. Tmax is 100.9 and WBC is elevated at 11.9. SCr is WNL.   Plan: Vancomycin 1250mg  IV Q12H Zosyn 3.375gm IV Q8H (4 hr inf) F/u renal fxn, C&S, clinical status and trough at SS  Weight: 201 lb 11.5 oz (91.5 kg)  Temp (24hrs), Avg:99.1 F (37.3 C), Min:97.2 F (36.2 C), Max:100.9 F (38.3 C)  Recent Labs  Lab 12/27/16 0154  12/28/16 0306  12/29/16 0408 12/29/16 1057 12/29/16 1432 12/30/16 0401 12/30/16 2345  WBC 9.7   < >  --    < > 11.3* 11.5* 12.6* 11.0* 11.9*  CREATININE 0.95  --  1.02  --  0.92  --   --   --   --    < > = values in this interval not displayed.    Estimated Creatinine Clearance: 73.7 mL/min (by C-G formula based on SCr of 0.92 mg/dL).    No Known Allergies  Antimicrobials this admission: Ceftriaxone 12/16>>12/17 Zosyn 12/17>>12/18; 12/21>> Vanc 12/17>>12/18; 12/21>>  Dose adjustments this admission: N/A  Microbiology results: 12/16 thigh wound>normal skin flora 12/17 blood x 2 - NGTD 12/17 sputum 12/17 urine - NEG 12/16 MRSA - NEG  Thank you for allowing pharmacy to be a part of this patient's care.  Valisa Karpel, Drake LeachRachel Lynn 12/31/2016 9:13 AM

## 2016-12-31 NOTE — Care Management Note (Signed)
Case Management Note  Patient Details  Name: Cristal GenerousJerry F Cummings MRN: [id number] Date of Birth: [birth year]  Subjective/Objective:    Pt admitted with melanotic stools - pt is an excessive alcohol abuser                Action/Plan:  PTA from home.  Pt is on Precedex drip.  CSW consulted for substance abuse.  CM will continue to follow      Expected Discharge Date:                  Expected Discharge Plan:     In-House Referral:  Clinical Social Work  Discharge planning Services  CM Consult  Post Acute Care Choice:    Choice offered to:     DME Arranged:    DME Agency:     HH Arranged:    HH Agency:     Status of Service:     If discussed at [employer] Meetings, dates discussed:    Additional Comments: From home with wife.  Pt is off precedex however requiring IV Cardizem and BIPAP.  CM will continue to follow for discharge needs Cherylann ParrClaxton, Courtlynn Holloman S, RN 12/31/2016, 11:47 AM

## 2017-01-01 ENCOUNTER — Encounter (HOSPITAL_COMMUNITY): Payer: Medicare Other

## 2017-01-01 LAB — BASIC METABOLIC PANEL
ANION GAP: 9 (ref 5–15)
BUN: 13 mg/dL (ref 6–20)
CALCIUM: 8.4 mg/dL — AB (ref 8.9–10.3)
CHLORIDE: 97 mmol/L — AB (ref 101–111)
CO2: 33 mmol/L — AB (ref 22–32)
Creatinine, Ser: 1.02 mg/dL (ref 0.61–1.24)
GFR calc non Af Amer: >60 mL/min (ref >60)
GLUCOSE: 107 mg/dL — AB (ref 65–99)
Potassium: 3.5 mmol/L (ref 3.5–5.1)
Sodium: 139 mmol/L (ref 135–145)

## 2017-01-01 LAB — CULTURE, BLOOD (ROUTINE X 2)
CULTURE: NO GROWTH
Culture: NO GROWTH
SPECIAL REQUESTS: ADEQUATE
Special Requests: ADEQUATE

## 2017-01-01 LAB — URINE CULTURE: Culture: NO GROWTH

## 2017-01-01 MED ORDER — POTASSIUM CHLORIDE 10 MEQ/100ML IV SOLN
10.0000 meq | INTRAVENOUS | Status: AC
  Administered 2017-01-01 (×4): 10 meq via INTRAVENOUS
  Filled 2017-01-01 (×4): qty 100

## 2017-01-01 NOTE — Progress Notes (Signed)
IV ativan not given, wasted 0.485ml with Clarita CraneKeely RN.

## 2017-01-01 NOTE — Progress Notes (Signed)
PROGRESS NOTE    SMITTY ACKERLEY  RKY:[id number] DOB: [birth year] DOA: 12/26/2016 PCP: Ranae Palms, MD    Brief Narrative: 76 y.o. W M with a hx of significant ETOH abuse; per the family ~ 4 drinks of [name] per day and occasionally several bottles a day, who presented to [facility] with weakness, exertional dyspnea  And melanotic stools for several days.  Pt was found to have gastric ulcer (non H pylori) and recommendations for indefinite PPI were given by GI. Hospital course complicated by fever of unknown origin, atrial fibrillation with rvr, and acute on chronic diastolic HF.    Assessment & Plan:   Active Problems:   GI bleed requiring more than 4 units of blood in 24 hours, ICU, or surgery   Melena -  Transfuse if hgb less than 8 with active bleed or less than 7 with no active bleeding. - secondary to ulcer.  - s/p transfusion with last hgb at 8.7 on last check.  Fever of unkown origin - obtained urine, blood cultures. Currently results pending. - Placed on broad spectrum antibiotics given that patient met sepsis criteria (tachypnea, tachycardia, leukocytosis)  Atrial fibrillation with RVR - Pt is currently on cardizem gtt HR is improved currently. Pt on Bipap however and unable to transition to oral cardizem at this time. - supplement potassium  - Unable to anticoagulate due to antral ulcer and Acute blood loss anemia  Suspected acute diastolic CHF - BNP elevated. Administering lasix IV to try and improve symptom control - most likely due to afib with rvr.  - Lasix 40 mg IV bid. Good output on this regimen. Will continue for now.  Pt has lytic lesion in left humerus per x ray report. Will obtain dedicated film after improvement in respiratory condition    Antral ulcer, chronic - GI recommending alcohol cessation and PPI indefinitely - H pylori negative  Alcohol Dependence - continue thiamine, folate, encourage alcohol cessation, and CIWA protocol with ativan.      DVT prophylaxis: SCD's Code Status: Partial (does not want to be intubated) Family Communication: none at bedside. Disposition Plan: Stepdown   Consultants:   GI: Dr. Carlean Purl   Procedures: s/p endoscopy   Antimicrobials: none   Subjective: Currently on Bipap. No new complaints reported.  Objective: Vitals:   01/01/17 0500 01/01/17 0600 01/01/17 0741 01/01/17 0852  BP:  (!) 120/43 (!) 120/43   Pulse:  96 97   Resp:  (!) 23 (!) 28   Temp:    100 F (37.8 C)  TempSrc:    Axillary  SpO2:  96% 93%   Weight: 84.5 kg (186 lb 4.6 oz)       Intake/Output Summary (Last 24 hours) at 01/01/2017 0902 Last data filed at 01/01/2017 0700 Gross per 24 hour  Intake 1101.92 ml  Output 4500 ml  Net -3398.08 ml   Filed Weights   12/29/16 0500 12/31/16 0600 01/01/17 0500  Weight: 97.2 kg (214 lb 4.6 oz) 91.5 kg (201 lb 11.5 oz) 84.5 kg (186 lb 4.6 oz)    Examination:  Exam unchanged when compared to 12/31/16  General exam: Appears calm and comfortable, in nad.   Respiratory system: on bipap with increased RR, no wheezes, equal chest rise. Cardiovascular system: S1 & S2 heard, IRR. No murmurs Gastrointestinal system: Abdomen is obese, soft and nontender.  Central nervous system: pt somnolent on bipap difficult exam, responds to noxious stimuli Extremities: warm, no deformities Skin: No rashes, lesions or ulcers,  on limited exam. Psychiatry: unable to appropriately assess at this time.  Data Reviewed: I have personally reviewed following labs and imaging studies  CBC: Recent Labs  Lab 12/29/16 0408 12/29/16 1057 12/29/16 1432 12/30/16 0401 12/30/16 2345  WBC 11.3* 11.5* 12.6* 11.0* 11.9*  HGB 7.5* 7.8* 8.5* 8.3* 8.7*  HCT 24.1* 25.0* 26.8* 26.4* 28.0*  MCV 97.6 97.3 94.4 93.6 93.3  PLT 209 223 195 206 654   Basic Metabolic Panel: Recent Labs  Lab 12/27/16 0154 12/28/16 0306 12/29/16 0408 12/31/16 1822 01/01/17 0323  NA 132* 136 135 137 139  K 4.1 5.0 3.8  3.7 3.5  CL 99* 106 103 97* 97*  CO2 26 23 25  32 33*  GLUCOSE 147* 92 103* 106* 107*  BUN 10 13 11 11 13   CREATININE 0.95 1.02 0.92 1.01 1.02  CALCIUM 7.6* 7.8* 8.2* 8.2* 8.4*  MG 2.2 2.1 2.0 1.7  --   PHOS 4.1 3.4 2.6  --   --    GFR: Estimated Creatinine Clearance: 64.1 mL/min (by C-G formula based on SCr of 1.02 mg/dL). Liver Function Tests: No results for input(s): AST, ALT, ALKPHOS, BILITOT, PROT, ALBUMIN in the last 168 hours. No results for input(s): LIPASE, AMYLASE in the last 168 hours. Recent Labs  Lab 12/30/16 1202  AMMONIA 34   Coagulation Profile: Recent Labs  Lab 12/26/16 2005  INR 1.12   Cardiac Enzymes: No results for input(s): CKTOTAL, CKMB, CKMBINDEX, TROPONINI in the last 168 hours. BNP (last 3 results) No results for input(s): PROBNP in the last 8760 hours. HbA1C: No results for input(s): HGBA1C in the last 72 hours. CBG: Recent Labs  Lab 12/27/16 0724 12/27/16 1229 12/27/16 1605 12/27/16 2038 12/29/16 1543  GLUCAP 125* 98 87 86 95   Lipid Profile: No results for input(s): CHOL, HDL, LDLCALC, TRIG, CHOLHDL, LDLDIRECT in the last 72 hours. Thyroid Function Tests: No results for input(s): TSH, T4TOTAL, FREET4, T3FREE, THYROIDAB in the last 72 hours. Anemia Panel: No results for input(s): VITAMINB12, FOLATE, FERRITIN, TIBC, IRON, RETICCTPCT in the last 72 hours. Sepsis Labs: Recent Labs  Lab 12/27/16 0929 12/28/16 0306 12/29/16 0408  PROCALCITON <0.10 <0.10 <0.10    Recent Results (from the past 240 hour(s))  MRSA PCR Screening     Status: None   Collection Time: 12/26/16  5:52 PM  Result Value Ref Range Status   MRSA by PCR NEGATIVE NEGATIVE Final    Comment:        The GeneXpert MRSA Assay (FDA approved for NASAL specimens only), is one component of a comprehensive MRSA colonization surveillance program. It is not intended to diagnose MRSA infection nor to guide or monitor treatment for MRSA infections.   Aerobic Culture  (superficial specimen)     Status: None   Collection Time: 12/26/16  8:16 PM  Result Value Ref Range Status   Specimen Description WOUND LEFT THIGH  Final   Special Requests NONE  Final   Gram Stain NO WBC SEEN RARE GRAM POSITIVE COCCI IN PAIRS   Final   Culture NORMAL SKIN FLORA  Final   Report Status 12/29/2016 FINAL  Final  Culture, blood (Routine X 2) w Reflex to ID Panel     Status: None (Preliminary result)   Collection Time: 12/27/16  9:40 AM  Result Value Ref Range Status   Specimen Description BLOOD LEFT HAND  Final   Special Requests IN PEDIATRIC BOTTLE Blood Culture adequate volume  Final   Culture NO GROWTH 4 DAYS  Final   Report Status PENDING  Incomplete  Culture, blood (Routine X 2) w Reflex to ID Panel     Status: None (Preliminary result)   Collection Time: 12/27/16  9:45 AM  Result Value Ref Range Status   Specimen Description BLOOD RIGHT ANTECUBITAL  Final   Special Requests IN PEDIATRIC BOTTLE Blood Culture adequate volume  Final   Culture NO GROWTH 4 DAYS  Final   Report Status PENDING  Incomplete  Urine Culture     Status: None   Collection Time: 12/27/16 12:43 PM  Result Value Ref Range Status   Specimen Description URINE, CATHETERIZED  Final   Special Requests NONE  Final   Culture NO GROWTH  Final   Report Status 12/28/2016 FINAL  Final  Culture, Urine     Status: None   Collection Time: 12/31/16  9:34 AM  Result Value Ref Range Status   Specimen Description URINE, RANDOM  Final   Special Requests NONE  Final   Culture NO GROWTH  Final   Report Status 01/01/2017 FINAL  Final         Radiology Studies: Dg Chest Port 1 View  Result Date: 12/31/2016 CLINICAL DATA:  Shortness of breath. EXAM: PORTABLE CHEST 1 VIEW COMPARISON:  Radiograph of December 30, 2016. FINDINGS: Stable cardiomegaly. No pneumothorax is noted. Mild bibasilar atelectasis or infiltrates are noted, right greater than left. No definite pleural effusion is noted. Bony thorax is  unremarkable. IMPRESSION: Mild bibasilar subsegmental atelectasis or infiltrates are noted. Electronically Signed   By: Marijo Conception, M.D.   On: 12/31/2016 09:54   Dg Chest Port 1 View  Result Date: 12/30/2016 CLINICAL DATA:  Shortness of breath. EXAM: PORTABLE CHEST 1 VIEW COMPARISON:  12/28/2016 FINDINGS: Bilateral mild diffuse interstitial thickening. No focal consolidation, pleural effusion or pneumothorax. Stable cardiomegaly. No acute osseous abnormality. Lytic lesion in the proximal humeral diaphysis partially visualize measuring 2.7 x 1.1 cm of indeterminate etiology. IMPRESSION: 1. Cardiomegaly with mild pulmonary vascular congestion. 2. Lytic lesion in the proximal humeral diaphysis partially visualize measuring 2.7 x 1.1 cm of indeterminate etiology. Recommend dedicated x-rays of the left humerus. Electronically Signed   By: Kathreen Devoid   On: 12/30/2016 09:50     Scheduled Meds: . ferrous sulfate  325 mg Oral Daily  . fluticasone  2 spray Each Nare Daily  . furosemide  40 mg Intravenous BID  . mouth rinse  15 mL Mouth Rinse BID  . multivitamin with minerals  1 tablet Oral Daily  . nicotine  21 mg Transdermal Daily  . pantoprazole (PROTONIX) IV  40 mg Intravenous Q24H  . tamsulosin  0.4 mg Oral QPC supper  . thiamine  100 mg Oral Daily   Or  . thiamine  100 mg Intravenous Daily   Continuous Infusions: . sodium chloride Stopped (12/29/16 1100)  . diltiazem (CARDIZEM) infusion 15 mg/hr (01/01/17 0700)  . piperacillin-tazobactam (ZOSYN)  IV Stopped (01/01/17 0700)  . vancomycin Stopped (01/01/17 0145)     LOS: 6 days    Time spent: > 35 minutes  Velvet Bathe, MD [facility] Pager [phone]  If 7PM-7AM, please contact night-coverage [email or web address] Password [email or web address] 01/01/2017, 9:02 AM

## 2017-01-01 NOTE — Progress Notes (Signed)
Nursing note: During PM assessment patient alert to person, place, time, and situation.  Multiple times Mr [name] [name] asked me to remove the BiPap.  I explained to him that if the BiPap was removed he may/or may not pass away due to lack of oxygen.  He reported "I don't care.  I'm tired of this.  I just want to die.  Just please let me die."  Had been advised during shift change that the family had noted that Ativan administration was actually making his confusion worse.  I then called the doctor on call for his recommendations.  He advised to administer Ativan and Morphine as ordered for pain and agitation.  Administered 1mg  IV Ativan and patient fell asleep and stopped pulling on his tubes.    Family called at 0055 to ask about patient and how he was doing.  I reported his distress and that we were trying to keep him comfortable as possible.  I did report to his sister on the phone what he had told me during shift change and she then reported to me "You tell him he can't just give up.  That they weren't going to give up and let him die."  I did advise her that he was alert and oriented and could at this time make his own decisions.  She then reported to me "Well, I'll be up there first thing in the morning and I'll talk some sense into him."    Will continue to monitor and treat patient appropriately.

## 2017-01-02 ENCOUNTER — Inpatient Hospital Stay (HOSPITAL_COMMUNITY): Payer: Medicare Other

## 2017-01-02 ENCOUNTER — Encounter (HOSPITAL_COMMUNITY): Payer: Medicare Other

## 2017-01-02 ENCOUNTER — Other Ambulatory Visit: Payer: Self-pay

## 2017-01-02 DIAGNOSIS — I5031 Acute diastolic (congestive) heart failure: Secondary | ICD-10-CM | POA: Diagnosis not present

## 2017-01-02 DIAGNOSIS — K922 Gastrointestinal hemorrhage, unspecified: Secondary | ICD-10-CM

## 2017-01-02 DIAGNOSIS — G9340 Encephalopathy, unspecified: Secondary | ICD-10-CM | POA: Diagnosis not present

## 2017-01-02 DIAGNOSIS — K259 Gastric ulcer, unspecified as acute or chronic, without hemorrhage or perforation: Secondary | ICD-10-CM | POA: Diagnosis present

## 2017-01-02 DIAGNOSIS — M899 Disorder of bone, unspecified: Secondary | ICD-10-CM | POA: Diagnosis present

## 2017-01-02 DIAGNOSIS — I4891 Unspecified atrial fibrillation: Secondary | ICD-10-CM | POA: Clinically undetermined

## 2017-01-02 DIAGNOSIS — I482 Chronic atrial fibrillation: Secondary | ICD-10-CM

## 2017-01-02 DIAGNOSIS — K254 Chronic or unspecified gastric ulcer with hemorrhage: Principal | ICD-10-CM

## 2017-01-02 DIAGNOSIS — R509 Fever, unspecified: Secondary | ICD-10-CM | POA: Diagnosis not present

## 2017-01-02 LAB — BASIC METABOLIC PANEL
ANION GAP: 6 (ref 5–15)
ANION GAP: 8 (ref 5–15)
BUN: 16 mg/dL (ref 6–20)
BUN: 18 mg/dL (ref 6–20)
CHLORIDE: 93 mmol/L — AB (ref 101–111)
CHLORIDE: 95 mmol/L — AB (ref 101–111)
CO2: 41 mmol/L — AB (ref 22–32)
CO2: 39 mmol/L — AB (ref 22–32)
CREATININE: 1.11 mg/dL (ref 0.61–1.24)
Calcium: 8.2 mg/dL — ABNORMAL LOW (ref 8.9–10.3)
Calcium: 8.3 mg/dL — ABNORMAL LOW (ref 8.9–10.3)
Creatinine, Ser: 1.05 mg/dL (ref 0.61–1.24)
GFR calc non Af Amer: >60 mL/min (ref >60)
GFR calc non Af Amer: >60 mL/min (ref >60)
Glucose, Bld: 121 mg/dL — ABNORMAL HIGH (ref 65–99)
Glucose, Bld: 105 mg/dL — ABNORMAL HIGH (ref 65–99)
Potassium: 3.4 mmol/L — ABNORMAL LOW (ref 3.5–5.1)
Potassium: 3 mmol/L — ABNORMAL LOW (ref 3.5–5.1)
Sodium: 140 mmol/L (ref 135–145)
Sodium: 142 mmol/L (ref 135–145)

## 2017-01-02 LAB — BLOOD GAS, ARTERIAL
Acid-Base Excess: 14.6 mmol/L — ABNORMAL HIGH (ref 0.0–2.0)
Bicarbonate: 40.2 mmol/L — ABNORMAL HIGH (ref 20.0–28.0)
Delivery systems: POSITIVE
Drawn by: 518061
EXPIRATORY PAP: 6
FIO2: 0.5
INSPIRATORY PAP: 12
Mode: POSITIVE
O2 SAT: 97.4 %
PATIENT TEMPERATURE: 98.6
PCO2 ART: 67 mmHg — AB (ref 32.0–48.0)
PO2 ART: 90.6 mmHg (ref 83.0–108.0)
pH, Arterial: 7.396 (ref 7.350–7.450)

## 2017-01-02 LAB — CBC
HEMATOCRIT: 27.9 % — AB (ref 39.0–52.0)
HEMOGLOBIN: 8.3 g/dL — AB (ref 13.0–17.0)
MCH: 28.8 pg (ref 26.0–34.0)
MCHC: 29.7 g/dL — AB (ref 30.0–36.0)
MCV: 96.9 fL (ref 78.0–100.0)
Platelets: 144 10*3/uL — ABNORMAL LOW (ref 150–400)
RBC: 2.88 MIL/uL — ABNORMAL LOW (ref 4.22–5.81)
RDW: 17.6 % — ABNORMAL HIGH (ref 11.5–15.5)
WBC: 7.2 10*3/uL (ref 4.0–10.5)

## 2017-01-02 LAB — TSH: TSH: 0.9 u[IU]/mL (ref 0.350–4.500)

## 2017-01-02 LAB — MAGNESIUM: Magnesium: 1.7 mg/dL (ref 1.7–2.4)

## 2017-01-02 LAB — AMMONIA: AMMONIA: 17 umol/L (ref 9–35)

## 2017-01-02 MED ORDER — MORPHINE SULFATE (PF) 4 MG/ML IV SOLN
2.0000 mg | INTRAVENOUS | Status: DC | PRN
  Administered 2017-01-02 – 2017-01-06 (×3): 2 mg via INTRAVENOUS
  Filled 2017-01-02 (×4): qty 1

## 2017-01-02 MED ORDER — CHLORDIAZEPOXIDE HCL 25 MG PO CAPS
25.0000 mg | ORAL_CAPSULE | ORAL | Status: DC
  Filled 2017-01-02: qty 1

## 2017-01-02 MED ORDER — CHLORDIAZEPOXIDE HCL 25 MG PO CAPS
25.0000 mg | ORAL_CAPSULE | Freq: Three times a day (TID) | ORAL | Status: AC
  Administered 2017-01-04 (×3): 25 mg via ORAL
  Filled 2017-01-02 (×3): qty 1

## 2017-01-02 MED ORDER — LOPERAMIDE HCL 2 MG PO CAPS
2.0000 mg | ORAL_CAPSULE | ORAL | Status: AC | PRN
  Administered 2017-01-02: 2 mg via ORAL
  Filled 2017-01-02: qty 1

## 2017-01-02 MED ORDER — POTASSIUM CHLORIDE 10 MEQ/100ML IV SOLN
INTRAVENOUS | Status: AC
  Filled 2017-01-02: qty 100

## 2017-01-02 MED ORDER — IPRATROPIUM-ALBUTEROL 0.5-2.5 (3) MG/3ML IN SOLN
3.0000 mL | Freq: Three times a day (TID) | RESPIRATORY_TRACT | Status: DC
  Administered 2017-01-02 – 2017-01-07 (×16): 3 mL via RESPIRATORY_TRACT
  Filled 2017-01-02 (×16): qty 3

## 2017-01-02 MED ORDER — POTASSIUM CHLORIDE 10 MEQ/100ML IV SOLN
INTRAVENOUS | Status: AC
  Administered 2017-01-02: 10 meq
  Filled 2017-01-02: qty 100

## 2017-01-02 MED ORDER — ONDANSETRON 4 MG PO TBDP: 4.0000 mg | ORAL_TABLET | Freq: Four times a day (QID) | ORAL | Status: AC | PRN

## 2017-01-02 MED ORDER — BUDESONIDE 0.5 MG/2ML IN SUSP
0.5000 mg | Freq: Two times a day (BID) | RESPIRATORY_TRACT | Status: DC
  Administered 2017-01-02 – 2017-01-17 (×31): 0.5 mg via RESPIRATORY_TRACT
  Filled 2017-01-02 (×31): qty 2

## 2017-01-02 MED ORDER — POTASSIUM CHLORIDE 10 MEQ/100ML IV SOLN
10.0000 meq | INTRAVENOUS | Status: AC
  Administered 2017-01-02 (×3): 10 meq via INTRAVENOUS
  Filled 2017-01-02 (×2): qty 100

## 2017-01-02 MED ORDER — CHLORDIAZEPOXIDE HCL 25 MG PO CAPS: 25.0000 mg | ORAL_CAPSULE | Freq: Every day | ORAL | Status: DC

## 2017-01-02 MED ORDER — CHLORDIAZEPOXIDE HCL 25 MG PO CAPS
25.0000 mg | ORAL_CAPSULE | Freq: Four times a day (QID) | ORAL | Status: AC
  Administered 2017-01-02 – 2017-01-03 (×6): 25 mg via ORAL
  Filled 2017-01-02 (×6): qty 1

## 2017-01-02 MED ORDER — CHLORDIAZEPOXIDE HCL 25 MG PO CAPS
25.0000 mg | ORAL_CAPSULE | Freq: Four times a day (QID) | ORAL | Status: DC | PRN
  Administered 2017-01-02 – 2017-01-03 (×2): 25 mg via ORAL
  Filled 2017-01-02 (×2): qty 1

## 2017-01-02 MED ORDER — HYDROXYZINE HCL 25 MG PO TABS
25.0000 mg | ORAL_TABLET | Freq: Four times a day (QID) | ORAL | Status: AC | PRN
  Administered 2017-01-02: 25 mg via ORAL
  Filled 2017-01-02 (×2): qty 1

## 2017-01-02 MED ORDER — DILTIAZEM HCL ER COATED BEADS 120 MG PO CP24
120.0000 mg | ORAL_CAPSULE | Freq: Every morning | ORAL | Status: DC
  Administered 2017-01-02 – 2017-01-17 (×16): 120 mg via ORAL
  Filled 2017-01-02 (×16): qty 1

## 2017-01-02 MED ORDER — MAGNESIUM SULFATE 4 GM/100ML IV SOLN
4.0000 g | Freq: Once | INTRAVENOUS | Status: AC
Start: 2017-01-02 — End: 2017-01-02
  Administered 2017-01-02: 4 g via INTRAVENOUS
  Filled 2017-01-02: qty 100

## 2017-01-02 NOTE — Progress Notes (Signed)
PROGRESS NOTE    [name] [name]  ZOX:[id number] DOB: [birth year] DOA: 12/26/2016 PCP: Ardyth Man, MD    Brief Narrative:  77 y.o. W M with a hx of significant ETOH abuse; per the family ~ 4 drinks of [name] per day and occasionally several bottles a day, who presented to [facility] with weakness, exertional dyspnea And melanotic stools for several days.  Pt was found to have gastric ulcer (non H pylori) and recommendations for indefinite PPI were given by GI. Hospital course complicated by fever of unknown origin, atrial fibrillation with rvr, and acute on chronic diastolic HF.       Assessment & Plan:   Principal Problem:   Acute respiratory failure with hypoxia (HCC) Active Problems:   GI bleed requiring more than 4 units of blood in 24 hours, ICU, or surgery   Acute diastolic CHF (congestive heart failure) (HCC)   Melena   Antral ulcer, chronic   Acute blood loss anemia   Alcoholic cirrhosis of liver without ascites (HCC)   SOB (shortness of breath)   Fever   A-fib (HCC)   Gastric ulcer: Per EGD 12/27/2016   Lytic lesion of bone on x-ray: Left humeral   Acute encephalopathy   UGI bleed  #1 acute respiratory failure with hypoxia Questionable etiology.  Initially felt patient was likely in volume overload per chest x-ray findings and elevated BNP and patient also noted to be in A. fib with RVR.  Patient currently on the BiPAP with sats of 100%.  Check a chest x-ray.  Check a ABG.  Trial of BiPAP.  She with a urine output of 2.175 L over the past 24 hours.  Patient is -2.6 L during this hospitalization.  Continue IV Lasix for now.  Follow.  2.  Probable acute on chronic diastolic CHF exacerbation Questionable etiology.  Felt likely triggered by A. fib with RVR versus volume overload from packed red blood cell transfusions.  Chest x-ray was noted to have cardiomegaly and mild pulmonary vascular congestion 12/30/2016.  NP noted on 12/30/2016 to be elevated at 262.4.   Patient on BiPAP.  Trial of BiPAP.  Patient on IV Lasix with a urine output of 2.175 L over the past 24 hours.  Patient is -2.6 L during this hospitalization.  Current weight is 201 pounds (01/02/2017) from a weight of 214 pounds on 12/29/2016.  [name] on 08/12/2016 at cardiologist office was 207 pounds.  Continue IV Lasix.  Follow.  3.  Chronic atrial fibrillation Patient noted to be in A. fib with RVR and had to be placed on the Cardizem drip.  Heart rate improved currently in the 70s.  Patient on BiPAP.  Discontinue Cardizem drip and resume home regimen of Cardizem 120 mg daily.  Anticoagulation on hold due to recent GI bleed.  Follow.  4.  Acute upper GI bleed secondary to gastric ulcer Patient denies any further tarry stools.  Patient status post upper endoscopy which showed gastric ulcer which was negative for H. pylori and benign per biopsy.  Upper endoscopy showed no signs of portal gastropathy or varices.  Pathology suggested ulcerative gastritis.  Status post 1 unit packed red blood cells.  Hemoglobin stable at 8.3.  Continue PPI.  Outpatient follow-up with GI.  5.  Acute blood loss anemia Secondary to problem #4.  Status post transfusion 1 unit packed red blood cells.  Continue PPI.  Hemoglobin currently stable at 8.3.  Follow H&H.  6.  Acute encephalopathy Patient difficult to arouse.  Patient on BiPAP.  Check ABG.  Check an ammonia level.  Follow.  7.  Hypokalemia Secondary to diuresis.  Replete.  8.  Fever Questionable etiology.  Urinalysis unremarkable.  Repeat chest x-ray has prior chest x-ray worrisome for volume overload.  Cultures from 12/27/2016 are negative.  Blood cultures from 12/31/2016 with no growth to date.  Urine cultures were negative.  MRSA PCR negative.  Discontinue vancomycin for now.  Continue empiric Zosyn.  9.  Left humeral lytic lesion Check dedicated humeral films.  Follow.  10.  Alcoholic cirrhosis Check an ammonia  level.  Per upper endoscopy no signs of portal gastropathy or varices.  Outpatient follow-up with GI.  11.  Alcohol dependence Continue thiamine, folate, Ativan withdrawal protocol.  Alcohol cessation.    DVT prophylaxis: SCDs Code Status: DNR Family Communication: No family at bedside. Disposition Plan: Remain in stepdown unit.  Likely home versus SNF, once medically stable and at baseline.   Consultants:   Gastroenterology: Dr. Leone PayorGessner 12/27/2016  Procedures:   Chest x-ray 12/27/2016, 12/28/2016, 12/30/2016, 12/31/2016  2D echo 12/31/2016  Upper endoscopy 12/27/2016 per Dr. Leone PayorGessner, nonbleeding gastric ulcer noted  Transfused 1 unit packed red blood cells 12/29/2016  Antimicrobials:   IV Zosyn 12/27/2016>>>> 12/28/2016  IV Zosyn 12/31/2016  IV vancomycin 12/27/2016>>>>> 12/28/2016  IV vancomycin 12/31/2016   Subjective: Patient on BiPAP, drowsy.  Opens eyes to noxious stimuli and answer some questions however drifts back off to sleep.  Patient denies any hematemesis.  Patient denies any further rectal bleeding.  Objective: Vitals:   01/02/17 0600 01/02/17 0843 01/02/17 0906 01/02/17 0908  BP: (!) 98/45 118/61  (!) 100/46  Pulse: (!) 47 75  75  Resp: (!) 22 (!) 22  (!) 25  Temp: 97.7 F (36.5 C) 98.5 F (36.9 C)    TempSrc: Axillary Oral    SpO2: 99% 99% 99% 99%  Weight:      Height:        Intake/Output Summary (Last 24 hours) at 01/02/2017 1049 Last data filed at 01/02/2017 0600 Gross per 24 hour  Intake 638.67 ml  Output 2050 ml  Net -1411.33 ml   Filed Weights   12/31/16 0600 01/01/17 0500 01/02/17 0420  Weight: 91.5 kg (201 lb 11.5 oz) 84.5 kg (186 lb 4.6 oz) 91.2 kg (201 lb 1 oz)    Examination:  General exam: On Bipap Respiratory system: Some bibasilar crackles noted.  No rhonchi.  On BiPAP.   Cardiovascular system: Irregularly irregular.No murmurs, rubs, gallops or clicks. No pedal edema. Gastrointestinal system: Abdomen is  nondistended, soft and nontender. No organomegaly or masses felt. Normal bowel sounds heard. Central nervous system: On BiPAP.  Drowsy.  Answers some questions to noxious stimuli.  Open extremities spontaneously. Extremities: Symmetric 5 x 5 power. Skin: No rashes, lesions or ulcers Psychiatry: Judgement and insight appear poor. Mood & affect appropriate.     Data Reviewed: I have personally reviewed following labs and imaging studies  CBC: Recent Labs  Lab 12/29/16 1057 12/29/16 1432 12/30/16 0401 12/30/16 2345 01/02/17 0032  WBC 11.5* 12.6* 11.0* 11.9* 7.2  HGB 7.8* 8.5* 8.3* 8.7* 8.3*  HCT 25.0* 26.8* 26.4* 28.0* 27.9*  MCV 97.3 94.4 93.6 93.3 96.9  PLT 223 195 206 178 144*   Basic Metabolic Panel: Recent Labs  Lab 12/27/16 0154 12/28/16 0306 12/29/16 0408 12/31/16 1822 01/01/17 0323 01/02/17 0032 01/02/17 0915  NA 132* 136 135 137 139 140 142  K 4.1  5.0 3.8 3.7 3.5 3.4* 3.0*  CL 99* 106 103 97* 97* 93* 95*  CO2 26 23 25  32 33* 41* 39*  GLUCOSE 147* 92 103* 106* 107* 121* 105*  BUN 10 13 11 11 13 16 18   CREATININE 0.95 1.02 0.92 1.01 1.02 1.11 1.05  CALCIUM 7.6* 7.8* 8.2* 8.2* 8.4* 8.2* 8.3*  MG 2.2 2.1 2.0 1.7  --   --  1.7  PHOS 4.1 3.4 2.6  --   --   --   --    GFR: Estimated Creatinine Clearance: 65.7 mL/min (by C-G formula based on SCr of 1.05 mg/dL). Liver Function Tests: No results for input(s): AST, ALT, ALKPHOS, BILITOT, PROT, ALBUMIN in the last 168 hours. No results for input(s): LIPASE, AMYLASE in the last 168 hours. Recent Labs  Lab 12/30/16 1202  AMMONIA 34   Coagulation Profile: Recent Labs  Lab 12/26/16 2005  INR 1.12   Cardiac Enzymes: No results for input(s): CKTOTAL, CKMB, CKMBINDEX, TROPONINI in the last 168 hours. BNP (last 3 results) No results for input(s): PROBNP in the last 8760 hours. HbA1C: No results for input(s): HGBA1C in the last 72 hours. CBG: Recent Labs  Lab 12/27/16 0724 12/27/16 1229 12/27/16 1605  12/27/16 2038 12/29/16 1543  GLUCAP 125* 98 87 86 95   Lipid Profile: No results for input(s): CHOL, HDL, LDLCALC, TRIG, CHOLHDL, LDLDIRECT in the last 72 hours. Thyroid Function Tests: No results for input(s): TSH, T4TOTAL, FREET4, T3FREE, THYROIDAB in the last 72 hours. Anemia Panel: No results for input(s): VITAMINB12, FOLATE, FERRITIN, TIBC, IRON, RETICCTPCT in the last 72 hours. Sepsis Labs: Recent Labs  Lab 12/27/16 0929 12/28/16 0306 12/29/16 0408  PROCALCITON <0.10 <0.10 <0.10    Recent Results (from the past 240 hour(s))  MRSA PCR Screening     Status: None   Collection Time: 12/26/16  5:52 PM  Result Value Ref Range Status   MRSA by PCR NEGATIVE NEGATIVE Final    Comment:        The GeneXpert MRSA Assay (FDA approved for NASAL specimens only), is one component of a comprehensive MRSA colonization surveillance program. It is not intended to diagnose MRSA infection nor to guide or monitor treatment for MRSA infections.   Aerobic Culture (superficial specimen)     Status: None   Collection Time: 12/26/16  8:16 PM  Result Value Ref Range Status   Specimen Description WOUND LEFT THIGH  Final   Special Requests NONE  Final   Gram Stain NO WBC SEEN RARE GRAM POSITIVE COCCI IN PAIRS   Final   Culture NORMAL SKIN FLORA  Final   Report Status 12/29/2016 FINAL  Final  Culture, blood (Routine X 2) w Reflex to ID Panel     Status: None   Collection Time: 12/27/16  9:40 AM  Result Value Ref Range Status   Specimen Description BLOOD LEFT HAND  Final   Special Requests IN PEDIATRIC BOTTLE Blood Culture adequate volume  Final   Culture NO GROWTH 5 DAYS  Final   Report Status 01/01/2017 FINAL  Final  Culture, blood (Routine X 2) w Reflex to ID Panel     Status: None   Collection Time: 12/27/16  9:45 AM  Result Value Ref Range Status   Specimen Description BLOOD RIGHT ANTECUBITAL  Final   Special Requests IN PEDIATRIC BOTTLE Blood Culture adequate volume  Final    Culture NO GROWTH 5 DAYS  Final   Report Status 01/01/2017 FINAL  Final  Urine  Culture     Status: None   Collection Time: 12/27/16 12:43 PM  Result Value Ref Range Status   Specimen Description URINE, CATHETERIZED  Final   Special Requests NONE  Final   Culture NO GROWTH  Final   Report Status 12/28/2016 FINAL  Final  Culture, blood (Routine X 2) w Reflex to ID Panel     Status: None (Preliminary result)   Collection Time: 12/31/16  9:28 AM  Result Value Ref Range Status   Specimen Description BLOOD RIGHT HAND  Final   Special Requests   Final    BOTTLES DRAWN AEROBIC AND ANAEROBIC Blood Culture adequate volume   Culture NO GROWTH 1 DAY  Final   Report Status PENDING  Incomplete  Culture, Urine     Status: None   Collection Time: 12/31/16  9:34 AM  Result Value Ref Range Status   Specimen Description URINE, RANDOM  Final   Special Requests NONE  Final   Culture NO GROWTH  Final   Report Status 01/01/2017 FINAL  Final  Culture, blood (Routine X 2) w Reflex to ID Panel     Status: None (Preliminary result)   Collection Time: 12/31/16  9:35 AM  Result Value Ref Range Status   Specimen Description BLOOD LEFT HAND  Final   Special Requests   Final    BOTTLES DRAWN AEROBIC AND ANAEROBIC Blood Culture results may not be optimal due to an inadequate volume of blood received in culture bottles   Culture NO GROWTH 1 DAY  Final   Report Status PENDING  Incomplete         Radiology Studies: Dg Chest Port 1 View  Result Date: 01/02/2017 CLINICAL DATA:  Shortness of breath. EXAM: PORTABLE CHEST 1 VIEW COMPARISON:  Radiograph of December 31, 2016. FINDINGS: Stable cardiomegaly. No pneumothorax or pleural effusion is noted. Stable bibasilar subsegmental atelectasis or infiltrates are noted. Bony thorax is unremarkable. IMPRESSION: Stable bibasilar subsegmental atelectasis or infiltrates. Electronically Signed   By: Lupita RaiderJames  Green Jr, M.D.   On: 01/02/2017 10:35        Scheduled  Meds: . budesonide (PULMICORT) nebulizer solution  0.5 mg Nebulization BID  . diltiazem  120 mg Oral q morning - 10a  . ferrous sulfate  325 mg Oral Daily  . fluticasone  2 spray Each Nare Daily  . furosemide  40 mg Intravenous BID  . ipratropium-albuterol  3 mL Nebulization TID  . mouth rinse  15 mL Mouth Rinse BID  . multivitamin with minerals  1 tablet Oral Daily  . nicotine  21 mg Transdermal Daily  . pantoprazole (PROTONIX) IV  40 mg Intravenous Q24H  . tamsulosin  0.4 mg Oral QPC supper  . thiamine  100 mg Oral Daily   Or  . thiamine  100 mg Intravenous Daily   Continuous Infusions: . sodium chloride Stopped (12/29/16 1100)  . magnesium sulfate 1 - 4 g bolus IVPB    . piperacillin-tazobactam (ZOSYN)  IV Stopped (01/02/17 0515)  . potassium chloride 10 mEq (01/02/17 0916)  . vancomycin Stopped (01/01/17 2331)     LOS: 7 days    Time spent: 40 minutes    Ramiro Harvestaniel Jamus Loving, MD [facility] Pager [phone]  If 7PM-7AM, please contact night-coverage [email or web address] Password [email or web address] 01/02/2017, 10:49 AM

## 2017-01-02 NOTE — Progress Notes (Signed)
Disregard last note due to error in charting.

## 2017-01-02 NOTE — Progress Notes (Signed)
Gave patient a small bit of applesauce. Patient spat it out and told me he didn't want it. Patient also refused meds. Patient stating, "I don't care what you do to me. My family knows everything that's going on and there's going to be a lot of lawsuits. I'm just going to die in this bed." Patient alert and oriented X2. Will continue to monitor.

## 2017-01-02 NOTE — Progress Notes (Signed)
Patient has been struggling with anxiety and agitation during shift. Tried calming techniques such as deep breathing exercises and distraction without success. Tried anti-anxiety medications with little success.  Will continue to monitor.

## 2017-01-03 ENCOUNTER — Inpatient Hospital Stay (HOSPITAL_COMMUNITY): Payer: Medicare Other

## 2017-01-03 DIAGNOSIS — R609 Edema, unspecified: Secondary | ICD-10-CM

## 2017-01-03 LAB — MAGNESIUM: Magnesium: 1.8 mg/dL (ref 1.7–2.4)

## 2017-01-03 LAB — COMPREHENSIVE METABOLIC PANEL WITH GFR
ALT: 13 U/L — ABNORMAL LOW (ref 17–63)
AST: 23 U/L (ref 15–41)
Albumin: 2 g/dL — ABNORMAL LOW (ref 3.5–5.0)
Alkaline Phosphatase: 47 U/L (ref 38–126)
Anion gap: 8 (ref 5–15)
BUN: 17 mg/dL (ref 6–20)
CO2: 37 mmol/L — ABNORMAL HIGH (ref 22–32)
Calcium: 8 mg/dL — ABNORMAL LOW (ref 8.9–10.3)
Chloride: 90 mmol/L — ABNORMAL LOW (ref 101–111)
Creatinine, Ser: 1.03 mg/dL (ref 0.61–1.24)
GFR calc Af Amer: >60 mL/min
GFR calc non Af Amer: >60 mL/min
Glucose, Bld: 116 mg/dL — ABNORMAL HIGH (ref 65–99)
Potassium: 3 mmol/L — ABNORMAL LOW (ref 3.5–5.1)
Sodium: 135 mmol/L (ref 135–145)
Total Bilirubin: 0.6 mg/dL (ref 0.3–1.2)
Total Protein: 5 g/dL — ABNORMAL LOW (ref 6.5–8.1)

## 2017-01-03 LAB — CBC
HEMATOCRIT: 24.6 % — AB (ref 39.0–52.0)
HEMOGLOBIN: 7.6 g/dL — AB (ref 13.0–17.0)
MCH: 28.8 pg (ref 26.0–34.0)
MCHC: 30.9 g/dL (ref 30.0–36.0)
MCV: 93.2 fL (ref 78.0–100.0)
Platelets: 136 10*3/uL — ABNORMAL LOW (ref 150–400)
RBC: 2.64 MIL/uL — AB (ref 4.22–5.81)
RDW: 17 % — ABNORMAL HIGH (ref 11.5–15.5)
WBC: 6.4 10*3/uL (ref 4.0–10.5)

## 2017-01-03 LAB — HIV ANTIBODY (ROUTINE TESTING W REFLEX): HIV Screen 4th Generation wRfx: NONREACTIVE

## 2017-01-03 LAB — RPR: RPR Ser Ql: NONREACTIVE

## 2017-01-03 MED ORDER — MAGNESIUM SULFATE 4 GM/100ML IV SOLN
4.0000 g | Freq: Once | INTRAVENOUS | Status: AC
  Administered 2017-01-03: 4 g via INTRAVENOUS
  Filled 2017-01-03: qty 100

## 2017-01-03 MED ORDER — HALOPERIDOL LACTATE 5 MG/ML IJ SOLN
3.0000 mg | Freq: Once | INTRAMUSCULAR | Status: AC
  Administered 2017-01-03: 3 mg via INTRAVENOUS
  Filled 2017-01-03: qty 1

## 2017-01-03 MED ORDER — POTASSIUM CHLORIDE CRYS ER 20 MEQ PO TBCR
40.0000 meq | EXTENDED_RELEASE_TABLET | ORAL | Status: AC
  Administered 2017-01-03 (×2): 40 meq via ORAL
  Filled 2017-01-03 (×2): qty 2

## 2017-01-03 NOTE — Progress Notes (Signed)
PROGRESS NOTE    CAMEO SHEWELL  ZOX:[id number] DOB: [birth year] DOA: 12/26/2016 PCP: Ardyth Man, MD    Brief Narrative:  76 y.o. W M with a hx of significant ETOH abuse; per the family ~ 4 drinks of [name] per day and occasionally several bottles a day, who presented to [facility] with weakness, exertional dyspnea And melanotic stools for several days.  Pt was found to have gastric ulcer (non H pylori) and recommendations for indefinite PPI were given by GI. Hospital course complicated by fever of unknown origin, atrial fibrillation with rvr, and acute on chronic diastolic HF.       Assessment & Plan:   Principal Problem:   Acute respiratory failure with hypoxia (HCC) Active Problems:   GI bleed requiring more than 4 units of blood in 24 hours, ICU, or surgery   Acute diastolic CHF (congestive heart failure) (HCC)   Melena   Antral ulcer, chronic   Acute blood loss anemia   Alcoholic cirrhosis of liver without ascites (HCC)   SOB (shortness of breath)   Fever   A-fib (HCC)   Gastric ulcer: Per EGD 12/27/2016   Lytic lesion of bone on x-ray: Left humeral   Acute encephalopathy   UGI bleed  #1 acute respiratory failure with hypoxia Questionable etiology.  Initially felt patient was likely in volume overload per chest x-ray findings and elevated BNP and patient also noted to be in A. fib with RVR.  Patient currently off the BiPAP with sats of 96% on 4 L nasal cannula.  ABG with a normal pH with a elevated PCO2 and normal O2.  Patient tolerated trial of BiPAP.  Good urine output.  Change IV Lasix to oral Lasix.  Follow.  2.  Probable acute on chronic diastolic CHF exacerbation Questionable etiology.  Felt likely triggered by A. fib with RVR versus volume overload from packed red blood cell transfusions.  Chest x-ray was noted to have cardiomegaly and mild pulmonary vascular congestion 12/30/2016.  NP noted on 12/30/2016 to be elevated at 262.4.  Patient off BiPAP.   Patient on IV Lasix with a urine output of 1.210 L over the past 24 hours.  Current weight is 187 pounds from 201 pounds (01/02/2017) from a weight of 214 pounds on 12/29/2016.  [name] on 08/12/2016 at cardiologist office was 207 pounds.  Change IV Lasix back to home dose oral Lasix. Follow.  3.  Chronic atrial fibrillation Patient noted to be in A. fib with RVR and had to be placed on the Cardizem drip.  Heart rate improved currently in the 90s.  Patient off BiPAP.  Discontinued Cardizem drip and resumed home regimen of Cardizem 120 mg daily.  Anticoagulation on hold due to recent GI bleed.  Follow.  4.  Acute upper GI bleed secondary to gastric ulcer Patient denies any further tarry stools.  Patient status post upper endoscopy which showed gastric ulcer which was negative for H. pylori and benign per biopsy.  Upper endoscopy showed no signs of portal gastropathy or varices.  Pathology suggested ulcerative gastritis.  Status post 1 unit packed red blood cells.  Hemoglobin stable at 7.6.  Continue PPI.  Outpatient follow-up with GI.  5.  Acute blood loss anemia Secondary to problem #4.  Status post transfusion 1 unit packed red blood cells.  Continue PPI.  Hemoglobin currently at 7.6 from 8.3.  Follow H&H.  6.  Acute encephalopathy Patient alert, somewhat agitated and yelling out  loud.  Patient however alert to self and place.  Thinks his [id number].  Knows it's the end of December. Knows who the president is.  Ammonia levels within normal limits.  TSH within normal limits.  Thiamine levels pending.  RPR nonreactive.  HIV pending.  RBC folate pending.  Patient currently off BiPAP.  ABG noted and normal pH with a PCO2 in the 60s. Follow.  7.  Hypokalemia Secondary to diuresis.  Replete.  8.  Fever Questionable etiology.  Urinalysis unremarkable.  Repeat chest x-ray has prior chest x-ray worrisome for volume overload.  Cultures from 12/27/2016 are negative.  Blood  cultures from 12/31/2016 with no growth to date.  Urine cultures were negative.  MRSA PCR negative.  Fever curve trending down.  Discontinued vancomycin for now.  Continue empiric Zosyn through today and discontinue after today's doses..  9.  Left humeral lytic lesion Dedicated humeral films with lytic lesion.  We will check a SPEP, UPEP, skeletal survey.  Follow.  10.  Alcoholic cirrhosis Ammonia levels within normal limits. Per upper endoscopy no signs of portal gastropathy or varices.  Outpatient follow-up with GI.  11.  Alcohol dependence Per family patient has been abstinent for close to 1-2 months.  Patient noted to be agitated yesterday.  Continue thiamine, folate. Alcohol cessation.  Ativan withdrawal protocol was discontinued and patient now on Librium detox protocol.    DVT prophylaxis: SCDs Code Status: DNR Family Communication: No family at bedside.  Updated family yesterday. Disposition Plan: Transfer to telemetry.  Likely home versus SNF, once medically stable and at baseline.   Consultants:   Gastroenterology: Dr. Leone PayorGessner 12/27/2016  Procedures:   Chest x-ray 12/27/2016, 12/28/2016, 12/30/2016, 12/31/2016  2D echo 12/31/2016  Upper endoscopy 12/27/2016 per Dr. Leone PayorGessner, nonbleeding gastric ulcer noted  Transfused 1 unit packed red blood cells 12/29/2016  Antimicrobials:   IV Zosyn 12/27/2016>>>> 12/28/2016  IV Zosyn 12/31/2016  IV vancomycin 12/27/2016>>>>> 12/28/2016   Subjective: Patient screaming out in the room somewhat agitated.  Patient alert to self and place.  Thinks is 1919.  Knows the president is Trump.  Patient states he is going home today.  Patient denies any shortness of breath.  No chest pain.  No abdominal pain.  No further bleeding per patient.    Objective: Vitals:   01/03/17 0400 01/03/17 0500 01/03/17 0728 01/03/17 0840  BP: 122/81 118/67 125/68   Pulse: 100 (!) 106 (!) 102   Resp: (!) 23 (!) 27 (!) 29   Temp:   97.8 F (36.6 C)     TempSrc:   Oral   SpO2: 95% 94% 94% 95%  Weight:      Height:        Intake/Output Summary (Last 24 hours) at 01/03/2017 1028 Last data filed at 01/03/2017 0600 Gross per 24 hour  Intake 3453 ml  Output 950 ml  Net 2503 ml   Filed Weights   01/01/17 0500 01/02/17 0420 01/03/17 0322  Weight: 84.5 kg (186 lb 4.6 oz) 91.2 kg (201 lb 1 oz) 84.9 kg (187 lb 2.7 oz)    Examination:  General exam: On [place]. Somewhat agitated. Respiratory system: Decreased bibasilar crackles noted.  Minimal expiratory wheezing.  No rhonchi. Cardiovascular system: Irregularly irregular.No murmurs, rubs, gallops or clicks. No pedal edema. Gastrointestinal system: Abdomen is nondistended, soft and nontender. No organomegaly or masses felt. Normal bowel sounds heard. Central nervous system: Alert to self and place.  Thinks is 1918.  Moving extremities spontaneously.  Answering questions appropriately.  Extremities: Symmetric 5 x 5 power. Skin: No rashes, lesions or ulcers Psychiatry: Judgement and insight appear poor-fair. Mood & affect appropriate.     Data Reviewed: I have personally reviewed following labs and imaging studies  CBC: Recent Labs  Lab 12/29/16 1432 12/30/16 0401 12/30/16 2345 01/02/17 0032 01/03/17 0450  WBC 12.6* 11.0* 11.9* 7.2 6.4  HGB 8.5* 8.3* 8.7* 8.3* 7.6*  HCT 26.8* 26.4* 28.0* 27.9* 24.6*  MCV 94.4 93.6 93.3 96.9 93.2  PLT 195 206 178 144* 136*   Basic Metabolic Panel: Recent Labs  Lab 12/28/16 0306 12/29/16 0408 12/31/16 1822 01/01/17 0323 01/02/17 0032 01/02/17 0915 01/03/17 0450  NA 136 135 137 139 140 142 135  K 5.0 3.8 3.7 3.5 3.4* 3.0* 3.0*  CL 106 103 97* 97* 93* 95* 90*  CO2 23 25 32 33* 41* 39* 37*  GLUCOSE 92 103* 106* 107* 121* 105* 116*  BUN 13 11 11 13 16 18 17   CREATININE 1.02 0.92 1.01 1.02 1.11 1.05 1.03  CALCIUM 7.8* 8.2* 8.2* 8.4* 8.2* 8.3* 8.0*  MG 2.1 2.0 1.7  --   --  1.7 1.8  PHOS 3.4 2.6  --   --   --   --   --    GFR: Estimated  Creatinine Clearance: 67 mL/min (by C-G formula based on SCr of 1.03 mg/dL). Liver Function Tests: Recent Labs  Lab 01/03/17 0450  AST 23  ALT 13*  ALKPHOS 47  BILITOT 0.6  PROT 5.0*  ALBUMIN 2.0*   No results for input(s): LIPASE, AMYLASE in the last 168 hours. Recent Labs  Lab 12/30/16 1202 01/02/17 1128  AMMONIA 34 17   Coagulation Profile: No results for input(s): INR, PROTIME in the last 168 hours. Cardiac Enzymes: No results for input(s): CKTOTAL, CKMB, CKMBINDEX, TROPONINI in the last 168 hours. BNP (last 3 results) No results for input(s): PROBNP in the last 8760 hours. HbA1C: No results for input(s): HGBA1C in the last 72 hours. CBG: Recent Labs  Lab 12/27/16 1229 12/27/16 1605 12/27/16 2038 12/29/16 1543  GLUCAP 98 87 86 95   Lipid Profile: No results for input(s): CHOL, HDL, LDLCALC, TRIG, CHOLHDL, LDLDIRECT in the last 72 hours. Thyroid Function Tests: Recent Labs    01/02/17 1648  TSH 0.900   Anemia Panel: No results for input(s): VITAMINB12, FOLATE, FERRITIN, TIBC, IRON, RETICCTPCT in the last 72 hours. Sepsis Labs: Recent Labs  Lab 12/28/16 0306 12/29/16 0408  PROCALCITON <0.10 <0.10    Recent Results (from the past 240 hour(s))  MRSA PCR Screening     Status: None   Collection Time: 12/26/16  5:52 PM  Result Value Ref Range Status   MRSA by PCR NEGATIVE NEGATIVE Final    Comment:        The GeneXpert MRSA Assay (FDA approved for NASAL specimens only), is one component of a comprehensive MRSA colonization surveillance program. It is not intended to diagnose MRSA infection nor to guide or monitor treatment for MRSA infections.   Aerobic Culture (superficial specimen)     Status: None   Collection Time: 12/26/16  8:16 PM  Result Value Ref Range Status   Specimen Description WOUND LEFT THIGH  Final   Special Requests NONE  Final   Gram Stain NO WBC SEEN RARE GRAM POSITIVE COCCI IN PAIRS   Final   Culture NORMAL SKIN FLORA   Final   Report Status 12/29/2016 FINAL  Final  Culture, blood (Routine X 2) w Reflex to ID Panel  Status: None   Collection Time: 12/27/16  9:40 AM  Result Value Ref Range Status   Specimen Description BLOOD LEFT HAND  Final   Special Requests IN PEDIATRIC BOTTLE Blood Culture adequate volume  Final   Culture NO GROWTH 5 DAYS  Final   Report Status 01/01/2017 FINAL  Final  Culture, blood (Routine X 2) w Reflex to ID Panel     Status: None   Collection Time: 12/27/16  9:45 AM  Result Value Ref Range Status   Specimen Description BLOOD RIGHT ANTECUBITAL  Final   Special Requests IN PEDIATRIC BOTTLE Blood Culture adequate volume  Final   Culture NO GROWTH 5 DAYS  Final   Report Status 01/01/2017 FINAL  Final  Urine Culture     Status: None   Collection Time: 12/27/16 12:43 PM  Result Value Ref Range Status   Specimen Description URINE, CATHETERIZED  Final   Special Requests NONE  Final   Culture NO GROWTH  Final   Report Status 12/28/2016 FINAL  Final  Culture, blood (Routine X 2) w Reflex to ID Panel     Status: None (Preliminary result)   Collection Time: 12/31/16  9:28 AM  Result Value Ref Range Status   Specimen Description BLOOD RIGHT HAND  Final   Special Requests   Final    BOTTLES DRAWN AEROBIC AND ANAEROBIC Blood Culture adequate volume   Culture NO GROWTH 2 DAYS  Final   Report Status PENDING  Incomplete  Culture, Urine     Status: None   Collection Time: 12/31/16  9:34 AM  Result Value Ref Range Status   Specimen Description URINE, RANDOM  Final   Special Requests NONE  Final   Culture NO GROWTH  Final   Report Status 01/01/2017 FINAL  Final  Culture, blood (Routine X 2) w Reflex to ID Panel     Status: None (Preliminary result)   Collection Time: 12/31/16  9:35 AM  Result Value Ref Range Status   Specimen Description BLOOD LEFT HAND  Final   Special Requests   Final    BOTTLES DRAWN AEROBIC AND ANAEROBIC Blood Culture results may not be optimal due to an  inadequate volume of blood received in culture bottles   Culture NO GROWTH 2 DAYS  Final   Report Status PENDING  Incomplete         Radiology Studies: Dg Chest Port 1 View  Result Date: 01/02/2017 CLINICAL DATA:  Shortness of breath. EXAM: PORTABLE CHEST 1 VIEW COMPARISON:  Radiograph of December 31, 2016. FINDINGS: Stable cardiomegaly. No pneumothorax or pleural effusion is noted. Stable bibasilar subsegmental atelectasis or infiltrates are noted. Bony thorax is unremarkable. IMPRESSION: Stable bibasilar subsegmental atelectasis or infiltrates. Electronically Signed   By: Lupita RaiderJames  Green Jr, M.D.   On: 01/02/2017 10:35   Dg Humerus Left  Result Date: 01/02/2017 CLINICAL DATA:  Lytic bone lesion on radiography EXAM: LEFT HUMERUS - 2+ VIEW COMPARISON:  None FINDINGS: Proximal to the left deltoid insertion site along the proximal humeral diaphysis there is a 3.4 by 0.8 cm cortical lucent lesion. No overlying periosteal reaction. Chronic irregular appearing spurring along the common extensor tendon and common flexor tendon sites along the elbow. IMPRESSION: 1. Cortical proximal diaphyseal humeral lucency with fairly sharp zone of transition just proximal to the deltoid insertion site. Possibilities might include myeloma, metastatic disease, lymphoma, an old nonossifying fibroma. Correlate with any serologic indicators of myeloma and consider further skeletal workup or humerus MRI. Electronically Signed  By: Gaylyn Rong M.D.   On: 01/02/2017 12:42        Scheduled Meds: . budesonide (PULMICORT) nebulizer solution  0.5 mg Nebulization BID  . chlordiazePOXIDE  25 mg Oral QID   Followed by  . [START ON 01/04/2017] chlordiazePOXIDE  25 mg Oral TID   Followed by  . [START ON 01/05/2017] chlordiazePOXIDE  25 mg Oral BH-qamhs   Followed by  . [START ON 01/06/2017] chlordiazePOXIDE  25 mg Oral Daily  . diltiazem  120 mg Oral q morning - 10a  . ferrous sulfate  325 mg Oral Daily  .  fluticasone  2 spray Each Nare Daily  . furosemide  40 mg Intravenous BID  . ipratropium-albuterol  3 mL Nebulization TID  . mouth rinse  15 mL Mouth Rinse BID  . multivitamin with minerals  1 tablet Oral Daily  . nicotine  21 mg Transdermal Daily  . pantoprazole (PROTONIX) IV  40 mg Intravenous Q24H  . potassium chloride  40 mEq Oral Q4H  . tamsulosin  0.4 mg Oral QPC supper  . thiamine  100 mg Oral Daily   Or  . thiamine  100 mg Intravenous Daily   Continuous Infusions: . sodium chloride Stopped (12/29/16 1100)  . magnesium sulfate 1 - 4 g bolus IVPB 4 g (01/03/17 0840)  . piperacillin-tazobactam (ZOSYN)  IV 3.375 g (01/03/17 0840)     LOS: 8 days    Time spent: 40 minutes    Ramiro Harvest, MD [facility] Pager [phone]  If 7PM-7AM, please contact night-coverage [email or web address] Password [facility] 01/03/2017, 10:28 AM

## 2017-01-03 NOTE — Plan of Care (Signed)
Patient became increasingly agitated and irritated through out shift. Patient requesting to leave AMA, making attempts to get out of bed and threatening to pull out IV lines and urinary cathter .    Bodenheimer, NP made aware, new ordered given. Will continue to monitor

## 2017-01-03 NOTE — Care Management Important Message (Signed)
Important Message  Patient Details  Name: Cristal GenerousJerry F Agnes MRN: [id number] Date of Birth: 02-May-1940   Medicare Important Message Given:  Yes    [name], Catarino Vold Abena 01/03/2017, 9:27 AM

## 2017-01-03 NOTE — Progress Notes (Signed)
Pharmacy Antibiotic Note  [name] [name] is a 76 y.o. male admitted on 12/26/2016 with GIB.  Pharmacy has been consulted for zosyn dosing for fevers. Patient is now afebrile and WBC WNL. Scr- 1.03. Per MD note, Zosyn to stop after today's doses.   Plan: Continue Zosyn 3.375gm IV Q8H (4 hr inf) through today  Monitor antibiotic stop and clinical improvement  Height: 6' (182.9 cm) Weight: 187 lb 2.7 oz (84.9 kg) IBW/kg (Calculated) : 77.6  Temp (24hrs), Avg:98.2 F (36.8 C), Min:97.8 F (36.6 C), Max:98.5 F (36.9 C)  Recent Labs  Lab 12/29/16 1432 12/30/16 0401 12/30/16 2345 12/31/16 1822 01/01/17 0323 01/02/17 0032 01/02/17 0915 01/03/17 0450  WBC 12.6* 11.0* 11.9*  --   --  7.2  --  6.4  CREATININE  --   --   --  1.01 1.02 1.11 1.05 1.03    Estimated Creatinine Clearance: 67 mL/min (by C-G formula based on SCr of 1.03 mg/dL).    No Known Allergies  Antimicrobials this admission: Ceftriaxone 12/16>>12/17 Zosyn 12/17>>12/18; 12/21>>(12/25) Vanc 12/17>>12/18; 12/21>>12/22  Dose adjustments this admission: N/A  Microbiology results: 12/16 thigh wound>normal skin flora 12/17 blood x 2 - NGTD 12/17 sputum 12/17 urine - NEG 12/16 MRSA - NEG 12/21 Urine - NEG 12/21 Blood: NGTD  Thank you for allowing pharmacy to be a part of this patient's care.  Della GooEmily S Ramesses Crampton, PharmD, BCPS PGY2 Infectious Diseases Pharmacy Resident Pager: [phone]  01/03/2017 11:24 AM

## 2017-01-03 NOTE — Progress Notes (Signed)
SLP Cancellation Note  Patient Details Name: [name] [name] MRN: [id number] DOB: 1940/03/25   Cancelled treatment:       Reason Eval/Treat Not Completed: Patient at procedure or test/unavailable. Will continue efforts.   Maxcine Hamaiewonsky, Alacia Rehmann 01/03/2017, 11:11 AM  Maxcine HamLaura Paiewonsky, M.A. CCC-SLP [phone]

## 2017-01-03 NOTE — Progress Notes (Signed)
*  Preliminary Results* Right lower extremity venous duplex completed. Right lower extremity is negative for deep vein thrombosis. There is no evidence of right Baker's cyst.  01/03/2017 3:03 PM  Gertie FeyMichelle Dayten Juba, BS, RVT, RDCS, RDMS

## 2017-01-04 LAB — CBC
HCT: 24.6 % — ABNORMAL LOW (ref 39.0–52.0)
Hemoglobin: 7.6 g/dL — ABNORMAL LOW (ref 13.0–17.0)
MCH: 28.7 pg (ref 26.0–34.0)
MCHC: 30.9 g/dL (ref 30.0–36.0)
MCV: 92.8 fL (ref 78.0–100.0)
PLATELETS: 134 10*3/uL — AB (ref 150–400)
RBC: 2.65 MIL/uL — AB (ref 4.22–5.81)
RDW: 17.3 % — AB (ref 11.5–15.5)
WBC: 5.2 10*3/uL (ref 4.0–10.5)

## 2017-01-04 LAB — BASIC METABOLIC PANEL
ANION GAP: 7 (ref 5–15)
BUN: 12 mg/dL (ref 6–20)
CALCIUM: 7.9 mg/dL — AB (ref 8.9–10.3)
CO2: 39 mmol/L — AB (ref 22–32)
CREATININE: 0.97 mg/dL (ref 0.61–1.24)
Chloride: 89 mmol/L — ABNORMAL LOW (ref 101–111)
GFR calc Af Amer: >60 mL/min (ref >60)
GLUCOSE: 126 mg/dL — AB (ref 65–99)
Potassium: 3 mmol/L — ABNORMAL LOW (ref 3.5–5.1)
Sodium: 135 mmol/L (ref 135–145)

## 2017-01-04 LAB — RAPID URINE DRUG SCREEN, HOSP PERFORMED
Amphetamines: NOT DETECTED
Barbiturates: NOT DETECTED
Benzodiazepines: POSITIVE — AB
Cocaine: NOT DETECTED
OPIATES: POSITIVE — AB
Tetrahydrocannabinol: NOT DETECTED

## 2017-01-04 LAB — MAGNESIUM: Magnesium: 2.2 mg/dL (ref 1.7–2.4)

## 2017-01-04 MED ORDER — POTASSIUM CHLORIDE CRYS ER 20 MEQ PO TBCR
40.0000 meq | EXTENDED_RELEASE_TABLET | ORAL | Status: AC
  Administered 2017-01-04 (×3): 40 meq via ORAL
  Filled 2017-01-04 (×3): qty 2

## 2017-01-04 NOTE — Progress Notes (Signed)
PROGRESS NOTE    [name] [name]  YWV:[id number] DOB: [birth year] DOA: 12/26/2016 PCP: Ranae Palms, MD    Brief Narrative:  76 y.o. W M with a hx of significant ETOH abuse; per the family ~ 4 drinks of [name] per day and occasionally several bottles a day, who presented to [facility] with weakness, exertional dyspnea And melanotic stools for several days.  Pt was found to have gastric ulcer (non H pylori) and recommendations for indefinite PPI were given by GI. Hospital course complicated by fever of unknown origin, atrial fibrillation with rvr, and acute on chronic diastolic HF.       Assessment & Plan:   Principal Problem:   Acute respiratory failure with hypoxia (HCC) Active Problems:   GI bleed requiring more than 4 units of blood in 24 hours, ICU, or surgery   Acute diastolic CHF (congestive heart failure) (HCC)   Melena   Antral ulcer, chronic   Acute blood loss anemia   Alcoholic cirrhosis of liver without ascites (HCC)   SOB (shortness of breath)   Fever   A-fib (HCC)   Gastric ulcer: Per EGD 12/27/2016   Lytic lesion of bone on x-ray: Left humeral   Acute encephalopathy   UGI bleed  #1 acute respiratory failure with hypoxia Questionable etiology.  Initially felt patient was likely in volume overload per chest x-ray findings and elevated BNP and patient also noted to be in A. fib with RVR.  Patient currently off the BiPAP with sats of 96% on 4 L nasal cannula.  ABG with a normal pH with a elevated PCO2 and normal O2.  Patient currently off BiPAP.  On nasal cannula.  Continue IV Lasix through today and transition to oral Lasix tomorrow.   Follow.  2.  Probable acute on chronic diastolic CHF exacerbation Questionable etiology.  Felt likely triggered by A. fib with RVR versus volume overload from packed red blood cell transfusions.  Chest x-ray was noted to have cardiomegaly and mild pulmonary vascular congestion 12/30/2016.  NP noted on 12/30/2016 to be elevated  at 262.4.  Patient off BiPAP.  Patient on IV Lasix with a urine output of 2.3 L over the past 24 hours.  Current weight is 181 pounds from 187 pounds from 201 pounds (01/02/2017) from a weight of 214 pounds on 12/29/2016.  [name] on 08/12/2016 at cardiologist office was 207 pounds.  Continue IV Lasix and likely transition back to home dose oral Lasix tomorrow.    3.  Chronic atrial fibrillation Patient noted to be in A. fib with RVR and had to be placed on the Cardizem drip.  Heart rate improved currently in the 90s.  Patient off BiPAP.  Discontinued Cardizem drip and resumed home regimen of Cardizem 120 mg daily.  Anticoagulation on hold due to recent GI bleed.  Follow.  4.  Acute upper GI bleed secondary to gastric ulcer Patient denies any further tarry stools.  Patient status post upper endoscopy which showed gastric ulcer which was negative for H. pylori and benign per biopsy.  Upper endoscopy showed no signs of portal gastropathy or varices.  Pathology suggested ulcerative gastritis.  Status post 1 unit packed red blood cells.  Hemoglobin stable at 7.6.  Continue PPI.  Outpatient follow-up with GI.  5.  Acute blood loss anemia Secondary to problem #4.  Status post transfusion 1 unit packed red blood cells.  Continue PPI.  Hemoglobin currently at 7.6 from 7.6 from 8.3.  Follow H&H.  6.  Acute encephalopathy Patient drowsy today however less agitated.  Patient noted to have gone to bed at 3 AM.  Patient opens eyes to verbal stimuli following commands appropriately. Knows who the president is.  Ammonia levels within normal limits.  TSH within normal limits.  Thiamine levels pending.  RPR nonreactive.  HIV reactive.  RBC folate pending.  Patient currently off BiPAP.  ABG noted and normal pH with a PCO2 in the 60s. Follow.  7.  Hypokalemia Secondary to diuresis.  Magnesium level at 2.2.  Replete.  8.  Fever Questionable etiology.  Urinalysis unremarkable.   Repeat chest x-ray has prior chest x-ray worrisome for volume overload.  Cultures from 12/27/2016 are negative.  Blood cultures from 12/31/2016 with no growth to date.  Urine cultures were negative.  MRSA PCR negative.  Fever curve trending down.  Discontinued vancomycin for now.  Discontinue IV Zosyn.  Patient status post 1 week of empiric antibiotics.   9.  Lytic lesions in left humerus, left fibula and skull /??  Myeloma  Dedicated humeral films with lytic lesion.  Skeletal survey with lytic lesions in the left humerus, left fibular and skull.  Concern for myeloma.  SPEP UPEP pending.  Will likely need outpatient follow-up with hematology/oncology.   10.  Alcoholic cirrhosis Ammonia levels within normal limits. Per upper endoscopy no signs of portal gastropathy or varices.  Outpatient follow-up with GI.  11.  Alcohol dependence Per family patient has been abstinent for close to 1-2 months.  Patient noted to be agitated however slowly improving.  Patient drowsy..  Continue thiamine, folate. Alcohol cessation.  Ativan withdrawal protocol was discontinued and patient now on Librium detox protocol.    DVT prophylaxis: SCDs Code Status: DNR Family Communication: No family at bedside.  Disposition Plan: Likely home versus SNF, once medically stable and patient improved and at baseline.   Consultants:   Gastroenterology: Dr. Carlean Purl 12/27/2016  Procedures:   Chest x-ray 12/27/2016, 12/28/2016, 12/30/2016, 12/31/2016  2D echo 12/31/2016  Upper endoscopy 12/27/2016 per Dr. Carlean Purl, nonbleeding gastric ulcer noted  Transfused 1 unit packed red blood cells 12/29/2016  Right lower extremity Dopplers 01/03/2017--- negative for DVT, negative for Baker's cyst  Skeletal survey 01/03/2017  Antimicrobials:   IV Zosyn 12/27/2016>>>> 12/28/2016  IV Zosyn 12/31/2016>>>>>> 01/04/2017  IV vancomycin 12/27/2016>>>>> 12/28/2016   Subjective: Patient noted to be drowsy this morning.  However  per nursing patient went to bed around 3 AM.  Patient opens eyes to verbal stimuli and following commands.  She denies any chest pain or shortness of breath.  Patient drifts back off to sleep.  No bleeding noted per nursing.  Objective: Vitals:   01/03/17 2122 01/04/17 0001 01/04/17 0542 01/04/17 0732  BP:  (!) 143/46 117/63   Pulse:  (!) 105 (!) 54   Resp:  18 20   Temp:  98.4 F (36.9 C) 98.3 F (36.8 C)   TempSrc:  Oral Oral   SpO2: 91% 90% 100% 98%  Weight:   82.5 kg (181 lb 14.1 oz)   Height:        Intake/Output Summary (Last 24 hours) at 01/04/2017 1024 Last data filed at 01/04/2017 0713 Gross per 24 hour  Intake 505 ml  Output 2300 ml  Net -1795 ml   Filed Weights   01/03/17 0322 01/03/17 1356 01/04/17 0542  Weight: 84.9 kg (187 lb 2.7 oz) 84 kg (185 lb 3 oz) 82.5 kg (181 lb 14.1 oz)    Examination:  General exam: Drowsy Respiratory system: Decreased bibasilar crackles noted.  Minimal expiratory wheezing.  No rhonchi. Cardiovascular system: Irregularly irregular.No murmurs, rubs, gallops or clicks. No pedal edema. Gastrointestinal system: Abdomen is soft, nontender, nondistended, positive bowel sounds.  No hepatosplenomegaly.  Central nervous system: Drowsy this morning.  Answering some questions appropriately.  Moving extremities spontaneously.  Extremities: Symmetric 5 x 5 power. Skin: No rashes, lesions or ulcers Psychiatry: Judgement and insight appear poor-fair. Mood & affect appropriate.     Data Reviewed: I have personally reviewed following labs and imaging studies  CBC: Recent Labs  Lab 12/30/16 0401 12/30/16 2345 01/02/17 0032 01/03/17 0450 01/04/17 0538  WBC 11.0* 11.9* 7.2 6.4 5.2  HGB 8.3* 8.7* 8.3* 7.6* 7.6*  HCT 26.4* 28.0* 27.9* 24.6* 24.6*  MCV 93.6 93.3 96.9 93.2 92.8  PLT 206 178 144* 136* 824*   Basic Metabolic Panel: Recent Labs  Lab 12/29/16 0408 12/31/16 1822 01/01/17 0323 01/02/17 0032 01/02/17 0915 01/03/17 0450  01/04/17 0538  NA 135 137 139 140 142 135 135  K 3.8 3.7 3.5 3.4* 3.0* 3.0* 3.0*  CL 103 97* 97* 93* 95* 90* 89*  CO2 25 32 33* 41* 39* 37* 39*  GLUCOSE 103* 106* 107* 121* 105* 116* 126*  BUN 11 11 13 16 18 17 12   CREATININE 0.92 1.01 1.02 1.11 1.05 1.03 0.97  CALCIUM 8.2* 8.2* 8.4* 8.2* 8.3* 8.0* 7.9*  MG 2.0 1.7  --   --  1.7 1.8 2.2  PHOS 2.6  --   --   --   --   --   --    GFR: Estimated Creatinine Clearance: 66.6 mL/min (by C-G formula based on SCr of 0.97 mg/dL). Liver Function Tests: Recent Labs  Lab 01/03/17 0450  AST 23  ALT 13*  ALKPHOS 47  BILITOT 0.6  PROT 5.0*  ALBUMIN 2.0*   No results for input(s): LIPASE, AMYLASE in the last 168 hours. Recent Labs  Lab 12/30/16 1202 01/02/17 1128  AMMONIA 34 17   Coagulation Profile: No results for input(s): INR, PROTIME in the last 168 hours. Cardiac Enzymes: No results for input(s): CKTOTAL, CKMB, CKMBINDEX, TROPONINI in the last 168 hours. BNP (last 3 results) No results for input(s): PROBNP in the last 8760 hours. HbA1C: No results for input(s): HGBA1C in the last 72 hours. CBG: Recent Labs  Lab 12/29/16 1543  GLUCAP 95   Lipid Profile: No results for input(s): CHOL, HDL, LDLCALC, TRIG, CHOLHDL, LDLDIRECT in the last 72 hours. Thyroid Function Tests: Recent Labs    01/02/17 1648  TSH 0.900   Anemia Panel: No results for input(s): VITAMINB12, FOLATE, FERRITIN, TIBC, IRON, RETICCTPCT in the last 72 hours. Sepsis Labs: Recent Labs  Lab 12/29/16 0408  PROCALCITON <0.10    Recent Results (from the past 240 hour(s))  MRSA PCR Screening     Status: None   Collection Time: 12/26/16  5:52 PM  Result Value Ref Range Status   MRSA by PCR NEGATIVE NEGATIVE Final    Comment:        The GeneXpert MRSA Assay (FDA approved for NASAL specimens only), is one component of a comprehensive MRSA colonization surveillance program. It is not intended to diagnose MRSA infection nor to guide or monitor treatment  for MRSA infections.   Aerobic Culture (superficial specimen)     Status: None   Collection Time: 12/26/16  8:16 PM  Result Value Ref Range Status   Specimen Description WOUND LEFT THIGH  Final  Special Requests NONE  Final   Gram Stain NO WBC SEEN RARE GRAM POSITIVE COCCI IN PAIRS   Final   Culture NORMAL SKIN FLORA  Final   Report Status 12/29/2016 FINAL  Final  Culture, blood (Routine X 2) w Reflex to ID Panel     Status: None   Collection Time: 12/27/16  9:40 AM  Result Value Ref Range Status   Specimen Description BLOOD LEFT HAND  Final   Special Requests IN PEDIATRIC BOTTLE Blood Culture adequate volume  Final   Culture NO GROWTH 5 DAYS  Final   Report Status 01/01/2017 FINAL  Final  Culture, blood (Routine X 2) w Reflex to ID Panel     Status: None   Collection Time: 12/27/16  9:45 AM  Result Value Ref Range Status   Specimen Description BLOOD RIGHT ANTECUBITAL  Final   Special Requests IN PEDIATRIC BOTTLE Blood Culture adequate volume  Final   Culture NO GROWTH 5 DAYS  Final   Report Status 01/01/2017 FINAL  Final  Urine Culture     Status: None   Collection Time: 12/27/16 12:43 PM  Result Value Ref Range Status   Specimen Description URINE, CATHETERIZED  Final   Special Requests NONE  Final   Culture NO GROWTH  Final   Report Status 12/28/2016 FINAL  Final  Culture, blood (Routine X 2) w Reflex to ID Panel     Status: None (Preliminary result)   Collection Time: 12/31/16  9:28 AM  Result Value Ref Range Status   Specimen Description BLOOD RIGHT HAND  Final   Special Requests   Final    BOTTLES DRAWN AEROBIC AND ANAEROBIC Blood Culture adequate volume   Culture NO GROWTH 3 DAYS  Final   Report Status PENDING  Incomplete  Culture, Urine     Status: None   Collection Time: 12/31/16  9:34 AM  Result Value Ref Range Status   Specimen Description URINE, RANDOM  Final   Special Requests NONE  Final   Culture NO GROWTH  Final   Report Status 01/01/2017 FINAL  Final    Culture, blood (Routine X 2) w Reflex to ID Panel     Status: None (Preliminary result)   Collection Time: 12/31/16  9:35 AM  Result Value Ref Range Status   Specimen Description BLOOD LEFT HAND  Final   Special Requests   Final    BOTTLES DRAWN AEROBIC AND ANAEROBIC Blood Culture results may not be optimal due to an inadequate volume of blood received in culture bottles   Culture NO GROWTH 3 DAYS  Final   Report Status PENDING  Incomplete         Radiology Studies: Dg Chest Port 1 View  Result Date: 01/02/2017 CLINICAL DATA:  Shortness of breath. EXAM: PORTABLE CHEST 1 VIEW COMPARISON:  Radiograph of December 31, 2016. FINDINGS: Stable cardiomegaly. No pneumothorax or pleural effusion is noted. Stable bibasilar subsegmental atelectasis or infiltrates are noted. Bony thorax is unremarkable. IMPRESSION: Stable bibasilar subsegmental atelectasis or infiltrates. Electronically Signed   By: Marijo Conception, M.D.   On: 01/02/2017 10:35   Dg Bone Survey Met  Result Date: 01/03/2017 CLINICAL DATA:  Lytic lesion of the left humerus. EXAM: METASTATIC BONE SURVEY COMPARISON:  Radiographs dated 01/02/2017 and 12/30/2016 FINDINGS: Again noted is the lytic lesion in lateral aspect of the proximal left humeral shaft, measuring 3.6 x 1.0 cm on this exam. There is a 12 x 3 mm lucency in the distal fibular shaft.  There is a subtle 9 mm lytic area in one of the temporal bones. No other lytic lesions are noted in the skeleton. 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 There is multilevel degenerative disc and joint disease in the cervical spine. Aortic atherosclerosis. IMPRESSION: Lytic lesions in the left humerus, left fibula, and skull. The possibility of myeloma should be considered. Electronically Signed   By: Lorriane Shire M.D.   On: 01/03/2017 12:02   Dg Humerus Left  Result Date: 01/02/2017 CLINICAL DATA:  Lytic bone lesion on radiography EXAM: LEFT HUMERUS  - 2+ VIEW COMPARISON:  None FINDINGS: Proximal to the left deltoid insertion site along the proximal humeral diaphysis there is a 3.4 by 0.8 cm cortical lucent lesion. No overlying periosteal reaction. Chronic irregular appearing spurring along the common extensor tendon and common flexor tendon sites along the elbow. IMPRESSION: 1. Cortical proximal diaphyseal humeral lucency with fairly sharp zone of transition just proximal to the deltoid insertion site. Possibilities might include myeloma, metastatic disease, lymphoma, an old nonossifying fibroma. Correlate with any serologic indicators of myeloma and consider further skeletal workup or humerus MRI. Electronically Signed   By: Van Clines M.D.   On: 01/02/2017 12:42        Scheduled Meds: . budesonide (PULMICORT) nebulizer solution  0.5 mg Nebulization BID  . chlordiazePOXIDE  25 mg Oral TID   Followed by  . [START ON 01/05/2017] chlordiazePOXIDE  25 mg Oral BH-qamhs   Followed by  . [START ON 01/06/2017] chlordiazePOXIDE  25 mg Oral Daily  . diltiazem  120 mg Oral q morning - 10a  . ferrous sulfate  325 mg Oral Daily  . fluticasone  2 spray Each Nare Daily  . furosemide  40 mg Intravenous BID  . ipratropium-albuterol  3 mL Nebulization TID  . mouth rinse  15 mL Mouth Rinse BID  . multivitamin with minerals  1 tablet Oral Daily  . nicotine  21 mg Transdermal Daily  . pantoprazole (PROTONIX) IV  40 mg Intravenous Q24H  . potassium chloride  40 mEq Oral Q4H  . tamsulosin  0.4 mg Oral QPC supper  . thiamine  100 mg Oral Daily   Or  . thiamine  100 mg Intravenous Daily   Continuous Infusions: . sodium chloride Stopped (12/29/16 1100)  . piperacillin-tazobactam (ZOSYN)  IV 3.375 g (01/04/17 0921)     LOS: 9 days    Time spent: 35 minutes    Irine Seal, MD [facility] Pager [phone]  If 7PM-7AM, please contact night-coverage [email or web address] Password [facility] 01/04/2017, 10:24 AM

## 2017-01-05 ENCOUNTER — Inpatient Hospital Stay (HOSPITAL_COMMUNITY): Payer: Medicare Other

## 2017-01-05 LAB — CULTURE, BLOOD (ROUTINE X 2)
Culture: NO GROWTH
Culture: NO GROWTH
SPECIAL REQUESTS: ADEQUATE

## 2017-01-05 LAB — CBC
HCT: 27.9 % — ABNORMAL LOW (ref 39.0–52.0)
Hemoglobin: 8.4 g/dL — ABNORMAL LOW (ref 13.0–17.0)
MCH: 28.6 pg (ref 26.0–34.0)
MCHC: 30.1 g/dL (ref 30.0–36.0)
MCV: 94.9 fL (ref 78.0–100.0)
PLATELETS: 131 10*3/uL — AB (ref 150–400)
RBC: 2.94 MIL/uL — AB (ref 4.22–5.81)
RDW: 17.4 % — ABNORMAL HIGH (ref 11.5–15.5)
WBC: 6.1 10*3/uL (ref 4.0–10.5)

## 2017-01-05 LAB — BASIC METABOLIC PANEL
Anion gap: 7 (ref 5–15)
BUN: 11 mg/dL (ref 6–20)
CALCIUM: 8.4 mg/dL — AB (ref 8.9–10.3)
CO2: 38 mmol/L — ABNORMAL HIGH (ref 22–32)
CREATININE: 0.81 mg/dL (ref 0.61–1.24)
Chloride: 92 mmol/L — ABNORMAL LOW (ref 101–111)
GFR calc non Af Amer: >60 mL/min (ref >60)
Glucose, Bld: 128 mg/dL — ABNORMAL HIGH (ref 65–99)
Potassium: 3.5 mmol/L (ref 3.5–5.1)
SODIUM: 137 mmol/L (ref 135–145)

## 2017-01-05 LAB — AMMONIA: AMMONIA: 20 umol/L (ref 9–35)

## 2017-01-05 MED ORDER — CHLORDIAZEPOXIDE HCL 5 MG PO CAPS
15.0000 mg | ORAL_CAPSULE | ORAL | Status: AC
  Administered 2017-01-05 (×2): 15 mg via ORAL
  Filled 2017-01-05 (×2): qty 3

## 2017-01-05 MED ORDER — FLUTICASONE PROPIONATE 50 MCG/ACT NA SUSP
2.0000 | Freq: Every day | NASAL | Status: DC
  Administered 2017-01-05 – 2017-01-17 (×11): 2 via NASAL
  Filled 2017-01-05: qty 16

## 2017-01-05 MED ORDER — CHLORDIAZEPOXIDE HCL 5 MG PO CAPS
15.0000 mg | ORAL_CAPSULE | Freq: Every day | ORAL | Status: AC
  Administered 2017-01-06: 15 mg via ORAL
  Filled 2017-01-05: qty 3

## 2017-01-05 MED ORDER — CHLORDIAZEPOXIDE HCL 5 MG PO CAPS: 15.0000 mg | ORAL_CAPSULE | Freq: Four times a day (QID) | ORAL | Status: AC | PRN

## 2017-01-05 MED ORDER — POTASSIUM CHLORIDE CRYS ER 20 MEQ PO TBCR
40.0000 meq | EXTENDED_RELEASE_TABLET | Freq: Every day | ORAL | Status: DC
  Administered 2017-01-05 – 2017-01-17 (×13): 40 meq via ORAL
  Filled 2017-01-05 (×13): qty 2

## 2017-01-05 MED ORDER — IPRATROPIUM-ALBUTEROL 0.5-2.5 (3) MG/3ML IN SOLN: 3.0000 mL | RESPIRATORY_TRACT | Status: DC | PRN

## 2017-01-05 MED ORDER — SODIUM CHLORIDE 3 % IN NEBU
4.0000 mL | INHALATION_SOLUTION | Freq: Every day | RESPIRATORY_TRACT | Status: DC
  Administered 2017-01-05 – 2017-01-06 (×2): 4 mL via RESPIRATORY_TRACT
  Filled 2017-01-05 (×2): qty 4

## 2017-01-05 MED ORDER — DM-GUAIFENESIN ER 30-600 MG PO TB12
2.0000 | ORAL_TABLET | Freq: Two times a day (BID) | ORAL | Status: DC
  Administered 2017-01-05 – 2017-01-17 (×19): 2 via ORAL
  Filled 2017-01-05 (×2): qty 2
  Filled 2017-01-05: qty 1
  Filled 2017-01-05: qty 2
  Filled 2017-01-05: qty 1
  Filled 2017-01-05: qty 2
  Filled 2017-01-05: qty 1
  Filled 2017-01-05 (×9): qty 2
  Filled 2017-01-05: qty 1
  Filled 2017-01-05 (×3): qty 2

## 2017-01-05 MED ORDER — LORATADINE 10 MG PO TABS
10.0000 mg | ORAL_TABLET | Freq: Every day | ORAL | Status: DC
  Administered 2017-01-05 – 2017-01-17 (×12): 10 mg via ORAL
  Filled 2017-01-05 (×12): qty 1

## 2017-01-05 NOTE — Progress Notes (Signed)
PROGRESS NOTE    [name] [name]  ZOX:[id number] DOB: 1940-12-30 DOA: 12/26/2016 PCP: Ranae Palms, MD    Brief Narrative:  76 y.o. W M with a hx of significant ETOH abuse; per the family ~ 4 drinks of [name] per day and occasionally several bottles a day, who presented to [facility] with weakness, exertional dyspnea And melanotic stools for several days.  Pt was found to have gastric ulcer (non H pylori) and recommendations for indefinite PPI were given by GI. Hospital course complicated by fever of unknown origin, atrial fibrillation with rvr, and acute on chronic diastolic HF.       Assessment & Plan:   Principal Problem:   Acute respiratory failure with hypoxia (HCC) Active Problems:   GI bleed requiring more than 4 units of blood in 24 hours, ICU, or surgery   Acute diastolic CHF (congestive heart failure) (HCC)   Melena   Antral ulcer, chronic   Acute blood loss anemia   Alcoholic cirrhosis of liver without ascites (HCC)   SOB (shortness of breath)   Fever   A-fib (HCC)   Gastric ulcer: Per EGD 12/27/2016   Lytic lesion of bone on x-ray: Left humeral   Acute encephalopathy   UGI bleed  #1 acute respiratory failure with hypoxia Questionable etiology.  Initially felt patient was likely in volume overload per chest x-ray findings and elevated BNP and patient also noted to be in A. fib with RVR.  Improved clinically.  Patient currently off the BiPAP with sats of 96% on 4 L nasal cannula.  ABG with a normal pH with a elevated PCO2 and normal O2.  On nasal cannula.  Continue IV Lasix through today and transition to oral Lasix when patient is more alert hopefully in the next 24 hours.  Check O2 on room air and on ambulation when patient is more alert.  Follow.  2.  Probable acute on chronic diastolic CHF exacerbation Questionable etiology.  Felt likely triggered by A. fib with RVR versus volume overload from packed red blood cell transfusions.  Chest x-ray was noted to  have cardiomegaly and mild pulmonary vascular congestion 12/30/2016.  NP noted on 12/30/2016 to be elevated at 262.4.  Patient off BiPAP.  Patient on IV Lasix with a urine output of 1.150 L over the past 24 hours.  Current weight is 179 pounds from 181 pounds from 187 pounds from 201 pounds (01/02/2017) from a weight of 214 pounds on 12/29/2016.  [name] on 08/12/2016 at cardiologist office was 207 pounds.  Continue IV Lasix and likely transition back to home dose oral Lasix when patient is more alert, fully the next 24 hours.  3.  Chronic atrial fibrillation Patient noted to be in A. fib with RVR and had to be placed on the Cardizem drip.  Heart rate improved currently in the 90s.  Patient off BiPAP.  Discontinued Cardizem drip and resumed home regimen of Cardizem 120 mg daily.  Anticoagulation on hold due to recent GI bleed.  Follow.  4.  Acute upper GI bleed secondary to gastric ulcer Patient with no further tarry stools noted. Patient status post upper endoscopy which showed gastric ulcer which was negative for H. pylori and benign per biopsy.  Upper endoscopy showed no signs of portal gastropathy or varices.  Pathology suggested ulcerative gastritis.  Status post 1 unit packed red blood cells.  Hemoglobin stable at 8.4. Continue PPI.  Outpatient follow-up with GI.  5.  Acute blood loss anemia Secondary to problem #4.  Status post transfusion 1 unit packed red blood cells.  Continue PPI.  Hemoglobin currently at 8.4 from 7.6 from 7.6 from 8.3.  Follow H&H.  6.  Acute encephalopathy Patient drowsy today and opens eyes only to noxious stimuli.  Patient noted to have gotten some Librium 25 mg overnight after Librium detox protocol.  We will repeat ammonia levels.  TSH within normal limits.  Thiamine levels pending.  RPR nonreactive.  HIV reactive.  Patient currently off BiPAP.  ABG noted and normal pH with a PCO2 in the 60s.  Check a CT head.  Follow.  7.   Hypokalemia Secondary to diuresis.  Magnesium level at 2.2.  Repleted.  8.  Fever Questionable etiology.  Currently afebrile.  Urinalysis unremarkable.  Repeat chest x-ray has prior chest x-ray worrisome for volume overload.  Cultures from 12/27/2016 are negative.  Blood cultures from 12/31/2016 with no growth to date.  Urine cultures were negative.  MRSA PCR negative.  Fever curve trending down.  Patient status post 1 week of IV antibiotics of vancomycin and Zosyn.  No need for any further antibiotics at this time.   9.  Lytic lesions in left humerus, left fibula and skull /??  Myeloma  Dedicated humeral films with lytic lesion.  Skeletal survey with lytic lesions in the left humerus, left fibular and skull.  Concern for myeloma.  SPEP UPEP pending.  Will likely need outpatient follow-up with hematology/oncology.   10.  Alcoholic cirrhosis Ammonia levels within normal limits. Per upper endoscopy no signs of portal gastropathy or varices.  Outpatient follow-up with GI.  11.  Alcohol dependence Per family patient has been abstinent for close to 1-2 months.  Patient noted to be agitated however slowly improving.  Patient drowsy..  Continue thiamine, folate. Alcohol cessation.  Ativan withdrawal protocol was discontinued and patient now on Librium detox protocol.  We will decrease Librium dose to 15 mg as needed and scheduled due to patient's increased drowsiness.    DVT prophylaxis: SCDs Code Status: DNR Family Communication: No family at bedside.  Disposition Plan: Likely home versus SNF, once medically stable and patient improved and at baseline.   Consultants:   Gastroenterology: Dr. Carlean Purl 12/27/2016  Procedures:   Chest x-ray 12/27/2016, 12/28/2016, 12/30/2016, 12/31/2016  2D echo 12/31/2016  Upper endoscopy 12/27/2016 per Dr. Carlean Purl, nonbleeding gastric ulcer noted  Transfused 1 unit packed red blood cells 12/29/2016  Right lower extremity Dopplers 01/03/2017--- negative  for DVT, negative for Baker's cyst  Skeletal survey 01/03/2017  Antimicrobials:   IV Zosyn 12/27/2016>>>> 12/28/2016  IV Zosyn 12/31/2016>>>>>> 01/04/2017  IV vancomycin 12/27/2016>>>>> 12/28/2016   Subjective: Patient sleeping deeply.  Opens eyes only to noxious stimuli.  Patient noted to receive 25 mg of Librium last night from the Librium detox protocol.   Objective: Vitals:   01/04/17 1944 01/05/17 0625 01/05/17 0812 01/05/17 0816  BP:  (!) 116/58    Pulse:  80    Resp:  20    Temp:  98.4 F (36.9 C)    TempSrc:  Oral    SpO2: 97% 97% 96% 98%  Weight:  81.4 kg (179 lb 7.3 oz)    Height:        Intake/Output Summary (Last 24 hours) at 01/05/2017 0937 Last data filed at 01/05/2017 0156 Gross per 24 hour  Intake 400 ml  Output 1150 ml  Net -750 ml   Filed Weights   01/03/17 1356 01/04/17 0542 01/05/17  8719  Weight: 84 kg (185 lb 3 oz) 82.5 kg (181 lb 14.1 oz) 81.4 kg (179 lb 7.3 oz)    Examination:  General exam: Drowsy Respiratory system: Decreased bibasilar crackles noted.  Minimal expiratory wheezing.  No rhonchi. Cardiovascular system: Irregularly irregular.No murmurs, rubs, gallops or clicks. No pedal edema. Gastrointestinal system: Abdomen is soft, nontender, nondistended, positive bowel sounds.  No hepatosplenomegaly.  Central nervous system: Drowsy this morning.  Answering some questions appropriately.  Moving extremities spontaneously.  Extremities: Symmetric 5 x 5 power. Skin: No rashes, lesions or ulcers Psychiatry: Judgement and insight appear poor-fair. Mood & affect appropriate.     Data Reviewed: I have personally reviewed following labs and imaging studies  CBC: Recent Labs  Lab 12/30/16 2345 01/02/17 0032 01/03/17 0450 01/04/17 0538 01/05/17 0624  WBC 11.9* 7.2 6.4 5.2 6.1  HGB 8.7* 8.3* 7.6* 7.6* 8.4*  HCT 28.0* 27.9* 24.6* 24.6* 27.9*  MCV 93.3 96.9 93.2 92.8 94.9  PLT 178 144* 136* 134* 597*   Basic Metabolic Panel: Recent  Labs  Lab 12/31/16 1822  01/02/17 0032 01/02/17 0915 01/03/17 0450 01/04/17 0538 01/05/17 0624  NA 137   < > 140 142 135 135 137  K 3.7   < > 3.4* 3.0* 3.0* 3.0* 3.5  CL 97*   < > 93* 95* 90* 89* 92*  CO2 32   < > 41* 39* 37* 39* 38*  GLUCOSE 106*   < > 121* 105* 116* 126* 128*  BUN 11   < > _0 CREATININE 1.01   < > 1.11 1.05 1.03 0.97 0.81  CALCIUM 8.2*   < > 8.2* 8.3* 8.0* 7.9* 8.4*  MG 1.7  --   --  1.7 1.8 2.2  --    < > = values in this interval not displayed.   GFR: Estimated Creatinine Clearance: 79.2 mL/min (by C-G formula based on SCr of 0.81 mg/dL). Liver Function Tests: Recent Labs  Lab 01/03/17 0450  AST 23  ALT 13*  ALKPHOS 47  BILITOT 0.6  PROT 5.0*  ALBUMIN 2.0*   No results for input(s): LIPASE, AMYLASE in the last 168 hours. Recent Labs  Lab 12/30/16 1202 01/02/17 1128  AMMONIA 34 17   Coagulation Profile: No results for input(s): INR, PROTIME in the last 168 hours. Cardiac Enzymes: No results for input(s): CKTOTAL, CKMB, CKMBINDEX, TROPONINI in the last 168 hours. BNP (last 3 results) No results for input(s): PROBNP in the last 8760 hours. HbA1C: No results for input(s): HGBA1C in the last 72 hours. CBG: Recent Labs  Lab 12/29/16 1543  GLUCAP 95   Lipid Profile: No results for input(s): CHOL, HDL, LDLCALC, TRIG, CHOLHDL, LDLDIRECT in the last 72 hours. Thyroid Function Tests: Recent Labs    01/02/17 1648  TSH 0.900   Anemia Panel: No results for input(s): VITAMINB12, FOLATE, FERRITIN, TIBC, IRON, RETICCTPCT in the last 72 hours. Sepsis Labs: No results for input(s): PROCALCITON, LATICACIDVEN in the last 168 hours.  Recent Results (from the past 240 hour(s))  MRSA PCR Screening     Status: None   Collection Time: 12/26/16  5:52 PM  Result Value Ref Range Status   MRSA by PCR NEGATIVE NEGATIVE Final    Comment:        The GeneXpert MRSA Assay (FDA approved for NASAL specimens only), is one component of  a comprehensive MRSA colonization surveillance program. It is not intended to diagnose MRSA infection nor to guide or monitor  treatment for MRSA infections.   Aerobic Culture (superficial specimen)     Status: None   Collection Time: 12/26/16  8:16 PM  Result Value Ref Range Status   Specimen Description WOUND LEFT THIGH  Final   Special Requests NONE  Final   Gram Stain NO WBC SEEN RARE GRAM POSITIVE COCCI IN PAIRS   Final   Culture NORMAL SKIN FLORA  Final   Report Status 12/29/2016 FINAL  Final  Culture, blood (Routine X 2) w Reflex to ID Panel     Status: None   Collection Time: 12/27/16  9:40 AM  Result Value Ref Range Status   Specimen Description BLOOD LEFT HAND  Final   Special Requests IN PEDIATRIC BOTTLE Blood Culture adequate volume  Final   Culture NO GROWTH 5 DAYS  Final   Report Status 01/01/2017 FINAL  Final  Culture, blood (Routine X 2) w Reflex to ID Panel     Status: None   Collection Time: 12/27/16  9:45 AM  Result Value Ref Range Status   Specimen Description BLOOD RIGHT ANTECUBITAL  Final   Special Requests IN PEDIATRIC BOTTLE Blood Culture adequate volume  Final   Culture NO GROWTH 5 DAYS  Final   Report Status 01/01/2017 FINAL  Final  Urine Culture     Status: None   Collection Time: 12/27/16 12:43 PM  Result Value Ref Range Status   Specimen Description URINE, CATHETERIZED  Final   Special Requests NONE  Final   Culture NO GROWTH  Final   Report Status 12/28/2016 FINAL  Final  Culture, blood (Routine X 2) w Reflex to ID Panel     Status: None (Preliminary result)   Collection Time: 12/31/16  9:28 AM  Result Value Ref Range Status   Specimen Description BLOOD RIGHT HAND  Final   Special Requests   Final    BOTTLES DRAWN AEROBIC AND ANAEROBIC Blood Culture adequate volume   Culture NO GROWTH 4 DAYS  Final   Report Status PENDING  Incomplete  Culture, Urine     Status: None   Collection Time: 12/31/16  9:34 AM  Result Value Ref Range Status    Specimen Description URINE, RANDOM  Final   Special Requests NONE  Final   Culture NO GROWTH  Final   Report Status 01/01/2017 FINAL  Final  Culture, blood (Routine X 2) w Reflex to ID Panel     Status: None (Preliminary result)   Collection Time: 12/31/16  9:35 AM  Result Value Ref Range Status   Specimen Description BLOOD LEFT HAND  Final   Special Requests   Final    BOTTLES DRAWN AEROBIC AND ANAEROBIC Blood Culture results may not be optimal due to an inadequate volume of blood received in culture bottles   Culture NO GROWTH 4 DAYS  Final   Report Status PENDING  Incomplete         Radiology Studies: Dg Bone Survey Met  Result Date: 01/03/2017 CLINICAL DATA:  Lytic lesion of the left humerus. EXAM: METASTATIC BONE SURVEY COMPARISON:  Radiographs dated 01/02/2017 and 12/30/2016 FINDINGS: Again noted is the lytic lesion in lateral aspect of the proximal left humeral shaft, measuring 3.6 x 1.0 cm on this exam. There is a 12 x 3 mm lucency in the distal fibular shaft. There is a subtle 9 mm lytic area in one of the temporal bones. No other lytic lesions are noted in the skeleton. 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 There is multilevel degenerative disc and joint disease in the cervical spine. Aortic atherosclerosis. IMPRESSION: Lytic lesions in the left humerus, left fibula, and skull. The possibility of myeloma should be considered. Electronically Signed   By: Lorriane Shire M.D.   On: 01/03/2017 12:02        Scheduled Meds: . budesonide (PULMICORT) nebulizer solution  0.5 mg Nebulization BID  . chlordiazePOXIDE  25 mg Oral BH-qamhs   Followed by  . [START ON 01/06/2017] chlordiazePOXIDE  25 mg Oral Daily  . diltiazem  120 mg Oral q morning - 10a  . ferrous sulfate  325 mg Oral Daily  . fluticasone  2 spray Each Nare Daily  . furosemide  40 mg Intravenous BID  . ipratropium-albuterol  3 mL Nebulization TID  . mouth rinse  15 mL  Mouth Rinse BID  . multivitamin with minerals  1 tablet Oral Daily  . nicotine  21 mg Transdermal Daily  . pantoprazole (PROTONIX) IV  40 mg Intravenous Q24H  . potassium chloride  40 mEq Oral Daily  . tamsulosin  0.4 mg Oral QPC supper  . thiamine  100 mg Oral Daily   Continuous Infusions: . sodium chloride Stopped (12/29/16 1100)     LOS: 10 days    Time spent: 35 minutes    Irine Seal, MD [facility] Pager [phone]  If 7PM-7AM, please contact night-coverage [email or web address] Password [facility] 01/05/2017, 9:37 AM

## 2017-01-05 NOTE — Progress Notes (Signed)
Patient complained of shortness of breath.  02 sats 87-89%; coarse crackles chest sounds heard.   Paged Dr. Janee Mornhompson.   Dr. returned call and came to examined pt.  Placed order for chest PT, CXR and PRN nebs.

## 2017-01-05 NOTE — Evaluation (Signed)
Occupational Therapy Evaluation Patient Details Name: [name] [name] MRN: [id number] DOB: 1940-10-05 Today's Date: 01/05/2017    History of Present Illness Pt is a 76 y.o. who [facility] with weakness, exertional dyspnea, and melanotic stools and was subsequently [facility]. Pt with multiple hospitalizations in the month prior to presentation. He has a history of ETOH abuse. Pt found to have gastric ulcer and hospital course complicated by fever of unknown origin, atrial fibrillation with RVR, and acute on chronic diastolic heart failure. Pt also with lytic lesions in L humeruse, L fibula, and skull.    Clinical Impression   Pt and family report that pt was independent and working up until initial hospitalization about a month ago. He currently presents to OT evaluation with shortness of breath with minimal activity, B UE weakness (R>L), poor seated balance with R lateral lean, and decreased activity tolerance for ADL. He additionally demonstrates some difficulty with problem solving. Pt requires max-total assist for dressing and bathing tasks and unable to achieve upright standing position despite max assist in preparation for ADL transfers. Pt demonstrates significantly decreased functional independence and recommend SNF level rehabilitation post-acute D/C in order to maximize return to PLOF. Of note, pt with significant dyspnea and unable to obtain accurate SpO2 reading with finger probe. Notified RN who reports she will assess pt at bedside.     Follow Up Recommendations  SNF;Supervision/Assistance - 24 hour    Equipment Recommendations  Other (comment)(TBD at next venue of care)    Recommendations for Other Services       Precautions / Restrictions Restrictions Weight Bearing Restrictions: No      Mobility Bed Mobility Overal bed mobility: Needs Assistance Bed Mobility: Rolling;Sidelying to Sit;Sit to Sidelying Rolling: Max assist Sidelying  to sit: Max assist     Sit to sidelying: Max assist General bed mobility comments: Assist to roll to left side and scoot hips to EOB.   Transfers Overall transfer level: Needs assistance               General transfer comment: Attempted sit<> stand transfer in preparation for ADL transfers and pt able to power up approximately 75% of full standing with max assist but unable to fully support self. He fatigued easily and on second attempt for repositioning in bed, unable to clear hips.     Balance Overall balance assessment: Needs assistance Sitting-balance support: No upper extremity supported;Feet supported Sitting balance-Leahy Scale: Poor Sitting balance - Comments: Initially able to maintain midline and balance at EOB with supervision but fatiguing and increasing R lateral lean requiring up to mod assist after a few minutes.                                    ADL either performed or assessed with clinical judgement   ADL Overall ADL's : Needs assistance/impaired Eating/Feeding: Sitting;Moderate assistance Eating/Feeding Details (indicate cue type and reason): Difficulty maintaining R UE contraction to sustain hand to mouth movement. Grooming: Moderate assistance;Sitting   Upper Body Bathing: Maximal assistance;Sitting   Lower Body Bathing: Total assistance   Upper Body Dressing : Maximal assistance;Sitting   Lower Body Dressing: Total assistance                 General ADL Comments: Attempted to stand with max assist from therapist and able to clear hips and become approcimately 75% upright with max assist.  Pt with poor ability to maintain midline positioning sitting at EOB with significant R lateral lean and forward lean.      Vision Baseline Vision/History: Wears glasses Wears Glasses: At all times Patient Visual Report: No change from baseline Vision Assessment?: No apparent visual deficits Additional Comments: Will continue to assess.  Assisted pt to don glasses and pt able to track and make eye contact well identifying all objects in visual field.      Perception     Praxis      Pertinent Vitals/Pain Pain Assessment: No/denies pain     Hand Dominance     Extremity/Trunk Assessment Upper Extremity Assessment Upper Extremity Assessment: RUE deficits/detail;LUE deficits/detail RUE Deficits / Details: Decreased strength 2+/5 grossly. Limited AROM against gravity at shoulder to 0-75 degrees approximatley. Slow movement and lag with all coordination testing as compared with L. RUE Coordination: decreased fine motor;decreased gross motor LUE Deficits / Details: Per chart, L humeral lytic lesion. Generally weak and slow movement. 3/5 strength grossly.  LUE Coordination: decreased gross motor;decreased fine motor   Lower Extremity Assessment Lower Extremity Assessment: Defer to PT evaluation       Communication Communication Communication: Expressive difficulties(due to shortness of breath)   Cognition Arousal/Alertness: Awake/alert Behavior During Therapy: WFL for tasks assessed/performed Overall Cognitive Status: Impaired/Different from baseline Area of Impairment: Awareness;Problem solving;Following commands                       Following Commands: Follows one step commands with increased time;Follows multi-step commands with increased time   Awareness: Emergent Problem Solving: Slow processing;Requires verbal cues General Comments: Slow processing at times and requiring VC's to problem solve how to correct posture/balance, etc.    General Comments  Son present during session.     Exercises     Shoulder Instructions      Home Living Family/patient expects to be discharged to:: Private residence Living Arrangements: Spouse/significant other Available Help at Discharge: Family;Available 24 hours/day Type of Home: House Home Access: Stairs to enter [employer] of Steps: 1 small step  in front   Home Layout: Multi-level;Able to live on main level with bedroom/bathroom     Bathroom Shower/Tub: Producer, television/film/video: Standard     Home Equipment: Shower seat          Prior Functioning/Environment Level of Independence: Independent        Comments: Had been working prior to decline in health over the past few months per family. Worked with his son.         OT Problem List: Decreased strength;Decreased range of motion;Decreased activity tolerance;Impaired balance (sitting and/or standing);Decreased safety awareness;Decreased knowledge of use of DME or AE;Decreased knowledge of precautions;Decreased cognition;Decreased coordination;Cardiopulmonary status limiting activity;Impaired UE functional use      OT Treatment/Interventions: Self-care/ADL training;Therapeutic exercise;Energy conservation;DME and/or AE instruction;Therapeutic activities;Patient/family education;Balance training    OT Goals(Current goals can be found in the care plan section) Acute Rehab OT Goals Patient Stated Goal: to get rehab and get back to independent OT Goal Formulation: With patient Time For Goal Achievement: 01/19/17 Potential to Achieve Goals: Fair ADL Goals Pt Will Perform Grooming: with min guard assist;sitting Pt Will Perform Upper Body Dressing: with min assist;sitting Pt Will Perform Lower Body Dressing: with mod assist;sitting/lateral leans Pt Will Transfer to Toilet: with mod assist;bedside commode Pt Will Perform Toileting - Clothing Manipulation and hygiene: with min assist;sitting/lateral leans Pt/caregiver will Perform Home Exercise Program: Increased ROM;Increased strength;Right Upper  extremity;Both right and left upper extremity;Independently;With written HEP provided  OT Frequency: Min 2X/week   Barriers to D/C:            Co-evaluation              AM-PAC PT "6 Clicks" Daily Activity     Outcome Measure Help from another person eating  meals?: A Lot Help from another person taking care of personal grooming?: A Lot Help from another person toileting, which includes using toliet, bedpan, or urinal?: Total Help from another person bathing (including washing, rinsing, drying)?: A Lot Help from another person to put on and taking off regular upper body clothing?: A Lot Help from another person to put on and taking off regular lower body clothing?: Total 6 Click Score: 10   End of Session Equipment Utilized During Treatment: Gait belt;Rolling walker Nurse Communication: Mobility status(Unable to obtain accurate O2 reading. )  Activity Tolerance: Patient tolerated treatment well Patient left: in bed;with call bell/phone within reach  OT Visit Diagnosis: Other abnormalities of gait and mobility (R26.89);Muscle weakness (generalized) (M62.81)                Time: [id number] OT Time Calculation (min): 33 min Charges:  OT General Charges $OT Visit: 1 Visit OT Evaluation $OT Eval Moderate Complexity: 1 Mod OT Treatments $Self Care/Home Management : 8-22 mins G-Codes:     [name] Sectionharity A Yoshua [facility], MS OTR/L  Pager: [phone]   [name] [name] 01/05/2017, 5:30 PM

## 2017-01-05 NOTE — Progress Notes (Signed)
PT Cancellation Note  Patient Details Name: Cristal GenerousJerry F Jakubek MRN: [id number] DOB: 1940/07/18   Cancelled Treatment:    Reason Eval/Treat Not Completed: Patient's level of consciousness.  MD in to work on his meds and condition, will try later as time and pt allow.   Ivar DrapeRuth E Urian Martenson 01/05/2017, 9:46 AM   Samul Dadauth Avontae Burkhead, PT MS Acute Rehab Dept. Number: [facility] [phone] and [facility] [phone]

## 2017-01-06 LAB — BASIC METABOLIC PANEL
Anion gap: 10 (ref 5–15)
BUN: 10 mg/dL (ref 6–20)
CHLORIDE: 91 mmol/L — AB (ref 101–111)
CO2: 35 mmol/L — ABNORMAL HIGH (ref 22–32)
CREATININE: 0.76 mg/dL (ref 0.61–1.24)
Calcium: 8.2 mg/dL — ABNORMAL LOW (ref 8.9–10.3)
Glucose, Bld: 110 mg/dL — ABNORMAL HIGH (ref 65–99)
Potassium: 3.9 mmol/L (ref 3.5–5.1)
SODIUM: 136 mmol/L (ref 135–145)

## 2017-01-06 LAB — CBC
HCT: 26.5 % — ABNORMAL LOW (ref 39.0–52.0)
HEMOGLOBIN: 8.2 g/dL — AB (ref 13.0–17.0)
MCH: 28.6 pg (ref 26.0–34.0)
MCHC: 30.9 g/dL (ref 30.0–36.0)
MCV: 92.3 fL (ref 78.0–100.0)
PLATELETS: 117 10*3/uL — AB (ref 150–400)
RBC: 2.87 MIL/uL — AB (ref 4.22–5.81)
RDW: 17 % — ABNORMAL HIGH (ref 11.5–15.5)
WBC: 8.3 10*3/uL (ref 4.0–10.5)

## 2017-01-06 MED ORDER — FUROSEMIDE 40 MG PO TABS
40.0000 mg | ORAL_TABLET | Freq: Two times a day (BID) | ORAL | Status: DC
  Administered 2017-01-06 – 2017-01-10 (×8): 40 mg via ORAL
  Filled 2017-01-06 (×8): qty 1

## 2017-01-06 NOTE — Progress Notes (Signed)
PROGRESS NOTE    [name] [name]  ZOX:[id number] DOB: [birth year] DOA: 12/26/2016 PCP: Ardyth Man, MD    Brief Narrative:  76 y.o. W M with a hx of significant ETOH abuse; per the family ~ 4 drinks of [name] per day and occasionally several bottles a day, who presented to [facility] with weakness, exertional dyspnea And melanotic stools for several days.  Pt was found to have gastric ulcer (non H pylori) and recommendations for indefinite PPI were given by GI. Hospital course complicated by fever of unknown origin, atrial fibrillation with rvr, and acute on chronic diastolic HF.       Assessment & Plan:   Principal Problem:   Acute respiratory failure with hypoxia (HCC) Active Problems:   GI bleed requiring more than 4 units of blood in 24 hours, ICU, or surgery   Acute diastolic CHF (congestive heart failure) (HCC)   Melena   Antral ulcer, chronic   Acute blood loss anemia   Alcoholic cirrhosis of liver without ascites (HCC)   SOB (shortness of breath)   Fever   A-fib (HCC)   Gastric ulcer: Per EGD 12/27/2016   Lytic lesion of bone on x-ray: Left humeral   Acute encephalopathy   UGI bleed  #1 acute respiratory failure with hypoxia Questionable etiology.  Initially felt patient was likely in volume overload per chest x-ray findings and elevated BNP and patient also noted to be in A. fib with RVR.  Improved clinically.  Patient currently off the BiPAP with sats of 96% on 4 L nasal cannula.  ABG with a normal pH with a elevated PCO2 and normal O2.  On nasal cannula.  Patient noted yesterday with complaints of shortness of breath in the afternoon when patient reexamined patient was noted to have significant congestion.  Patient started on Mucinex DM, chest PT, Claritin, scheduled nebs, 3% saline nebulizer treatment with clinical improvement.  Change IV Lasix to oral Lasix.  Follow.    2.  Probable acute on chronic diastolic CHF exacerbation Questionable etiology.  Felt  likely triggered by A. fib with RVR versus volume overload from packed red blood cell transfusions.  Chest x-ray was noted to have cardiomegaly and mild pulmonary vascular congestion 12/30/2016.  NP noted on 12/30/2016 to be elevated at 262.4.  Patient off BiPAP.  Patient on IV Lasix with a urine output of 0.700 L over the past 24 hours.  Current weight is 177 pounds from 179 pounds from 181 pounds from 187 pounds from 201 pounds (01/02/2017) from a weight of 214 pounds on 12/29/2016.  [name] on 08/12/2016 at cardiologist office was 207 pounds.  Change IV Lasix back to home dose oral Lasix.  3.  Chronic atrial fibrillation Patient noted to be in A. fib with RVR and had to be placed on the Cardizem drip.  Heart rate improved currently in the 90s.  Patient off BiPAP.  Discontinued Cardizem drip and resumed home regimen of Cardizem 120 mg daily.  Anticoagulation on hold due to recent GI bleed.  Follow.  4.  Acute upper GI bleed secondary to gastric ulcer Patient with no further tarry stools noted. Patient status post upper endoscopy which showed gastric ulcer which was negative for H. pylori and benign per biopsy.  Upper endoscopy showed no signs of portal gastropathy or varices.  Pathology suggested ulcerative gastritis.  Status post 1 unit packed red blood cells.  Hemoglobin stable at 8.2. Continue PPI.  Outpatient follow-up with  GI.  5.  Acute blood loss anemia Secondary to problem #4.  Status post transfusion 1 unit packed red blood cells.  Continue PPI.  Hemoglobin currently at 8.2 from 8.4 from 7.6 from 7.6 from 8.3.  Follow H&H.  6.  Acute encephalopathy Patient more alert today after Librium dose was decreased yesterday.  Patient noted to have gotten some Librium 25 mg the night of 01/04/2017, after Librium detox protocol.  Ammonia level at 20.  TSH within normal limits.  Thiamine levels pending.  RPR nonreactive.  HIV reactive.  Patient currently off BiPAP.  ABG  noted and normal pH with a PCO2 in the 60s.  CT head negative for any acute abnormalities.  Follow.  7.  Hypokalemia Secondary to diuresis.  Magnesium level at 2.2.  Repleted.  8.  Fever Questionable etiology.  Currently afebrile.  Urinalysis unremarkable.  Repeat chest x-ray has prior chest x-ray worrisome for volume overload.  Cultures from 12/27/2016 are negative.  Blood cultures from 12/31/2016 with no growth to date.  Urine cultures were negative.  MRSA PCR negative.  Fever curve trended down.  Patient status post 1 week of IV antibiotics of vancomycin and Zosyn.  No need for any further antibiotics at this time.   9.  Lytic lesions in left humerus, left fibula and skull /??  Myeloma  Dedicated humeral films with lytic lesion.  Skeletal survey with lytic lesions in the left humerus, left fibular and skull.  Concern for myeloma.  SPEP UPEP pending.  Will likely need outpatient follow-up with hematology/oncology.   10.  Alcoholic cirrhosis Ammonia levels within normal limits. Per upper endoscopy no signs of portal gastropathy or varices.  Outpatient follow-up with GI.  11.  Alcohol dependence Per family patient has been abstinent for close to 1-2 months.  Patient noted to be agitated however slowly improving.  Patient more alert today after Librium dose decreased yesterday. Continue thiamine, folate. Alcohol cessation.  Ativan withdrawal protocol was discontinued and patient now on Librium detox protocol.  Decreased Librium dose to 15 mg as needed and scheduled due to patient's increased drowsiness on 01/05/2017.    DVT prophylaxis: SCDs Code Status: DNR Family Communication: No family at bedside.  Disposition Plan: Likely SNF, once medically stable and patient improved and at baseline.   Consultants:   Gastroenterology: Dr. Leone PayorGessner 12/27/2016  Procedures:   Chest x-ray 12/27/2016, 12/28/2016, 12/30/2016, 12/31/2016, 01/05/2017  2D echo 12/31/2016  Upper endoscopy 12/27/2016 per  Dr. Leone PayorGessner, nonbleeding gastric ulcer noted  Transfused 1 unit packed red blood cells 12/29/2016  Right lower extremity Dopplers 01/03/2017--- negative for DVT, negative for Baker's cyst  Skeletal survey 01/03/2017  CT head 01/05/2017  Antimicrobials:   IV Zosyn 12/27/2016>>>> 12/28/2016  IV Zosyn 12/31/2016>>>>>> 01/04/2017  IV vancomycin 12/27/2016>>>>> 12/28/2016   Subjective: Patient sitting up in bed.  More alert today.  Following commands.  Patient denies any chest pain.  Patient states shortness of breath has improved since yesterday afternoon.  Patient states he had a good afternoon yesterday.    Objective: Vitals:   01/05/17 2058 01/06/17 0620 01/06/17 0802 01/06/17 0803  BP:  (!) 125/55    Pulse: 77 72    Resp: 20 20    Temp:  97.9 F (36.6 C)    TempSrc:  Oral    SpO2: 94% 100% 96% 96%  Weight:  80.5 kg (177 lb 7.5 oz)    Height:        Intake/Output Summary (Last 24 hours) at 01/06/2017 1011  Last data filed at 01/06/2017 0005 Gross per 24 hour  Intake 240 ml  Output 700 ml  Net -460 ml   Filed Weights   01/04/17 0542 01/05/17 0625 01/06/17 0620  Weight: 82.5 kg (181 lb 14.1 oz) 81.4 kg (179 lb 7.3 oz) 80.5 kg (177 lb 7.5 oz)    Examination:  General exam: More alert Respiratory system: Some scattered congestion noted.  Minimal expiratory wheezing.  No crackles.  Cardiovascular system: Irregularly irregular.No murmurs, rubs, gallops or clicks. No pedal edema. Gastrointestinal system: Abdomen is soft, nontender, nondistended, positive bowel sounds.  No hepatosplenomegaly.  Central nervous system: More alert this morning. Answering some questions appropriately.  Moving extremities spontaneously.  Extremities: Symmetric 5 x 5 power. Skin: No rashes, lesions or ulcers Psychiatry: Judgement and insight appear poor-fair. Mood & affect appropriate.     Data Reviewed: I have personally reviewed following labs and imaging studies  CBC: Recent Labs    Lab 01/14/17 0032 01/03/17 0450 01/04/17 0538 01/05/17 0624 01/06/17 0630  WBC 7.2 6.4 5.2 6.1 8.3  HGB 8.3* 7.6* 7.6* 8.4* 8.2*  HCT 27.9* 24.6* 24.6* 27.9* 26.5*  MCV 96.9 93.2 92.8 94.9 92.3  PLT 144* 136* 134* 131* 117*   Basic Metabolic Panel: Recent Labs  Lab 12/31/16 1822  2017-01-14 0915 01/03/17 0450 01/04/17 0538 01/05/17 0624 01/06/17 0630  NA 137   < > 142 135 135 137 136  K 3.7   < > 3.0* 3.0* 3.0* 3.5 3.9  CL 97*   < > 95* 90* 89* 92* 91*  CO2 32   < > 39* 37* 39* 38* 35*  GLUCOSE 106*   < > 105* 116* 126* 128* 110*  BUN 11   < > 18 17 12 11 10   CREATININE 1.01   < > 1.05 1.03 0.97 0.81 0.76  CALCIUM 8.2*   < > 8.3* 8.0* 7.9* 8.4* 8.2*  MG 1.7  --  1.7 1.8 2.2  --   --    < > = values in this interval not displayed.   GFR: Estimated Creatinine Clearance: 79.9 mL/min (by C-G formula based on SCr of 0.76 mg/dL). Liver Function Tests: Recent Labs  Lab 01/03/17 0450  AST 23  ALT 13*  ALKPHOS 47  BILITOT 0.6  PROT 5.0*  ALBUMIN 2.0*   No results for input(s): LIPASE, AMYLASE in the last 168 hours. Recent Labs  Lab 12/30/16 1202 01/14/2017 1128 01/05/17 1000  AMMONIA 34 17 20   Coagulation Profile: No results for input(s): INR, PROTIME in the last 168 hours. Cardiac Enzymes: No results for input(s): CKTOTAL, CKMB, CKMBINDEX, TROPONINI in the last 168 hours. BNP (last 3 results) No results for input(s): PROBNP in the last 8760 hours. HbA1C: No results for input(s): HGBA1C in the last 72 hours. CBG: No results for input(s): GLUCAP in the last 168 hours. Lipid Profile: No results for input(s): CHOL, HDL, LDLCALC, TRIG, CHOLHDL, LDLDIRECT in the last 72 hours. Thyroid Function Tests: No results for input(s): TSH, T4TOTAL, FREET4, T3FREE, THYROIDAB in the last 72 hours. Anemia Panel: No results for input(s): VITAMINB12, FOLATE, FERRITIN, TIBC, IRON, RETICCTPCT in the last 72 hours. Sepsis Labs: No results for input(s): PROCALCITON, LATICACIDVEN  in the last 168 hours.  Recent Results (from the past 240 hour(s))  Urine Culture     Status: None   Collection Time: 12/27/16 12:43 PM  Result Value Ref Range Status   Specimen Description URINE, CATHETERIZED  Final   Special Requests NONE  Final   Culture NO GROWTH  Final   Report Status 12/28/2016 FINAL  Final  Culture, blood (Routine X 2) w Reflex to ID Panel     Status: None   Collection Time: 12/31/16  9:28 AM  Result Value Ref Range Status   Specimen Description BLOOD RIGHT HAND  Final   Special Requests   Final    BOTTLES DRAWN AEROBIC AND ANAEROBIC Blood Culture adequate volume   Culture NO GROWTH 5 DAYS  Final   Report Status 01/05/2017 FINAL  Final  Culture, Urine     Status: None   Collection Time: 12/31/16  9:34 AM  Result Value Ref Range Status   Specimen Description URINE, RANDOM  Final   Special Requests NONE  Final   Culture NO GROWTH  Final   Report Status 01/01/2017 FINAL  Final  Culture, blood (Routine X 2) w Reflex to ID Panel     Status: None   Collection Time: 12/31/16  9:35 AM  Result Value Ref Range Status   Specimen Description BLOOD LEFT HAND  Final   Special Requests   Final    BOTTLES DRAWN AEROBIC AND ANAEROBIC Blood Culture results may not be optimal due to an inadequate volume of blood received in culture bottles   Culture NO GROWTH 5 DAYS  Final   Report Status 01/05/2017 FINAL  Final         Radiology Studies: Ct Head Wo Contrast  Result Date: 01/05/2017 CLINICAL DATA:  Altered level of consciousness. EXAM: CT HEAD WITHOUT CONTRAST TECHNIQUE: Contiguous axial images were obtained from the base of the skull through the vertex without intravenous contrast. COMPARISON:  None. FINDINGS: Brain: There is atrophy and chronic small vessel disease changes. No acute intracranial abnormality. Specifically, no hemorrhage, hydrocephalus, mass lesion, acute infarction, or significant intracranial injury. Vascular: No hyperdense vessel or unexpected  calcification. Skull: No acute calvarial abnormality. Sinuses/Orbits: No acute finding. Other: None IMPRESSION: No acute intracranial abnormality. Atrophy, chronic microvascular disease. Electronically Signed   By: Charlett NoseKevin  Dover M.D.   On: 01/05/2017 11:35   Dg Chest Port 1 View  Result Date: 01/05/2017 CLINICAL DATA:  Shortness of breath and congestion. EXAM: PORTABLE CHEST 1 VIEW COMPARISON:  01/02/2017 FINDINGS: Stable top-normal heart size. Improved aeration at the lung bases with less prominent bibasilar atelectasis. There is no evidence of pulmonary edema, consolidation, pneumothorax, nodule or pleural fluid. IMPRESSION: Improved aeration with less prominent bibasilar atelectasis. Electronically Signed   By: Irish LackGlenn  Yamagata M.D.   On: 01/05/2017 14:59        Scheduled Meds: . budesonide (PULMICORT) nebulizer solution  0.5 mg Nebulization BID  . chlordiazePOXIDE  15 mg Oral Daily  . dextromethorphan-guaiFENesin  2 tablet Oral BID  . diltiazem  120 mg Oral q morning - 10a  . ferrous sulfate  325 mg Oral Daily  . fluticasone  2 spray Each Nare Daily  . furosemide  40 mg Oral BID  . ipratropium-albuterol  3 mL Nebulization TID  . loratadine  10 mg Oral Daily  . mouth rinse  15 mL Mouth Rinse BID  . multivitamin with minerals  1 tablet Oral Daily  . nicotine  21 mg Transdermal Daily  . pantoprazole (PROTONIX) IV  40 mg Intravenous Q24H  . potassium chloride  40 mEq Oral Daily  . sodium chloride HYPERTONIC  4 mL Nebulization Daily  . tamsulosin  0.4 mg Oral QPC supper  . thiamine  100 mg Oral Daily   Continuous Infusions: .  sodium chloride Stopped (12/29/16 1100)     LOS: 11 days    Time spent: 35 minutes    Ramiro Harvest, MD [facility] Pager [phone]  If 7PM-7AM, please contact night-coverage [email or web address] Password [email or web address] 01/06/2017, 10:11 AM

## 2017-01-06 NOTE — Evaluation (Signed)
Physical Therapy Evaluation Patient Details Name: [name] GenerousJerry F Glascoe MRN: [id number] DOB: 29-Feb-1940 Today's Date: 01/06/2017   History of Present Illness  Pt is a 76 y.o. who [facility] with weakness, exertional dyspnea, and melanotic stools and [facility]. Pt with multiple hospitalizations in the month prior to presentation. He has a history of ETOH abuse. Pt found to have gastric ulcer and hospital course complicated by fever of unknown origin, atrial fibrillation with RVR, and acute on chronic diastolic heart failure. Pt also with lytic lesions in L humeruse, L fibula, and skull.   Clinical Impression  Pt was more alert when PT reattempted at lunch time, and was able to be assisted bedside to sit with RUE supported on pillows and with midline correction of his posture.  Pt is willing to be assisted but was too unsteady to attempt a stand pivot transfer to the chair.  He is willing to work but will need a great deal more therapy to get home with wife.  His family is not present during eval to ask if others could assist her, but will ask for now to go to SNF for recovery of his strength and mobility.  Follow acutely for same as he will need to progress as much as possible for reduction in SNF stay.    Follow Up Recommendations SNF    Equipment Recommendations  None recommended by PT(TBD at SNF)    Recommendations for Other Services       Precautions / Restrictions Precautions Precautions: Fall Precaution Comments: R hemiparesis Restrictions Weight Bearing Restrictions: No      Mobility  Bed Mobility Overal bed mobility: Needs Assistance Bed Mobility: Supine to Sit;Sit to Supine Rolling: Mod assist Sidelying to sit: Max assist Supine to sit: Total assist   Sit to sidelying: Max assist General bed mobility comments: unable to assist with RUE and supported with pillows once sitting  Transfers Overall transfer level: Needs  assistance               General transfer comment: pt is too unfocused to attempt pivot transfer to chair, weak after sitting practice and leaning all directions so too unsafe OOB  Ambulation/Gait             General Gait Details: non ambulatory  Stairs            Wheelchair Mobility    Modified Rankin (Stroke Patients Only) Modified Rankin (Stroke Patients Only) Pre-Morbid Rankin Score: No symptoms Modified Rankin: Severe disability     Balance Overall balance assessment: Needs assistance Sitting-balance support: Feet supported;Bilateral upper extremity supported Sitting balance-Leahy Scale: Poor                                       Pertinent Vitals/Pain Pain Assessment: Faces Faces Pain Scale: Hurts little more Pain Location: joints and skin from being in bed Pain Intervention(s): Limited activity within patient's tolerance;Monitored during session;Repositioned    Home Living Family/patient expects to be discharged to:: Private residence Living Arrangements: Spouse/significant other Available Help at Discharge: Family;Available 24 hours/day Type of Home: House Home Access: Stairs to enter   [employer] CorporationEntrance Stairs-Number of Steps: 1 small step in front Home Layout: Multi-level;Able to live on main level with bedroom/bathroom Home Equipment: Shower seat Additional Comments: states he has not needed AD so does not own one    Prior Function Level  of Independence: Independent         Comments: previously working over last few months     Hand Dominance   Dominant Hand: Right    Extremity/Trunk Assessment   Upper Extremity Assessment Upper Extremity Assessment: Generalized weakness;RUE deficits/detail RUE Deficits / Details: very weak with less than 3/5 strength esp shoulder RUE Coordination: decreased gross motor;decreased fine motor LUE Deficits / Details: L humeral bone lesions LUE Coordination: decreased fine motor;decreased  gross motor    Lower Extremity Assessment Lower Extremity Assessment: Generalized weakness    Cervical / Trunk Assessment Cervical / Trunk Assessment: Kyphotic  Communication   Communication: Expressive difficulties(lethargic andslow to respond to vc's)  Cognition Arousal/Alertness: Awake/alert;Lethargic Behavior During Therapy: WFL for tasks assessed/performed Overall Cognitive Status: Impaired/Different from baseline Area of Impairment: Orientation;Attention;Memory;Following commands;Safety/judgement;Problem solving;Awareness                 Orientation Level: Situation Current Attention Level: Selective Memory: Decreased recall of precautions;Decreased short-term memory Following Commands: Follows one step commands inconsistently;Follows one step commands with increased time Safety/Judgement: Decreased awareness of safety;Decreased awareness of deficits Awareness: Intellectual Problem Solving: Slow processing;Decreased initiation;Difficulty sequencing;Requires verbal cues;Requires tactile cues General Comments: pt requires significant cues to set up and maintain sitting posture      General Comments General comments (skin integrity, edema, etc.): pt has condom cath but is off when PT arrived, nursing notified    Exercises General Exercises - Lower Extremity Ankle Circles/Pumps: AAROM;5 reps;Both Long Arc Quad: AROM;AAROM;Both;5 reps Heel Slides: AROM;AAROM;Both;10 reps   Assessment/Plan    PT Assessment Patient needs continued PT services  PT Problem List Decreased strength;Decreased range of motion;Decreased activity tolerance;Decreased balance;Decreased mobility;Decreased coordination;Decreased cognition;Decreased knowledge of use of DME;Decreased knowledge of precautions;Decreased safety awareness;Cardiopulmonary status limiting activity;Obesity       PT Treatment Interventions DME instruction;Gait training;Functional mobility training;Therapeutic  activities;Therapeutic exercise;Balance training;Neuromuscular re-education;Patient/family education    PT Goals (Current goals can be found in the Care Plan section)  Acute Rehab PT Goals Patient Stated Goal: to get stronger PT Goal Formulation: Patient unable to participate in goal setting Time For Goal Achievement: 01/20/17 Potential to Achieve Goals: Good    Frequency Min 2X/week   Barriers to discharge Other (comment)(pt is a two person lift and wife is single caregiver) dependent care needs    Co-evaluation               AM-PAC PT "6 Clicks" Daily Activity  Outcome Measure Difficulty turning over in bed (including adjusting bedclothes, sheets and blankets)?: Unable Difficulty moving from lying on back to sitting on the side of the bed? : Unable Difficulty sitting down on and standing up from a chair with arms (e.g., wheelchair, bedside commode, etc,.)?: Unable Help needed moving to and from a bed to chair (including a wheelchair)?: Total Help needed walking in hospital room?: Total Help needed climbing 3-5 steps with a railing? : Total 6 Click Score: 6    End of Session Equipment Utilized During Treatment: Oxygen;Gait belt Activity Tolerance: Patient tolerated treatment well;Patient limited by fatigue;Patient limited by lethargy;Other (comment)(R side weakness and ability to focus on his deficits) Patient left: in bed;with call bell/phone within reach;with bed alarm set Nurse Communication: Mobility status;Other (comment)(notified nursing cath was off when PT arrived, lunch arrived) PT Visit Diagnosis: Unsteadiness on feet (R26.81);Muscle weakness (generalized) (M62.81);Other abnormalities of gait and mobility (R26.89);Hemiplegia and hemiparesis Hemiplegia - Right/Left: Right Hemiplegia - dominant/non-dominant: Dominant Hemiplegia - caused by: Unspecified    Time: 1610-9604 PT Time  Calculation (min) (ACUTE ONLY): 24 min   Charges:   PT Evaluation $PT Eval  Moderate Complexity: 1 Mod PT Treatments $Therapeutic Activity: 8-22 mins   PT G Codes:   PT G-Codes **NOT FOR INPATIENT CLASS** Functional Assessment Tool Used: AM-PAC 6 Clicks Basic Mobility   Ivar DrapeRuth E Troy Hartzog 01/06/2017, 3:27 PM   Samul Dadauth Dmarius Reeder, PT MS Acute Rehab Dept. Number: [facility] [phone] and [facility] [phone]

## 2017-01-06 NOTE — Progress Notes (Signed)
  Speech Language Pathology Treatment: Dysphagia  Patient Details Name: [name] [name] MRN: [id number] DOB: [birth year] Today's Date: 01/06/2017 Time: 2536-6440 SLP Time Calculation (min) (ACUTE ONLY): 14 min  Assessment / Plan / Recommendation Clinical Impression  Pt easily aroused from sleep. Did not eat breakfast due to decreased alertness. Can almost get hand to mouth for self feeding with graham cracker but needs assist. Endentulous but masticated cracker without delays or residue and reports he is masticating food without difficulty. No cough during skilled treatment with solids or straw sips thin. CXR yesterday showed improved aeration with less prominent bibasilar atelectasis. Recommend continue regular texture, thin liquids with assist. No further ST  treatment needed at hospital and will discharge ST.   HPI HPI: Pt is a 76 yo M with heavy ETOH abuse with likely UGIB and significant anemia. Lung exam suggestive of RLL consolidation. Per the family, he was seen at [facility] 2 days ago; was told his Hb was low but left AMA. He was also at [facility] 2-3 weeks ago and was intubated for ETOH withdrawal. UGI 12/17 showed non-bleeding gastric ulcer. PMH also includes COPD, a fib, and HTN.      SLP Plan  All goals met;Discharge SLP treatment due to (comment)       Recommendations  Diet recommendations: Regular;Thin liquid Liquids provided via: Cup;Straw Medication Administration: Whole meds with liquid Supervision: Staff to assist with self feeding;Intermittent supervision to cue for compensatory strategies Compensations: Slow rate;Small sips/bites;Minimize environmental distractions Postural Changes and/or Swallow Maneuvers: Seated upright 90 degrees                Oral Care Recommendations: Oral care BID Follow up Recommendations: None SLP Visit Diagnosis: Dysphagia, unspecified (R13.10) Plan: All goals met;Discharge SLP treatment due to (comment)       GO                 Houston Siren 01/06/2017, 11:07 AM   Orbie Pyo Colvin Caroli.Ed [employer] [phone]

## 2017-01-06 NOTE — Progress Notes (Signed)
PT Cancellation Note  Patient Details Name: [name] [name] MRN: [id number] DOB: 1940-03-13   Cancelled Treatment:    Reason Eval/Treat Not Completed: Patient's level of consciousness.  Will try later to see pt.   Ivar DrapeRuth E Fia Hebert 01/06/2017, 11:34 AM   Samul Dadauth Taneika Choi, PT MS Acute Rehab Dept. Number: [facility] [phone] and [facility] [phone]

## 2017-01-07 ENCOUNTER — Inpatient Hospital Stay (HOSPITAL_COMMUNITY): Payer: Medicare Other

## 2017-01-07 DIAGNOSIS — I4891 Unspecified atrial fibrillation: Secondary | ICD-10-CM

## 2017-01-07 LAB — POCT I-STAT 3, ART BLOOD GAS (G3+)
ACID-BASE EXCESS: 11 mmol/L — AB (ref 0.0–2.0)
ACID-BASE EXCESS: 10 mmol/L — AB (ref 0.0–2.0)
BICARBONATE: 38.1 mmol/L — AB (ref 20.0–28.0)
BICARBONATE: 36 mmol/L — AB (ref 20.0–28.0)
O2 SAT: 89 %
O2 Saturation: 96 %
TCO2: 40 mmol/L — ABNORMAL HIGH (ref 22–32)
TCO2: 38 mmol/L — AB (ref 22–32)
pCO2 arterial: 63.6 mmHg — ABNORMAL HIGH (ref 32.0–48.0)
pCO2 arterial: 59.4 mmHg — ABNORMAL HIGH (ref 32.0–48.0)
pH, Arterial: 7.381 (ref 7.350–7.450)
pH, Arterial: 7.391 (ref 7.350–7.450)
pO2, Arterial: 79 mmHg — ABNORMAL LOW (ref 83.0–108.0)
pO2, Arterial: 59 mmHg — ABNORMAL LOW (ref 83.0–108.0)

## 2017-01-07 LAB — BASIC METABOLIC PANEL
ANION GAP: 9 (ref 5–15)
BUN: 15 mg/dL (ref 6–20)
CHLORIDE: 92 mmol/L — AB (ref 101–111)
CO2: 36 mmol/L — ABNORMAL HIGH (ref 22–32)
Calcium: 8.3 mg/dL — ABNORMAL LOW (ref 8.9–10.3)
Creatinine, Ser: 0.83 mg/dL (ref 0.61–1.24)
GFR calc non Af Amer: >60 mL/min (ref >60)
Glucose, Bld: 125 mg/dL — ABNORMAL HIGH (ref 65–99)
POTASSIUM: 3.8 mmol/L (ref 3.5–5.1)
SODIUM: 137 mmol/L (ref 135–145)

## 2017-01-07 LAB — BLOOD GAS, ARTERIAL
Acid-Base Excess: 12.4 mmol/L — ABNORMAL HIGH (ref 0.0–2.0)
Bicarbonate: 37.4 mmol/L — ABNORMAL HIGH (ref 20.0–28.0)
Drawn by: 277551
O2 CONTENT: 4 L/min
O2 SAT: 86 %
PATIENT TEMPERATURE: 98.6
PO2 ART: 50.7 mmHg — AB (ref 83.0–108.0)
pCO2 arterial: 57.8 mmHg — ABNORMAL HIGH (ref 32.0–48.0)
pH, Arterial: 7.427 (ref 7.350–7.450)

## 2017-01-07 LAB — CBC
HCT: 28.7 % — ABNORMAL LOW (ref 39.0–52.0)
HEMOGLOBIN: 8.7 g/dL — AB (ref 13.0–17.0)
MCH: 28.5 pg (ref 26.0–34.0)
MCHC: 30.3 g/dL (ref 30.0–36.0)
MCV: 94.1 fL (ref 78.0–100.0)
Platelets: 137 10*3/uL — ABNORMAL LOW (ref 150–400)
RBC: 3.05 MIL/uL — AB (ref 4.22–5.81)
RDW: 17.2 % — ABNORMAL HIGH (ref 11.5–15.5)
WBC: 9 10*3/uL (ref 4.0–10.5)

## 2017-01-07 LAB — VITAMIN B1: VITAMIN B1 (THIAMINE): 239.1 nmol/L — AB (ref 66.5–200.0)

## 2017-01-07 MED ORDER — METHYLPREDNISOLONE SODIUM SUCC 125 MG IJ SOLR
80.0000 mg | Freq: Three times a day (TID) | INTRAMUSCULAR | Status: DC
  Administered 2017-01-07 – 2017-01-08 (×3): 80 mg via INTRAVENOUS
  Filled 2017-01-07 (×2): qty 2

## 2017-01-07 MED ORDER — DEXTROSE 5 % IV SOLN
500.0000 mg | INTRAVENOUS | Status: DC
  Administered 2017-01-07 – 2017-01-11 (×5): 500 mg via INTRAVENOUS
  Filled 2017-01-07 (×6): qty 500

## 2017-01-07 MED ORDER — PANTOPRAZOLE SODIUM 40 MG PO TBEC
40.0000 mg | DELAYED_RELEASE_TABLET | Freq: Two times a day (BID) | ORAL | Status: DC
  Administered 2017-01-07 – 2017-01-17 (×17): 40 mg via ORAL
  Filled 2017-01-07 (×17): qty 1

## 2017-01-07 MED ORDER — PRO-STAT SUGAR FREE PO LIQD
30.0000 mL | Freq: Two times a day (BID) | ORAL | Status: DC
  Administered 2017-01-07 – 2017-01-17 (×12): 30 mL via ORAL
  Filled 2017-01-07 (×12): qty 30

## 2017-01-07 MED ORDER — IPRATROPIUM-ALBUTEROL 0.5-2.5 (3) MG/3ML IN SOLN
3.0000 mL | Freq: Four times a day (QID) | RESPIRATORY_TRACT | Status: DC
  Administered 2017-01-07 – 2017-01-08 (×3): 3 mL via RESPIRATORY_TRACT
  Filled 2017-01-07 (×3): qty 3

## 2017-01-07 MED ORDER — METHYLPREDNISOLONE SODIUM SUCC 125 MG IJ SOLR
INTRAMUSCULAR | Status: AC
Start: 2017-01-07 — End: 2017-01-07
  Administered 2017-01-07: 13:00:00
  Filled 2017-01-07: qty 2

## 2017-01-07 NOTE — Progress Notes (Signed)
RT called to the bedside to place pt on BIPAP per RT. Found pt on NRB - RT removed NRB and placed pt on BIPAP with no complications. Mask secure. No breakdown noted. Pt tol well

## 2017-01-07 NOTE — Progress Notes (Signed)
Pt was transported to 2h-10 on BiPap tolerated transition well and family is visiting and waiting in family room. [facility] to [facility].

## 2017-01-07 NOTE — Significant Event (Signed)
Rapid Response Event Note  Overview: Time Called: 1251 Arrival Time: 1253 Event Type: Respiratory  Initial Focused Assessment: Patient with increased work of breathing and increasing O2 needs.   Patient on NRB O2 sats 89-92%  RR 18-24 BP 116/58  AF 90-100s   Lung sounds diminished. Family at bedside,  Dr Selena BattenKim at bedside  Interventions: Placed on Bipap  O2 sats 95-100%  RR 18 Patient looks more comfortable.   1430  Patient transported to 2H10  Via bed with O2 via bipap and heart monitor.  [facility]  2H RN aware that family is in waiting room.    Plan of Care (if not transferred):  Event Summary: Name of Physician Notified: Dr Selena BattenKim at bedside at      at    Outcome: [facility](2H10)  Event End Time: 1450  [name] [name], [name] [name]

## 2017-01-07 NOTE — Clinical Social Work Note (Signed)
CSW spoke with patient's wife and son regarding SNF recommendation. He has been to a facility before and they do not want to put him in one again. They are concerned that would prevent progress. Patient has multiple family members that can assist in his care. He is not currently set up with a home health agency but family is agreeable to that. RNCM notified.  CSW signing off. Consult again if any other social work needs arise.  Charlynn CourtSarah Ryonna Cimini, CSW [phone]

## 2017-01-07 NOTE — Progress Notes (Signed)
Patient ID: [name] [name], male   DOB: 22-Jun-1940, 76 y.o.   MRN: [id number]  Xcover Pt hypoxic o2 sat 67%,  -80's w nonrebreather, denies cp, palp, orthopnea, pnd, lower ext edema. .  Pt is a smoker.   Heent: anicteric Neck: no jvd Heart: rrr s1, s2 Lung: tight,  Slight exp wheezing. No crackles.  Abd: soft Ext: no c/c/e  A/p Acute hypoxic//hypercapneic respiratory failure bipap Abg Solumedrol 80mg  iv q8h Increase duoneb to qid, and q2h prn zithromax 500mg  iv qday CXR  Transfer to stepdown  Critical care time 30 minutes

## 2017-01-07 NOTE — Progress Notes (Signed)
Pt is drowsy arousal, hoarse, coarse with diminished lung sounds, and not tolerating PO med's in apple sauce, uses straw no cough weak cough suction. Total care given with incontinent stool. Condom cath replaced, Resp,Speech, and PT following.

## 2017-01-07 NOTE — Progress Notes (Signed)
RT transported pt on BIPAP same settings with no complications.

## 2017-01-07 NOTE — Plan of Care (Signed)
  Nutrition: Adequate nutrition will be maintained 01/07/2017 1009 - Not Progressing by Sherian MaroonKing, Sinjin Amero D, RN Variances Patient/family refused or delayed decision Note Pt refused to wake up and eat.   Elimination: Will not experience complications related to bowel motility 01/07/2017 1009 by Sherian MaroonKing, Aleczander Fandino D, RN Note Incontinent with bladder and stool    Pain Managment: General experience of comfort will improve 01/07/2017 1009 - Not Applicable by Sherian MaroonKing, Hughes Wyndham D, RN   Safety: Ability to remain free from injury will improve 01/07/2017 1009 - Progressing by Sherian MaroonKing, Priscillia Fouch D, RN   Skin Integrity: Risk for impaired skin integrity will decrease 01/07/2017 1009 - Progressing by Sherian MaroonKing, Oather Muilenburg D, RN Note Red skin sacrum blanchable excoriated skin

## 2017-01-07 NOTE — Progress Notes (Signed)
Initial Nutrition Assessment  DOCUMENTATION CODES:   Not applicable  INTERVENTION:   -Continue 30 ml Prostat BID, each supplement provides 100 kcals and 15 grams protein -Continue MVI daily -Due to mental status and prolonged poor oral intake, pt would benefit from addition of temporary nutrition support. Recommend:  Initiate Osmolite 1.5 @ 25 ml/hr and increase by 10 ml every 12 hours to goal rate of 55 ml/hr.   30 ml Prostat daily.    Tube feeding regimen provides 2080 kcal (100% of needs), 98 grams of protein, and 1006 ml of H2O.   -If nutrition support is initiated, recommend monitor K, Mg, and Phos daily x 3 days and replete as needed due to high refeeding risk  NUTRITION DIAGNOSIS:   Inadequate oral intake related to lethargy/confusion as evidenced by meal completion < 25%.  GOAL:   Patient will meet greater than or equal to 90% of their needs  MONITOR:   PO intake, Supplement acceptance, Diet advancement, Labs, Weight trends, Skin, I & O's  REASON FOR ASSESSMENT:   Low Braden    ASSESSMENT:   76 yo M with heavy ETOH abuse with likely UGIB and significant anemia. Lung exam suggestive of RLL consolidation. Although CXR was negative, an infiltrate might become apparent after hydration. Although ETOH level is low need to be concerned about withdrawal.  12/17- s/p EGD, which revealed antral ulcer which was biopsied. Pathology revealed ulcerative gastritis 12/18- s/p BSE, recommend regular consistency diet with thin liquids  Reviewed CWOCN note from 12/27/16; pt with full thickness wounds x 3 on bilateral buttocks and sacrum.   Spoke with nurse tech, who requested that RD hold off on evaluation at time of visit. Unable to complete nutrition-focused physical exam or obtain further nutrition hx on pt.   Pt with very minimal intake related to decreased alertness. Noted meal completion 0% since 01/02/17. Pt is taking Prostat supplements and MVI as ordered per MAR.    Reviewed wt hx; noted pt has experienced a 15.5% wt loss over the past 6 months, which is significant for time frame. Unsure how much fluid losses are contributing to weight loss.   After RD visit, noted pt developed respiratory distressed and was transferred to ICU on Bi-pap.   Labs reviewed.   Diet Order:  Diet Heart Room service appropriate? Yes; Fluid consistency: Thin; Fluid restriction: 1500 mL Fluid  EDUCATION NEEDS:   Not appropriate for education at this time  Skin:  Skin Assessment: Skin Integrity Issues: Skin Integrity Issues:: Other (Comment) Other: full thickness wounds x 3 on bilateral buttocks and sacrum  Last BM:  01/06/17  Height:   Ht Readings from Last 1 Encounters:  01/03/17 5\' 7"  (1.702 m)    Weight:   Wt Readings from Last 1 Encounters:  01/07/17 175 lb 11.3 oz (79.7 kg)    Ideal Body Weight:  67.3 kg  BMI:  Body mass index is 27.52 kg/m.  Estimated Nutritional Needs:   Kcal:  2000-2200  Protein:  100-115 grams  Fluid:  > 2.0 L    Nazia Rhines A. Mayford KnifeWilliams, RD, LDN, CDE Pager: [phone] After hours Pager: [phone]

## 2017-01-07 NOTE — Progress Notes (Signed)
Dr Selena Battenkim called about recent abg results. MD notified po2 @59  Pco2 @59  and fio2 on bipap at 50%. MD ok with results and wants to maintain current setting on bipap and then repeat abg in the am.

## 2017-01-07 NOTE — Care Management Important Message (Signed)
Important Message  Patient Details  Name: [name] [name] MRN: [id number] Date of Birth: 1940-06-23   Medicare Important Message Given:  Yes    [name], [name] 01/07/2017, 9:29 AM

## 2017-01-07 NOTE — Progress Notes (Addendum)
Neb txs done. RT increased FIO2 4L [place] due to low sats. BBS exp wh. Pt upper airway crs, grunting  sounds. RN notified.

## 2017-01-07 NOTE — Progress Notes (Signed)
Patient ID: [name] [name], male   DOB: 1940-04-17, 76 y.o.   MRN: [id number]                                                                PROGRESS NOTE                                                                                                                                                                                                             Patient Demographics:    [name] [name], is a 76 y.o. male, DOB - 1940/10/24, QIW:[id number]  Admit date - 12/26/2016   Admitting Physician Jamesetta So, MD  Outpatient Primary MD for the patient is Park, Barry Dienes, MD  LOS - 12  Outpatient Specialists:    No chief complaint on file.      Brief Narrative   76 y.o. W M with a hx of significant ETOH abuse; per the family ~ 4 drinks of [name] per day and occasionally several bottles a day, who presented to [facility] with weakness, exertional dyspnea And melanotic stools for several days.  Pt was found to have gastric ulcer (non H pylori) and recommendations for indefinite PPI were given by GI. Hospital course complicated by fever of unknown origin, atrial fibrillation with rvr, and acute on chronic diastolic HF.      Subjective:    [name] [name] today is feeling better.  Per RN slightly groggy  ? Due to valium.  No headache, No chest pain, No abdominal pain - No Nausea, No new weakness tingling or numbness, No Cough - SOB.    Assessment  & Plan :    Principal Problem:   Acute respiratory failure with hypoxia (HCC) Active Problems:   GI bleed requiring more than 4 units of blood in 24 hours, ICU, or surgery   Melena   Antral ulcer, chronic   Acute blood loss anemia   Alcoholic cirrhosis of liver without ascites (HCC)   SOB (shortness of breath)   Fever   A-fib (HCC)   Acute diastolic CHF (congestive heart failure) (HCC)   Gastric ulcer: Per EGD 12/27/2016   Lytic lesion of bone on x-ray: Left humeral   Acute encephalopathy   UGI bleed   #1 acute respiratory  failure with hypoxia Questionable etiology.  Initially  felt patient was likely in volume overload per chest x-ray findings and elevated BNP and patient also noted to be in A. fib with RVR.  Improved clinically.  Patient currently off the BiPAP with sats of 96% on 4 L nasal cannula.  ABG with a normal pH with a elevated PCO2 and normal O2.  On nasal cannula.  Patient noted yesterday with complaints of shortness of breath in the afternoon when patient reexamined patient was noted to have significant congestion.  Patient started on Mucinex DM, chest PT, Claritin, scheduled nebs, 3% saline nebulizer treatment with clinical improvement.  Change IV Lasix to oral Lasix.  Follow.    2.  Probable acute on chronic diastolic CHF exacerbation Questionable etiology.  Felt likely triggered by A. fib with RVR versus volume overload from packed red blood cell transfusions.  Chest x-ray was noted to have cardiomegaly and mild pulmonary vascular congestion 12/30/2016.  NP noted on 12/30/2016 to be elevated at 262.4.  Patient off BiPAP.  Patient on IV Lasix with a urine output of 0.700 L over the past 24 hours.  Current weight is 177 pounds from 179 pounds from 181 pounds from 187 pounds from 201 pounds (01/02/2017) from a weight of 214 pounds on 12/29/2016.  [name] on 08/12/2016 at cardiologist office was 207 pounds.  Change IV Lasix back to home dose oral Lasix.  Will continue with oral lasix today.   3.  Chronic atrial fibrillation Patient noted to be in A. fib with RVR and had to be placed on the Cardizem drip.  Heart rate improved currently in the 90s.  Patient off BiPAP.  Discontinued Cardizem drip and resumed home regimen of Cardizem 120 mg daily.  Anticoagulation on hold due to recent GI bleed.  Follow.  4.  Acute upper GI bleed secondary to gastric ulcer Patient with no further tarry stools noted. Patient status post upper endoscopy which showed gastric ulcer which was  negative for H. pylori and benign per biopsy.  Upper endoscopy showed no signs of portal gastropathy or varices.  Pathology suggested ulcerative gastritis.  Status post 1 unit packed red blood cells.  Hemoglobin stable at 8.2. Continue PPI.  will switch to oral PPI today.  Outpatient follow-up with GI.  5.  Acute blood loss anemia Secondary to problem #4.  Status post transfusion 1 unit packed red blood cells.  Continue PPI.  Hemoglobin currently at 8.2 from 8.4 from 7.6 from 7.6 from 8.3.  Follow H&H.  6.  Acute encephalopathy Patient more alert today after Librium dose was decreased yesterday.  Patient noted to have gotten some Librium 25 mg the night of 01/04/2017, after Librium detox protocol.  Ammonia level at 20.  TSH within normal limits.  Thiamine levels pending.  RPR nonreactive.  HIV reactive.  Patient currently off BiPAP.  ABG noted and normal pH with a PCO2 in the 60s.  CT head negative for any acute abnormalities.  Follow.  7.  Hypokalemia Secondary to diuresis.  Magnesium level at 2.2.  Repleted.  8.  Fever Questionable etiology.  Currently afebrile.  Urinalysis unremarkable.  Repeat chest x-ray has prior chest x-ray worrisome for volume overload.  Cultures from 12/27/2016 are negative.  Blood cultures from 12/31/2016 with no growth to date.  Urine cultures were negative.  MRSA PCR negative.  Fever curve trended down.  Patient status post 1 week of IV antibiotics of vancomycin and Zosyn.  No need for any further antibiotics at this time.  9.  Lytic lesions in left humerus, left fibula and skull /??  Myeloma  Dedicated humeral films with lytic lesion.  Skeletal survey with lytic lesions in the left humerus, left fibular and skull.  Concern for myeloma.  SPEP UPEP pending.  Will likely need outpatient follow-up with hematology/oncology.   10.  Alcoholic cirrhosis Ammonia levels within normal limits. Per upper endoscopy no signs of portal gastropathy or varices.  Outpatient  follow-up with GI.  11.  Alcohol dependence Per family patient has been abstinent for close to 1-2 months.  Patient noted to be agitated however slowly improving.  Patient more alert today after Librium dose decreased yesterday. Continue thiamine, folate. Alcohol cessation.  Ativan withdrawal protocol was discontinued and patient now on Librium detox protocol.  Decreased Librium dose to 15 mg as needed and scheduled due to patient's increased drowsiness on 01/05/2017.    DVT prophylaxis: SCDs Code Status: DNR Family Communication: No family at bedside.  I spoke with wife and she desires home with home health, declines SNF Disposition Plan: wife and patient decline SNF, once medically stable and patient improved and at baseline.     Consultants:   Gastroenterology: Dr. Carlean Purl 12/27/2016  Procedures:   Chest x-ray 12/27/2016, 12/28/2016, 12/30/2016, 12/31/2016, 01/05/2017  2D echo 12/31/2016  Upper endoscopy 12/27/2016 per Dr. Carlean Purl, nonbleeding gastric ulcer noted  Transfused 1 unit packed red blood cells 12/29/2016  Right lower extremity Dopplers 01/03/2017--- negative for DVT, negative for Baker's cyst  Skeletal survey 01/03/2017  CT head 01/05/2017  Antimicrobials:   IV Zosyn 12/27/2016>>>> 12/28/2016  IV Zosyn 12/31/2016>>>>>> 01/04/2017  IV vancomycin 12/27/2016>>>>> 12/28/2016     Lab Results  Component Value Date   PLT 117 (L) 01/06/2017     Anti-infectives (From admission, onward)   Start     Dose/Rate Route Frequency Ordered Stop   12/31/16 1000  piperacillin-tazobactam (ZOSYN) IVPB 3.375 g  Status:  Discontinued     3.375 g 12.5 mL/hr over 240 Minutes Intravenous Every 8 hours 12/31/16 0912 01/04/17 1036   12/31/16 1000  vancomycin (VANCOCIN) 1,250 mg in sodium chloride 0.9 % 250 mL IVPB  Status:  Discontinued     1,250 mg 166.7 mL/hr over 90 Minutes Intravenous Every 12 hours 12/31/16 0912 01/02/17 1102   12/27/16 1000   piperacillin-tazobactam (ZOSYN) IVPB 3.375 g  Status:  Discontinued     3.375 g 12.5 mL/hr over 240 Minutes Intravenous Every 8 hours 12/27/16 0920 12/28/16 0922   12/27/16 1000  vancomycin (VANCOCIN) 1,250 mg in sodium chloride 0.9 % 250 mL IVPB  Status:  Discontinued     1,250 mg 166.7 mL/hr over 90 Minutes Intravenous Every 12 hours 12/27/16 0921 12/28/16 0922   12/26/16 2100  cefTRIAXone (ROCEPHIN) 1 g in dextrose 5 % 50 mL IVPB  Status:  Discontinued     1 g 100 mL/hr over 30 Minutes Intravenous Every 24 hours 12/26/16 2004 12/27/16 0920        Objective:   Vitals:   01/07/17 0241 01/07/17 0517 01/07/17 0700 01/07/17 0755  BP:  119/65  128/75  Pulse:  86 95   Resp:  20  18  Temp:      TempSrc:      SpO2:  97%  95%  Weight: 79.3 kg (174 lb 13.2 oz)     Height:        Wt Readings from Last 3 Encounters:  01/07/17 79.3 kg (174 lb 13.2 oz)  11/15/16 97 kg (213 lb 12.8 oz)  08/12/16 93.9 kg (207 lb)     Intake/Output Summary (Last 24 hours) at 01/07/2017 0847 Last data filed at 01/07/2017 0322 Gross per 24 hour  Intake 360 ml  Output 300 ml  Net 60 ml     Physical Exam  Awake Alert, Oriented X 3, No new F.N deficits, Normal affect [place].AT,PERRAL Supple Neck,No JVD, No cervical lymphadenopathy appriciated.  Symmetrical Chest wall movement, Good air movement bilaterally, CTAB RRR,No Gallops,Rubs or new Murmurs, No Parasternal Heave +ve B.Sounds, Abd Soft, No tenderness, No organomegaly appriciated, No rebound - guarding or rigidity. No Cyanosis, Clubbing or edema, Slight skin breakdown, no obvious sacral ulcer,  On foam dressing for protection.  Incontinence of feces, brown     Data Review:    CBC Recent Labs  Lab 01/02/17 0032 01/03/17 0450 01/04/17 0538 01/05/17 0624 01/06/17 0630  WBC 7.2 6.4 5.2 6.1 8.3  HGB 8.3* 7.6* 7.6* 8.4* 8.2*  HCT 27.9* 24.6* 24.6* 27.9* 26.5*  PLT 144* 136* 134* 131* 117*  MCV 96.9 93.2 92.8 94.9 92.3  MCH 28.8 28.8 28.7  28.6 28.6  MCHC 29.7* 30.9 30.9 30.1 30.9  RDW 17.6* 17.0* 17.3* 17.4* 17.0*    Chemistries  Recent Labs  Lab 12/31/16 1822  01/02/17 0915 01/03/17 0450 01/04/17 0538 01/05/17 0624 01/06/17 0630  NA 137   < > 142 135 135 137 136  K 3.7   < > 3.0* 3.0* 3.0* 3.5 3.9  CL 97*   < > 95* 90* 89* 92* 91*  CO2 32   < > 39* 37* 39* 38* 35*  GLUCOSE 106*   < > 105* 116* 126* 128* 110*  BUN 11   < > 18 17 12 11 10   CREATININE 1.01   < > 1.05 1.03 0.97 0.81 0.76  CALCIUM 8.2*   < > 8.3* 8.0* 7.9* 8.4* 8.2*  MG 1.7  --  1.7 1.8 2.2  --   --   AST  --   --   --  23  --   --   --   ALT  --   --   --  13*  --   --   --   ALKPHOS  --   --   --  47  --   --   --   BILITOT  --   --   --  0.6  --   --   --    < > = values in this interval not displayed.   ------------------------------------------------------------------------------------------------------------------ No results for input(s): CHOL, HDL, LDLCALC, TRIG, CHOLHDL, LDLDIRECT in the last 72 hours.  No results found for: HGBA1C ------------------------------------------------------------------------------------------------------------------ No results for input(s): TSH, T4TOTAL, T3FREE, THYROIDAB in the last 72 hours.  Invalid input(s): FREET3 ------------------------------------------------------------------------------------------------------------------ No results for input(s): VITAMINB12, FOLATE, FERRITIN, TIBC, IRON, RETICCTPCT in the last 72 hours.  Coagulation profile No results for input(s): INR, PROTIME in the last 168 hours.  No results for input(s): DDIMER in the last 72 hours.  Cardiac Enzymes No results for input(s): CKMB, TROPONINI, MYOGLOBIN in the last 168 hours.  Invalid input(s): CK ------------------------------------------------------------------------------------------------------------------    Component Value Date/Time   BNP 262.4 (H) 12/30/2016 0908    Inpatient Medications  Scheduled Meds: .  budesonide (PULMICORT) nebulizer solution  0.5 mg Nebulization BID  . dextromethorphan-guaiFENesin  2 tablet Oral BID  . diltiazem  120 mg Oral q morning - 10a  . ferrous sulfate  325 mg Oral Daily  . fluticasone  2 spray Each  Nare Daily  . furosemide  40 mg Oral BID  . ipratropium-albuterol  3 mL Nebulization TID  . loratadine  10 mg Oral Daily  . mouth rinse  15 mL Mouth Rinse BID  . multivitamin with minerals  1 tablet Oral Daily  . nicotine  21 mg Transdermal Daily  . pantoprazole (PROTONIX) IV  40 mg Intravenous Q24H  . potassium chloride  40 mEq Oral Daily  . tamsulosin  0.4 mg Oral QPC supper  . thiamine  100 mg Oral Daily   Continuous Infusions: . sodium chloride Stopped (12/29/16 1100)   PRN Meds:.sodium chloride, acetaminophen, acetaminophen, ipratropium-albuterol, morphine injection  Micro Results Recent Results (from the past 240 hour(s))  Culture, blood (Routine X 2) w Reflex to ID Panel     Status: None   Collection Time: 12/31/16  9:28 AM  Result Value Ref Range Status   Specimen Description BLOOD RIGHT HAND  Final   Special Requests   Final    BOTTLES DRAWN AEROBIC AND ANAEROBIC Blood Culture adequate volume   Culture NO GROWTH 5 DAYS  Final   Report Status 01/05/2017 FINAL  Final  Culture, Urine     Status: None   Collection Time: 12/31/16  9:34 AM  Result Value Ref Range Status   Specimen Description URINE, RANDOM  Final   Special Requests NONE  Final   Culture NO GROWTH  Final   Report Status 01/01/2017 FINAL  Final  Culture, blood (Routine X 2) w Reflex to ID Panel     Status: None   Collection Time: 12/31/16  9:35 AM  Result Value Ref Range Status   Specimen Description BLOOD LEFT HAND  Final   Special Requests   Final    BOTTLES DRAWN AEROBIC AND ANAEROBIC Blood Culture results may not be optimal due to an inadequate volume of blood received in culture bottles   Culture NO GROWTH 5 DAYS  Final   Report Status 01/05/2017 FINAL  Final    Radiology  Reports Ct Head Wo Contrast  Result Date: 01/05/2017 CLINICAL DATA:  Altered level of consciousness. EXAM: CT HEAD WITHOUT CONTRAST TECHNIQUE: Contiguous axial images were obtained from the base of the skull through the vertex without intravenous contrast. COMPARISON:  None. FINDINGS: Brain: There is atrophy and chronic small vessel disease changes. No acute intracranial abnormality. Specifically, no hemorrhage, hydrocephalus, mass lesion, acute infarction, or significant intracranial injury. Vascular: No hyperdense vessel or unexpected calcification. Skull: No acute calvarial abnormality. Sinuses/Orbits: No acute finding. Other: None IMPRESSION: No acute intracranial abnormality. Atrophy, chronic microvascular disease. Electronically Signed   By: Rolm Baptise M.D.   On: 01/05/2017 11:35   Dg Chest Port 1 View  Result Date: 01/05/2017 CLINICAL DATA:  Shortness of breath and congestion. EXAM: PORTABLE CHEST 1 VIEW COMPARISON:  01/02/2017 FINDINGS: Stable top-normal heart size. Improved aeration at the lung bases with less prominent bibasilar atelectasis. There is no evidence of pulmonary edema, consolidation, pneumothorax, nodule or pleural fluid. IMPRESSION: Improved aeration with less prominent bibasilar atelectasis. Electronically Signed   By: Aletta Edouard M.D.   On: 01/05/2017 14:59   Dg Chest Port 1 View  Result Date: 01/02/2017 CLINICAL DATA:  Shortness of breath. EXAM: PORTABLE CHEST 1 VIEW COMPARISON:  Radiograph of December 31, 2016. FINDINGS: Stable cardiomegaly. No pneumothorax or pleural effusion is noted. Stable bibasilar subsegmental atelectasis or infiltrates are noted. Bony thorax is unremarkable. IMPRESSION: Stable bibasilar subsegmental atelectasis or infiltrates. Electronically Signed   By: Marijo Conception, M.D.  On: 01/02/2017 10:35   Dg Chest Port 1 View  Result Date: 12/31/2016 CLINICAL DATA:  Shortness of breath. EXAM: PORTABLE CHEST 1 VIEW COMPARISON:  Radiograph of  December 30, 2016. FINDINGS: Stable cardiomegaly. No pneumothorax is noted. Mild bibasilar atelectasis or infiltrates are noted, right greater than left. No definite pleural effusion is noted. Bony thorax is unremarkable. IMPRESSION: Mild bibasilar subsegmental atelectasis or infiltrates are noted. Electronically Signed   By: Marijo Conception, M.D.   On: 12/31/2016 09:54   Dg Chest Port 1 View  Result Date: 12/30/2016 CLINICAL DATA:  Shortness of breath. EXAM: PORTABLE CHEST 1 VIEW COMPARISON:  12/28/2016 FINDINGS: Bilateral mild diffuse interstitial thickening. No focal consolidation, pleural effusion or pneumothorax. Stable cardiomegaly. No acute osseous abnormality. Lytic lesion in the proximal humeral diaphysis partially visualize measuring 2.7 x 1.1 cm of indeterminate etiology. IMPRESSION: 1. Cardiomegaly with mild pulmonary vascular congestion. 2. Lytic lesion in the proximal humeral diaphysis partially visualize measuring 2.7 x 1.1 cm of indeterminate etiology. Recommend dedicated x-rays of the left humerus. Electronically Signed   By: Kathreen Devoid   On: 12/30/2016 09:50   Dg Chest Port 1 View  Result Date: 12/28/2016 CLINICAL DATA:  Acute respiratory failure. EXAM: PORTABLE CHEST 1 VIEW COMPARISON:  Radiograph of December 27, 2016. FINDINGS: Stable cardiomegaly. No pneumothorax is noted. Elevated left hemidiaphragm is noted. Stable central pulmonary vascular congestion is noted. Mildly increased interstitial densities are noted throughout the right lung concerning for pulmonary edema. Bony thorax is unremarkable. No significant pleural effusion is noted. IMPRESSION: Stable cardiomegaly and central pulmonary vascular congestion. Mildly increased interstitial densities are noted throughout the right lung concerning for pulmonary edema. Electronically Signed   By: Marijo Conception, M.D.   On: 12/28/2016 07:45   Dg Chest Port 1 View  Result Date: 12/27/2016 CLINICAL DATA:  76 year old male with  hypoxia and shortness breath. Subsequent encounter. EXAM: PORTABLE CHEST 1 VIEW COMPARISON:  12/26/2016 and 06/09/2016 chest x-ray. FINDINGS: Cardiomegaly with pulmonary vascular congestion and Kerley B lines suggesting mild pulmonary edema. No pneumothorax. Calcified aorta. IMPRESSION: Cardiomegaly. Mild pulmonary edema. Aortic Atherosclerosis (ICD10-I70.0). Electronically Signed   By: Genia Del M.D.   On: 12/27/2016 07:16   Dg Bone Survey Met  Result Date: 01/03/2017 CLINICAL DATA:  Lytic lesion of the left humerus. EXAM: METASTATIC BONE SURVEY COMPARISON:  Radiographs dated 01/02/2017 and 12/30/2016 FINDINGS: Again noted is the lytic lesion in lateral aspect of the proximal left humeral shaft, measuring 3.6 x 1.0 cm on this exam. There is a 12 x 3 mm lucency in the distal fibular shaft. There is a subtle 9 mm lytic area in one of the temporal bones. No other lytic lesions are noted in the skeleton. 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 There is multilevel degenerative disc and joint disease in the cervical spine. Aortic atherosclerosis. IMPRESSION: Lytic lesions in the left humerus, left fibula, and skull. The possibility of myeloma should be considered. Electronically Signed   By: Lorriane Shire M.D.   On: 01/03/2017 12:02   Dg Humerus Left  Result Date: 01/02/2017 CLINICAL DATA:  Lytic bone lesion on radiography EXAM: LEFT HUMERUS - 2+ VIEW COMPARISON:  None FINDINGS: Proximal to the left deltoid insertion site along the proximal humeral diaphysis there is a 3.4 by 0.8 cm cortical lucent lesion. No overlying periosteal reaction. Chronic irregular appearing spurring along the common extensor tendon and common flexor tendon sites along the elbow. IMPRESSION: 1. Cortical proximal  diaphyseal humeral lucency with fairly sharp zone of transition just proximal to the deltoid insertion site. Possibilities might include myeloma, metastatic disease, lymphoma, an  old nonossifying fibroma. Correlate with any serologic indicators of myeloma and consider further skeletal workup or humerus MRI. Electronically Signed   By: Van Clines M.D.   On: 01/02/2017 12:42    Time Spent in minutes  30   Jani Gravel M.D on 01/07/2017 at 8:47 AM  Between 7am to 7pm - Pager - [phone]  After 7pm go to [email or web address] - password [email or web address]  [facility] -  Office  [phone]

## 2017-01-08 LAB — COMPREHENSIVE METABOLIC PANEL
ALBUMIN: 2.1 g/dL — AB (ref 3.5–5.0)
ALK PHOS: 58 U/L (ref 38–126)
ALT: 12 U/L — AB (ref 17–63)
AST: 13 U/L — AB (ref 15–41)
Anion gap: 9 (ref 5–15)
BILIRUBIN TOTAL: 0.7 mg/dL (ref 0.3–1.2)
BUN: 21 mg/dL — AB (ref 6–20)
CALCIUM: 8.7 mg/dL — AB (ref 8.9–10.3)
CO2: 34 mmol/L — ABNORMAL HIGH (ref 22–32)
CREATININE: 0.97 mg/dL (ref 0.61–1.24)
Chloride: 93 mmol/L — ABNORMAL LOW (ref 101–111)
GFR calc Af Amer: >60 mL/min (ref >60)
GLUCOSE: 191 mg/dL — AB (ref 65–99)
Potassium: 4.8 mmol/L (ref 3.5–5.1)
Sodium: 136 mmol/L (ref 135–145)
TOTAL PROTEIN: 6.1 g/dL — AB (ref 6.5–8.1)

## 2017-01-08 LAB — CBC
HEMATOCRIT: 31.2 % — AB (ref 39.0–52.0)
HEMOGLOBIN: 9.3 g/dL — AB (ref 13.0–17.0)
MCH: 27.8 pg (ref 26.0–34.0)
MCHC: 29.8 g/dL — ABNORMAL LOW (ref 30.0–36.0)
MCV: 93.4 fL (ref 78.0–100.0)
Platelets: 195 10*3/uL (ref 150–400)
RBC: 3.34 MIL/uL — ABNORMAL LOW (ref 4.22–5.81)
RDW: 16.8 % — ABNORMAL HIGH (ref 11.5–15.5)
WBC: 10.9 10*3/uL — AB (ref 4.0–10.5)

## 2017-01-08 MED ORDER — IPRATROPIUM-ALBUTEROL 0.5-2.5 (3) MG/3ML IN SOLN
3.0000 mL | Freq: Three times a day (TID) | RESPIRATORY_TRACT | Status: DC
  Administered 2017-01-08 – 2017-01-14 (×17): 3 mL via RESPIRATORY_TRACT
  Filled 2017-01-08 (×18): qty 3

## 2017-01-08 MED ORDER — ORAL CARE MOUTH RINSE
15.0000 mL | Freq: Two times a day (BID) | OROMUCOSAL | Status: DC
  Administered 2017-01-09 – 2017-01-17 (×11): 15 mL via OROMUCOSAL

## 2017-01-08 MED ORDER — PREDNISONE 50 MG PO TABS
60.0000 mg | ORAL_TABLET | Freq: Every day | ORAL | Status: DC
  Administered 2017-01-08: 60 mg via ORAL
  Filled 2017-01-08: qty 1

## 2017-01-08 NOTE — Progress Notes (Signed)
Patient ID: ARJUNA DOEDEN, [name] [name]: [name], 76 y.o.   MRN: [id number]                                                                PROGRESS NOTE                                                                                                                                                                                                             Patient Demographics:    [name] [name], [name] a 76 y.o. [name], [name] [name], [name] [name], [name] [name]  Admit date - 12/26/2016   Admitting Physician Jamesetta So, MD  Outpatient Primary MD for the patient [name] [name], Barry Dienes, MD  LOS - 13  [facility]     No chief complaint on file.      Brief Narrative    76 y.o. W M with a hx of significant ETOH abuse; per the family ~ 4 drinks of [name] per day and occasionally several bottles a day, who presented to [facility] with weakness, exertional dyspnea And melanotic stools for several days.  Pt was found to have gastric ulcer (non H pylori) and recommendations for indefinite PPI were given by GI. Hospital course complicated by fever of unknown origin, atrial fibrillation with rvr, and acute on chronic diastolic HF.      Subjective:    [name] [name] today feels better.  He had acute hypoxic and hypercapneic resp failure yesterday requiring bipap.  Remains on bipap,  Much more responsive this am.  Afebrile.  No headache, No chest pain, No abdominal pain - No Nausea, No new weakness tingling or numbness, No Cough -   Assessment  & Plan :    Principal Problem:   Acute respiratory failure with hypoxia (HCC) Active Problems:   GI bleed requiring more than 4 units of blood in 24 hours, ICU, or surgery   Melena   Antral ulcer, chronic   Acute blood loss anemia   Alcoholic cirrhosis of liver without ascites (HCC)   SOB (shortness of breath)   Fever   A-fib (HCC)   Acute diastolic CHF (congestive heart failure) (HCC)   Gastric ulcer: Per EGD 12/27/2016   Lytic lesion of bone on  x-ray: Left humeral   Acute encephalopathy   UGI  bleed    #1 acute respiratory failure with hypoxia/ hypercapnea Questionable etiology. Initially felt patient was likely in volume overload per chest x-ray findings and elevated BNP and patient also noted to be in A. fib with RVR. Improved clinically. Patient currently off the BiPAP with sats of 96% on 4 L nasal cannula. ABG with a normal pH with a elevated PCO2 and normal O2. On nasal cannula. Patient started on Mucinex DM, chest PT, Claritin, scheduled nebs, 3% saline nebulizer treatment with clinical improvement. Change IV Lasix to oral Lasix. pt again went into acute respiratory failure 12/28, CXR not suggestive of CHF.  Pt clinically thought to have Copd exacerbation.   2. Copd exacerbation Solumedrol=> prednisone '60mg'$  po qday this am Cont zithromax Cont albuterol neb q6h and q6h prn Cont spiriva 1puff qday  3. Probable acute on chronic diastolic CHF exacerbation Questionable etiology. Felt likely triggered by A. fib with RVR versus volume overload from packed red blood cell transfusions. Chest x-ray was noted to have cardiomegaly and mild pulmonary vascular congestion 12/30/2016. NP noted on 12/30/2016 to be elevated at 262.4. Patient off BiPAP. Patient on IV Lasix with a urine output of 0.700L over the past 24 hours. Current weight [name] 177 pounds from179 pounds from 181 pounds from 187 pounds from 201 pounds (01/02/2017) from a weight of 214 pounds on 12/29/2016. [name] on 08/12/2016 at cardiologist office was 207 pounds. Change IV Lasix back to home dose oral Lasix.  Will continue with oral lasix today.   4. Chronic atrial fibrillation Patient noted to be in A. fib with RVR and had to be placed on the Cardizem drip. Heart rate improved currently in the 90s. Patient off BiPAP. Discontinued Cardizem drip and resumed home regimen of Cardizem 120 mg daily. Anticoagulation on hold due  to recent GI bleed. Follow.  5. Acute upper GI bleed secondary to gastric ulcer Patient with no further tarry stools noted. Patient status post upper endoscopy which showed gastric ulcer which was negative for H. pylori and benign per biopsy. Upper endoscopy showed no signs of portal gastropathy or varices. Pathology suggested ulcerative gastritis. Status post 1 unit packed red blood cells. Hemoglobin stable at 8.2. Continue PPI. will switch to oral PPI today.  Outpatient follow-up with GI.  6. Acute blood loss anemia Secondary to problem #4. Status post transfusion 1 unit packed red blood cells. Continue PPI.  Repeat cbc in am  7. Acute encephalopathy probably secondary to hypercapnea Patientmore alert today after Librium dose was decreased yesterday.Patient noted to have gotten some Librium 25 mgthe night of 01/04/2017,after Librium detox protocol.Ammonia level at 20. TSH within normal limits. Thiamine levels pending. RPR nonreactive. HIV reactive. Patient currently off BiPAP. ABG noted and normal pH with a PCO2 in the 60s. CT head negative for any acute abnormalities. Follow. Repeat ABG pending this am, continue Bipap  8. Hypokalemia Secondary to diuresis. Magnesium level at 2.2. Repleted.  9. Fever resolved Questionable etiology. Currently afebrile. Urinalysis unremarkable. Repeat chest x-ray has prior chest x-ray worrisome for volume overload. Cultures from 12/27/2016 are negative. Blood cultures from 12/31/2016 with no growth to date. Urine cultures were negative. MRSA PCR negative. Fever curve trendeddown. Patient status post 1 week of IV antibiotics of vancomycin and Zosyn. No need for any further antibiotics at this time.   10. Lytic lesions in left humerus, left fibula and skull /?? Myeloma  Dedicated humeral films with lytic lesion. Skeletal survey with lytic lesions in the left  humerus, left fibular and skull. Concern for myeloma.  SPEP UPEP pending. Will likely need outpatient follow-up with hematology/oncology.   11. Alcoholic cirrhosis Ammonia levels within normal limits. Per upper endoscopy no signs of portal gastropathy or varices. Outpatient follow-up with GI.  12. Alcohol dependence Per family patient has been abstinent for close to 1-2 months. Patient noted to be agitated however slowly improving. Patient more alert today after Librium dose decreased yesterday.Continue thiamine, folate. Alcohol cessation. Ativan withdrawal protocol was discontinued and patient now on Librium detox protocol. DecreasedLibrium dose to 15 mg as needed and scheduled due to patient's increased drowsinesson 01/05/2017.    DVT prophylaxis:SCDs Code Status:DNR Family Communication:No family at bedside.  I spoke with wife and she desires home with home health, declines SNF Disposition Plan:wife and patient decline SNF, once medically stable and patient improved and at baseline.     Consultants:  Gastroenterology: Dr. Carlean Purl 12/27/2016  Procedures:  Chest x-ray 12/27/2016, 12/28/2016, 12/30/2016, 12/31/2016, 01/05/2017  2D echo 12/31/2016  Upper endoscopy 12/27/2016 per Dr. Carlean Purl, nonbleeding gastric ulcer noted  Transfused 1 unit packed red blood cells 12/29/2016  Right lower extremity Dopplers 01/03/2017--- negative for DVT, negative for Baker's cyst  Skeletal survey 01/03/2017  CT head 01/05/2017  Antimicrobials:   IV Zosyn 12/27/2016>>>> 12/28/2016  IV Zosyn 12/31/2016>>>>>> 01/04/2017  IV vancomycin 12/27/2016>>>>> 12/28/2016  zithromax 12/28=>       Lab Results  Component Value Date   PLT 195 01/08/2017      Anti-infectives (From admission, onward)   Start     Dose/Rate Route Frequency Ordered Stop   01/07/17 1400  azithromycin (ZITHROMAX) 500 mg in dextrose 5 % 250 mL IVPB     500 mg 250 mL/hr over 60 Minutes Intravenous Every 24 hours 01/07/17 1305     12/31/16  1000  piperacillin-tazobactam (ZOSYN) IVPB 3.375 g  Status:  Discontinued     3.375 g 12.5 mL/hr over 240 Minutes Intravenous Every 8 hours 12/31/16 0912 01/04/17 1036   12/31/16 1000  vancomycin (VANCOCIN) 1,250 mg in sodium chloride 0.9 % 250 mL IVPB  Status:  Discontinued     1,250 mg 166.7 mL/hr over 90 Minutes Intravenous Every 12 hours 12/31/16 0912 01/02/17 1102   12/27/16 1000  piperacillin-tazobactam (ZOSYN) IVPB 3.375 g  Status:  Discontinued     3.375 g 12.5 mL/hr over 240 Minutes Intravenous Every 8 hours 12/27/16 0920 12/28/16 0922   12/27/16 1000  vancomycin (VANCOCIN) 1,250 mg in sodium chloride 0.9 % 250 mL IVPB  Status:  Discontinued     1,250 mg 166.7 mL/hr over 90 Minutes Intravenous Every 12 hours 12/27/16 0921 12/28/16 0922   12/26/16 2100  cefTRIAXone (ROCEPHIN) 1 g in dextrose 5 % 50 mL IVPB  Status:  Discontinued     1 g 100 mL/hr over 30 Minutes Intravenous Every 24 hours 12/26/16 2004 12/27/16 0920        Objective:   Vitals:   01/08/17 0426 01/08/17 0438 01/08/17 0500 01/08/17 0600  BP:   (!) 141/62 (!) 144/74  Pulse: 81  80 82  Resp: _0 Temp:      TempSrc:      SpO2: 100%  99% 98%  Weight:  80.5 kg (177 lb 7.5 oz)    Height:        Wt Readings from Last 3 Encounters:  01/08/17 80.5 kg (177 lb 7.5 oz)  11/15/16 97 kg (213 lb 12.8 oz)  08/12/16 93.9 kg (207 lb)  Intake/Output Summary (Last 24 hours) at 01/08/2017 0614 Last data filed at 01/08/2017 0600 Gross per 24 hour  Intake 370 ml  Output 325 ml  Net 45 ml     Physical Exam  Awake Alert, Oriented X 3, No new F.N deficits, Normal affect [place].AT,PERRAL Supple Neck,No JVD, No cervical lymphadenopathy appriciated.  Symmetrical Chest wall movement, Good air movement bilaterally, slight crackle left lung base, minimal exp wheezing RRR,No Gallops,Rubs or new Murmurs, No Parasternal Heave +ve B.Sounds, Abd Soft, No tenderness, No organomegaly appriciated, No rebound - guarding or  rigidity. No Cyanosis, Clubbing or edema, No new Rash or bruise      Data Review:    CBC Recent Labs  Lab 01/04/17 0538 01/05/17 0624 01/06/17 0630 01/07/17 0724 01/08/17 0357  WBC 5.2 6.1 8.3 9.0 10.9*  HGB 7.6* 8.4* 8.2* 8.7* 9.3*  HCT 24.6* 27.9* 26.5* 28.7* 31.2*  PLT 134* 131* 117* 137* 195  MCV 92.8 94.9 92.3 94.1 93.4  MCH 28.7 28.6 28.6 28.5 27.8  MCHC 30.9 30.1 30.9 30.3 29.8*  RDW 17.3* 17.4* 17.0* 17.2* 16.8*    Chemistries  Recent Labs  Lab 01/02/17 0915 01/03/17 0450 01/04/17 0538 01/05/17 0624 01/06/17 0630 01/07/17 0724  NA 142 135 135 137 136 137  K 3.0* 3.0* 3.0* 3.5 3.9 3.8  CL 95* 90* 89* 92* 91* 92*  CO2 39* 37* 39* 38* 35* 36*  GLUCOSE 105* 116* 126* 128* 110* 125*  BUN _0 CREATININE 1.05 1.03 0.97 0.81 0.76 0.83  CALCIUM 8.3* 8.0* 7.9* 8.4* 8.2* 8.3*  MG 1.7 1.8 2.2  --   --   --   AST  --  23  --   --   --   --   ALT  --  13*  --   --   --   --   ALKPHOS  --  47  --   --   --   --   BILITOT  --  0.6  --   --   --   --    ------------------------------------------------------------------------------------------------------------------ No results for input(s): CHOL, HDL, LDLCALC, TRIG, CHOLHDL, LDLDIRECT in the last 72 hours.  No results found for: HGBA1C ------------------------------------------------------------------------------------------------------------------ No results for input(s): TSH, T4TOTAL, T3FREE, THYROIDAB in the last 72 hours.  Invalid input(s): FREET3 ------------------------------------------------------------------------------------------------------------------ No results for input(s): VITAMINB12, FOLATE, FERRITIN, TIBC, IRON, RETICCTPCT in the last 72 hours.  Coagulation profile No results for input(s): INR, PROTIME in the last 168 hours.  No results for input(s): DDIMER in the last 72 hours.  Cardiac Enzymes No results for input(s): CKMB, TROPONINI, MYOGLOBIN in the last 168  hours.  Invalid input(s): CK ------------------------------------------------------------------------------------------------------------------    Component Value Date/Time   BNP 262.4 (H) 12/30/2016 0908    Inpatient Medications  Scheduled Meds: . budesonide (PULMICORT) nebulizer solution  0.5 mg Nebulization BID  . dextromethorphan-guaiFENesin  2 tablet Oral BID  . diltiazem  120 mg Oral q morning - 10a  . feeding supplement (PRO-STAT SUGAR FREE 64)  30 mL Oral BID  . ferrous sulfate  325 mg Oral Daily  . fluticasone  2 spray Each Nare Daily  . furosemide  40 mg Oral BID  . ipratropium-albuterol  3 mL Nebulization QID  . loratadine  10 mg Oral Daily  . mouth rinse  15 mL Mouth Rinse BID  . methylPREDNISolone (SOLU-MEDROL) injection  80 mg Intravenous Q8H  . multivitamin with minerals  1 tablet Oral Daily  .  nicotine  21 mg Transdermal Daily  . pantoprazole  40 mg Oral BID  . potassium chloride  40 mEq Oral Daily  . tamsulosin  0.4 mg Oral QPC supper  . thiamine  100 mg Oral Daily   Continuous Infusions: . sodium chloride Stopped (12/29/16 1100)  . azithromycin Stopped (01/07/17 1549)   PRN Meds:.sodium chloride, acetaminophen, acetaminophen, ipratropium-albuterol, morphine injection  Micro Results Recent Results (from the past 240 hour(s))  Culture, blood (Routine X 2) w Reflex to ID Panel     Status: None   Collection Time: 12/31/16  9:28 AM  Result Value Ref Range Status   Specimen Description BLOOD RIGHT HAND  Final   Special Requests   Final    BOTTLES DRAWN AEROBIC AND ANAEROBIC Blood Culture adequate volume   Culture NO GROWTH 5 DAYS  Final   Report Status 01/05/2017 FINAL  Final  Culture, Urine     Status: None   Collection Time: 12/31/16  9:34 AM  Result Value Ref Range Status   Specimen Description URINE, RANDOM  Final   Special Requests NONE  Final   Culture NO GROWTH  Final   Report Status 01/01/2017 FINAL  Final  Culture, blood (Routine X 2) w Reflex  to ID Panel     Status: None   Collection Time: 12/31/16  9:35 AM  Result Value Ref Range Status   Specimen Description BLOOD LEFT HAND  Final   Special Requests   Final    BOTTLES DRAWN AEROBIC AND ANAEROBIC Blood Culture results may not be optimal due to an inadequate volume of blood received in culture bottles   Culture NO GROWTH 5 DAYS  Final   Report Status 01/05/2017 FINAL  Final    Radiology Reports Ct Head Wo Contrast  Result Date: 01/05/2017 CLINICAL DATA:  Altered level of consciousness. EXAM: CT HEAD WITHOUT CONTRAST TECHNIQUE: Contiguous axial images were obtained from the base of the skull through the vertex without intravenous contrast. COMPARISON:  None. FINDINGS: Brain: There [name] atrophy and chronic small vessel disease changes. No acute intracranial abnormality. Specifically, no hemorrhage, hydrocephalus, mass lesion, acute infarction, or significant intracranial injury. Vascular: No hyperdense vessel or unexpected calcification. Skull: No acute calvarial abnormality. Sinuses/Orbits: No acute finding. Other: None IMPRESSION: No acute intracranial abnormality. Atrophy, chronic microvascular disease. Electronically Signed   By: Rolm Baptise M.D.   On: 01/05/2017 11:35   Dg Chest Port 1 View  Result Date: 01/07/2017 CLINICAL DATA:  Respiratory Distress. On Bi-PAP. Hypoxia. Weakness. Unable to follow commands. EXAM: PORTABLE CHEST 1 VIEW COMPARISON:  01/05/2017 FINDINGS: Shallow lung inflation. Cardiomegaly. There are patchy opacities at the lung bases bilaterally, similar in appearance to the previous exam. No pulmonary edema. IMPRESSION: 1. Stable cardiomegaly. 2. Stable bibasilar opacities. Electronically Signed   By: Nolon Nations M.D.   On: 01/07/2017 13:55   Dg Chest Port 1 View  Result Date: 01/05/2017 CLINICAL DATA:  Shortness of breath and congestion. EXAM: PORTABLE CHEST 1 VIEW COMPARISON:  01/02/2017 FINDINGS: Stable top-normal heart size. Improved aeration at the  lung bases with less prominent bibasilar atelectasis. There [name] no evidence of pulmonary edema, consolidation, pneumothorax, nodule or pleural fluid. IMPRESSION: Improved aeration with less prominent bibasilar atelectasis. Electronically Signed   By: Aletta Edouard M.D.   On: 01/05/2017 14:59   Dg Chest Port 1 View  Result Date: 01/02/2017 CLINICAL DATA:  Shortness of breath. EXAM: PORTABLE CHEST 1 VIEW COMPARISON:  Radiograph of December 31, 2016. FINDINGS: Stable cardiomegaly. No  pneumothorax or pleural effusion [name] noted. Stable bibasilar subsegmental atelectasis or infiltrates are noted. Bony thorax [name] unremarkable. IMPRESSION: Stable bibasilar subsegmental atelectasis or infiltrates. Electronically Signed   By: Marijo Conception, M.D.   On: 01/02/2017 10:35   Dg Chest Port 1 View  Result Date: 12/31/2016 CLINICAL DATA:  Shortness of breath. EXAM: PORTABLE CHEST 1 VIEW COMPARISON:  Radiograph of December 30, 2016. FINDINGS: Stable cardiomegaly. No pneumothorax [name] noted. Mild bibasilar atelectasis or infiltrates are noted, right greater than left. No definite pleural effusion [name] noted. Bony thorax [name] unremarkable. IMPRESSION: Mild bibasilar subsegmental atelectasis or infiltrates are noted. Electronically Signed   By: Marijo Conception, M.D.   On: 12/31/2016 [name]:54   Dg Chest Port 1 View  Result Date: 12/30/2016 CLINICAL DATA:  Shortness of breath. EXAM: PORTABLE CHEST 1 VIEW COMPARISON:  12/28/2016 FINDINGS: Bilateral mild diffuse interstitial thickening. No focal consolidation, pleural effusion or pneumothorax. Stable cardiomegaly. No acute osseous abnormality. Lytic lesion in the proximal humeral diaphysis partially visualize measuring 2.7 x 1.1 cm of indeterminate etiology. IMPRESSION: 1. Cardiomegaly with mild pulmonary vascular congestion. 2. Lytic lesion in the proximal humeral diaphysis partially visualize measuring 2.7 x 1.1 cm of indeterminate etiology. Recommend dedicated x-rays of the left  humerus. Electronically Signed   By: Kathreen Devoid   On: 12/30/2016 [name]:50   Dg Chest Port 1 View  Result Date: 12/28/2016 CLINICAL DATA:  Acute respiratory failure. EXAM: PORTABLE CHEST 1 VIEW COMPARISON:  Radiograph of December 27, 2016. FINDINGS: Stable cardiomegaly. No pneumothorax [name] noted. Elevated left hemidiaphragm [name] noted. Stable central pulmonary vascular congestion [name] noted. Mildly increased interstitial densities are noted throughout the right lung concerning for pulmonary edema. Bony thorax [name] unremarkable. No significant pleural effusion [name] noted. IMPRESSION: Stable cardiomegaly and central pulmonary vascular congestion. Mildly increased interstitial densities are noted throughout the right lung concerning for pulmonary edema. Electronically Signed   By: Marijo Conception, M.D.   On: 12/28/2016 07:45   Dg Chest Port 1 View  Result Date: 12/27/2016 CLINICAL DATA:  76 year old [name] with hypoxia and shortness breath. Subsequent encounter. EXAM: PORTABLE CHEST 1 VIEW COMPARISON:  12/26/2016 and 06/09/2016 chest x-ray. FINDINGS: Cardiomegaly with pulmonary vascular congestion and Kerley B lines suggesting mild pulmonary edema. No pneumothorax. Calcified aorta. IMPRESSION: Cardiomegaly. Mild pulmonary edema. Aortic Atherosclerosis (ICD10-I70.0). Electronically Signed   By: Genia Del M.D.   On: 12/27/2016 07:16   Dg Bone Survey Met  Result Date: 01/03/2017 CLINICAL DATA:  Lytic lesion of the left humerus. EXAM: METASTATIC BONE SURVEY COMPARISON:  Radiographs dated 01/02/2017 and 12/30/2016 FINDINGS: Again noted [name] the lytic lesion in lateral aspect of the proximal left humeral shaft, measuring 3.6 x 1.0 cm on this exam. There [name] a 12 x 3 mm lucency in the distal fibular shaft. There [name] a subtle 9 mm lytic area in one of the temporal bones. No other lytic lesions are noted in the skeleton. Incidental note [name] made of diffuse degenerative disc disease in the lumbar spine. There [name] multilevel  degenerative disc and joint disease in the cervical spine. Aortic atherosclerosis. IMPRESSION: Lytic lesions in the left humerus, left fibula, and skull. The possibility of myeloma should be considered. Electronically Signed   By: Lorriane Shire M.D.   On: 01/03/2017 12:02   Dg Humerus Left  Result Date: 01/02/2017 CLINICAL DATA:  Lytic bone lesion on radiography EXAM: LEFT HUMERUS - 2+ VIEW COMPARISON:  None FINDINGS: Proximal to the left deltoid insertion site along the proximal  humeral diaphysis there [name] a 3.4 by 0.8 cm cortical lucent lesion. No overlying periosteal reaction. Chronic irregular appearing spurring along the common extensor tendon and common flexor tendon sites along the elbow. IMPRESSION: 1. Cortical proximal diaphyseal humeral lucency with fairly sharp zone of transition just proximal to the deltoid insertion site. Possibilities might include myeloma, metastatic disease, lymphoma, an old nonossifying fibroma. Correlate with any serologic indicators of myeloma and consider further skeletal workup or humerus MRI. Electronically Signed   By: Van Clines M.D.   On: 01/02/2017 12:42    Time Spent in minutes  30   Jani Gravel M.D on 01/08/2017 at 6:14 AM  Between 7am to 7pm - Pager - [phone]  After 7pm go to [email or web address] - password [facility]  [facility] -  Office  [phone]

## 2017-01-09 ENCOUNTER — Inpatient Hospital Stay (HOSPITAL_COMMUNITY): Payer: Medicare Other

## 2017-01-09 LAB — COMPREHENSIVE METABOLIC PANEL
ALK PHOS: 52 U/L (ref 38–126)
ALT: 13 U/L — ABNORMAL LOW (ref 17–63)
ANION GAP: 8 (ref 5–15)
AST: 12 U/L — ABNORMAL LOW (ref 15–41)
Albumin: 2.1 g/dL — ABNORMAL LOW (ref 3.5–5.0)
BILIRUBIN TOTAL: 0.4 mg/dL (ref 0.3–1.2)
BUN: 23 mg/dL — AB (ref 6–20)
CHLORIDE: 97 mmol/L — AB (ref 101–111)
CO2: 33 mmol/L — AB (ref 22–32)
CREATININE: 1.05 mg/dL (ref 0.61–1.24)
Calcium: 8.7 mg/dL — ABNORMAL LOW (ref 8.9–10.3)
GLUCOSE: 198 mg/dL — AB (ref 65–99)
POTASSIUM: 4.2 mmol/L (ref 3.5–5.1)
SODIUM: 138 mmol/L (ref 135–145)
Total Protein: 5.8 g/dL — ABNORMAL LOW (ref 6.5–8.1)

## 2017-01-09 LAB — CBC
HEMATOCRIT: 26.7 % — AB (ref 39.0–52.0)
HEMOGLOBIN: 8.3 g/dL — AB (ref 13.0–17.0)
MCH: 28.6 pg (ref 26.0–34.0)
MCHC: 31.1 g/dL (ref 30.0–36.0)
MCV: 92.1 fL (ref 78.0–100.0)
Platelets: 215 10*3/uL (ref 150–400)
RBC: 2.9 MIL/uL — ABNORMAL LOW (ref 4.22–5.81)
RDW: 16.8 % — ABNORMAL HIGH (ref 11.5–15.5)
WBC: 13.4 10*3/uL — ABNORMAL HIGH (ref 4.0–10.5)

## 2017-01-09 MED ORDER — PREDNISONE 50 MG PO TABS
50.0000 mg | ORAL_TABLET | Freq: Every day | ORAL | Status: DC
  Administered 2017-01-09 – 2017-01-13 (×5): 50 mg via ORAL
  Filled 2017-01-09 (×5): qty 1

## 2017-01-09 NOTE — Progress Notes (Addendum)
Patient ID: DELMA VILLALVA, male   DOB: [birth year], 76 y.o.   MRN: [id number]                                                                PROGRESS NOTE                                                                                                                                                                                                             Patient Demographics:    Grant Swager, is a 76 y.o. male, DOB - 01-Dec-1940, ZJI:[id number]  Admit date - 12/26/2016   Admitting Physician Jamesetta So, MD  Outpatient Primary MD for the patient is Park, Barry Dienes, MD  LOS - 14  Outpatient Specialists:     No chief complaint on file.      Brief Narrative    76 y.o. W M with a hx of significant ETOH abuse; per the family ~ 4 drinks of [name] per day and occasionally several bottles a day, who presented to [facility] with weakness, exertional dyspnea And melanotic stools for several days.  Pt was found to have gastric ulcer (non H pylori) and recommendations for indefinite PPI were given by GI. Hospital course complicated by fever of unknown origin, atrial fibrillation with rvr, and acute on chronic diastolic HF.  On 12/28, pt had episode of hypoxia and required Bipap for acute hypoxic and hypercapneic respiratory failure.  CXR negative. Pt was transferred to stepdown.  Pt is currently being assessed for aspiration by speech therapy.     Subjective:    Roark Rufo today is more alert, but nursing staff believes that he might be at risk for aspiration.  Breathing seems ok and he is more alert this am, axox3  No headache, No chest pain, No abdominal pain - No Nausea, No new weakness tingling or numbness, No Cough - SOB.   Assessment  & Plan :    Principal Problem:   Acute respiratory failure with hypoxia (HCC) Active Problems:   GI bleed requiring more than 4 units of blood in 24 hours, ICU, or surgery   Melena   Antral ulcer, chronic   Acute blood loss anemia  Alcoholic cirrhosis of liver without ascites (HCC)   SOB (shortness of breath)   Fever  A-fib ([place])   Acute diastolic CHF (congestive heart failure) ([place])   Gastric ulcer: Per EGD 12/27/2016   Lytic lesion of bone on x-ray: Left humeral   Acute encephalopathy   UGI bleed   1.    Acute respiratory failure with hypoxia/ hypercapnea Questionable etiology. Initially felt patient was likely in volume overload per chest x-ray findings and elevated BNP and patient also noted to be in A. fib with RVR. Improved clinically. Patient currently off the BiPAP with sats of 96% on 4 L nasal cannula. ABG with a normal pH with a elevated PCO2 and normal O2. On nasal cannula. Patient started on Mucinex DM, chest PT, Claritin, scheduled nebs, 3% saline nebulizer treatment with clinical improvement. Change IV Lasix to oral Lasix. pt again went into acute respiratory failure 12/28, CXR not suggestive of CHF.  Pt clinically thought to have Copd exacerbation.   ? Aspiration Speech therapy to evaluate and tx   Copd exacerbation Solumedrol=> prednisone 42m po qday  (12/29), Decrease prednisone to 525mpo qday Cont zithromax 12/28=> Cont Duoneb Continue Bipap for undiagnosed OSA at [facility]  Probable acute on chronic diastolic CHF exacerbation Questionable etiology. Felt likely triggered by A. fib with RVR versus volume overload from packed red blood cell transfusions. Chest x-ray was noted to have cardiomegaly and mild pulmonary vascular congestion 12/30/2016. NP noted on 12/30/2016 to be elevated at 262.4. Patient off BiPAP. Patient on IV Lasix with a urine output of 0.700L over the past 24 hours. Current weight is 177 pounds from179 pounds from 181 pounds from 187 pounds from 201 pounds (01/02/2017) from a weight of 214 pounds on 12/29/2016. [name] on 08/12/2016 at cardiologist office was 207 pounds. Change IV Lasix back to home dose oral Lasix.Will  continue with oral lasix today.  Chronic atrial fibrillation Patient noted to be in A. fib with RVR and had to be placed on the Cardizem drip. Heart rate improved currently in the 90s. Patient off BiPAP. Discontinued Cardizem drip and resumed home regimen of Cardizem 120 mg daily. Anticoagulation on hold due to recent GI bleed. Follow.  Acute upper GI bleed secondary to gastric ulcer Patient with no further tarry stools noted. Patient status post upper endoscopy which showed gastric ulcer which was negative for H. pylori and benign per biopsy. Upper endoscopy showed no signs of portal gastropathy or varices. Pathology suggested ulcerative gastritis. Status post 1 unit packed red blood cells. Hemoglobin stable at 8.2. Continue PPI.will switch to oral PPI today.Outpatient follow-up with GI.  Acute blood loss anemia Secondary to problem #4. Status post transfusion 1 unit packed red blood cells. Continue PPI.  Repeat cbc in am  Acute encephalopathy probably secondary to hypercapnea Patientmore alert today after Librium dose was decreased yesterday.Patient noted to have gotten some Librium 25 mgthe night of 01/04/2017,after Librium detox protocol.Ammonia level at 20. TSH within normal limits. Thiamine levels pending. RPR nonreactive. HIV reactive. Patient currently off BiPAP. ABG noted and normal pH with a PCO2 in the 60s. CT head negative for any acute abnormalities. Follow. Repeat ABG pending this am, continue Bipap  Hypokalemia Secondary to diuresis. Magnesium level at 2.2. Repleted.  Fever resolved Questionable etiology. Currently afebrile. Urinalysis unremarkable. Repeat chest x-ray has prior chest x-ray worrisome for volume overload. Cultures from 12/27/2016 are negative. Blood cultures from 12/31/2016 with no growth to date. Urine cultures were negative. MRSA PCR negative. Fever curve trendeddown. Patient status post 1 week of IV antibiotics of  vancomycin and  Zosyn.  Continue zithromax for Copd exacerbation for now  Lytic lesions in left humerus, left fibula and skull /?? Myeloma  Dedicated humeral films with lytic lesion. Skeletal survey with lytic lesions in the left humerus, left fibular and skull. Concern for myeloma. SPEP UPEP pending. Will likely need outpatient follow-up with hematology/oncology.   11. Alcoholic cirrhosis Ammonia levels within normal limits. Per upper endoscopy no signs of portal gastropathy or varices. Outpatient follow-up with GI.  12. Alcohol dependence Per family patient has been abstinent for close to 1-2 months. Patient noted to be agitated however slowly improving. Patient more alert today after Librium dose decreased yesterday.Continue thiamine, folate. Alcohol cessation. Ativan withdrawal protocol was discontinued and patient now on Librium detox protocol. DecreasedLibrium dose to 15 mg as needed and scheduled due to patient's increased drowsinesson 01/05/2017.    DVT prophylaxis:SCDs Code Status:DNR Family Communication:No family at bedside.I spoke with wife and she desires home with home health, declines SNF Disposition Plan:wife and patient declineSNF, once medically stable and patient improved and at baseline.    Consultants:  Gastroenterology: Dr. Carlean Purl 12/27/2016  Procedures:  Chest x-ray 12/27/2016, 12/28/2016, 12/30/2016, 12/31/2016, 01/05/2017  2D echo 12/31/2016  Upper endoscopy 12/27/2016 per Dr. Carlean Purl, nonbleeding gastric ulcer noted  Transfused 1 unit packed red blood cells 12/29/2016  Right lower extremity Dopplers 01/03/2017--- negative for DVT, negative for Baker's cyst  Skeletal survey 01/03/2017  CT head 01/05/2017  Antimicrobials:   IV Zosyn 12/27/2016>>>> 12/28/2016  IV Zosyn 12/31/2016>>>>>> 01/04/2017  IV vancomycin 12/27/2016>>>>> 12/28/2016  zithromax 12/28=>    Lab Results  Component Value Date   PLT 215  01/09/2017     Anti-infectives (From admission, onward)   Start     Dose/Rate Route Frequency Ordered Stop   01/07/17 1400  azithromycin (ZITHROMAX) 500 mg in dextrose 5 % 250 mL IVPB     500 mg 250 mL/hr over 60 Minutes Intravenous Every 24 hours 01/07/17 1305     12/31/16 1000  piperacillin-tazobactam (ZOSYN) IVPB 3.375 g  Status:  Discontinued     3.375 g 12.5 mL/hr over 240 Minutes Intravenous Every 8 hours 12/31/16 0912 01/04/17 1036   12/31/16 1000  vancomycin (VANCOCIN) 1,250 mg in sodium chloride 0.9 % 250 mL IVPB  Status:  Discontinued     1,250 mg 166.7 mL/hr over 90 Minutes Intravenous Every 12 hours 12/31/16 0912 01/02/17 1102   12/27/16 1000  piperacillin-tazobactam (ZOSYN) IVPB 3.375 g  Status:  Discontinued     3.375 g 12.5 mL/hr over 240 Minutes Intravenous Every 8 hours 12/27/16 0920 12/28/16 0922   12/27/16 1000  vancomycin (VANCOCIN) 1,250 mg in sodium chloride 0.9 % 250 mL IVPB  Status:  Discontinued     1,250 mg 166.7 mL/hr over 90 Minutes Intravenous Every 12 hours 12/27/16 0921 12/28/16 0922   12/26/16 2100  cefTRIAXone (ROCEPHIN) 1 g in dextrose 5 % 50 mL IVPB  Status:  Discontinued     1 g 100 mL/hr over 30 Minutes Intravenous Every 24 hours 12/26/16 2004 12/27/16 0920        Objective:   Vitals:   01/09/17 0800 01/09/17 0900 01/09/17 1000 01/09/17 1100  BP: (!) 147/88 137/66 (!) 148/71   Pulse: 81 92 86   Resp: 19 19 20    Temp:    98.2 F (36.8 C)  TempSrc:    Oral  SpO2: 99% (!) 89% 92%   Weight:      Height:        Wt Readings from  Last 3 Encounters:  01/09/17 79.5 kg (175 lb 4.3 oz)  11/15/16 97 kg (213 lb 12.8 oz)  08/12/16 93.9 kg (207 lb)     Intake/Output Summary (Last 24 hours) at 01/09/2017 1237 Last data filed at 01/09/2017 0600 Gross per 24 hour  Intake 250 ml  Output 300 ml  Net -50 ml     Physical Exam  Awake Alert, Oriented X 3, No new F.N deficits, Normal affect [place].AT,PERRAL Supple Neck,No JVD, No cervical  lymphadenopathy appriciated.  Symmetrical Chest wall movement, Good air movement bilaterally, CTAB RRR,No Gallops,Rubs or new Murmurs, No Parasternal Heave +ve B.Sounds, Abd Soft, No tenderness, No organomegaly appriciated, No rebound - guarding or rigidity. No Cyanosis, Clubbing or edema, No new Rash or bruise      Data Review:    CBC Recent Labs  Lab 01/05/17 0624 01/06/17 0630 01/07/17 0724 01/08/17 0357 01/09/17 0329  WBC 6.1 8.3 9.0 10.9* 13.4*  HGB 8.4* 8.2* 8.7* 9.3* 8.3*  HCT 27.9* 26.5* 28.7* 31.2* 26.7*  PLT 131* 117* 137* 195 215  MCV 94.9 92.3 94.1 93.4 92.1  MCH 28.6 28.6 28.5 27.8 28.6  MCHC 30.1 30.9 30.3 29.8* 31.1  RDW 17.4* 17.0* 17.2* 16.8* 16.8*    Chemistries  Recent Labs  Lab 01/03/17 0450 01/04/17 0538 01/05/17 0624 01/06/17 0630 01/07/17 0724 01/08/17 0357 01/09/17 0329  NA 135 135 137 136 137 136 138  K 3.0* 3.0* 3.5 3.9 3.8 4.8 4.2  CL 90* 89* 92* 91* 92* 93* 97*  CO2 37* 39* 38* 35* 36* 34* 33*  GLUCOSE 116* 126* 128* 110* 125* 191* 198*  BUN 17 12 11 10 15  21* 23*  CREATININE 1.03 0.97 0.81 0.76 0.83 0.97 1.05  CALCIUM 8.0* 7.9* 8.4* 8.2* 8.3* 8.7* 8.7*  MG 1.8 2.2  --   --   --   --   --   AST 23  --   --   --   --  13* 12*  ALT 13*  --   --   --   --  12* 13*  ALKPHOS 47  --   --   --   --  58 52  BILITOT 0.6  --   --   --   --  0.7 0.4   ------------------------------------------------------------------------------------------------------------------ No results for input(s): CHOL, HDL, LDLCALC, TRIG, CHOLHDL, LDLDIRECT in the last 72 hours.  No results found for: HGBA1C ------------------------------------------------------------------------------------------------------------------ No results for input(s): TSH, T4TOTAL, T3FREE, THYROIDAB in the last 72 hours.  Invalid input(s): FREET3 ------------------------------------------------------------------------------------------------------------------ No results for input(s):  VITAMINB12, FOLATE, FERRITIN, TIBC, IRON, RETICCTPCT in the last 72 hours.  Coagulation profile No results for input(s): INR, PROTIME in the last 168 hours.  No results for input(s): DDIMER in the last 72 hours.  Cardiac Enzymes No results for input(s): CKMB, TROPONINI, MYOGLOBIN in the last 168 hours.  Invalid input(s): CK ------------------------------------------------------------------------------------------------------------------    Component Value Date/Time   BNP 262.4 (H) 12/30/2016 0908    Inpatient Medications  Scheduled Meds: . budesonide (PULMICORT) nebulizer solution  0.5 mg Nebulization BID  . dextromethorphan-guaiFENesin  2 tablet Oral BID  . diltiazem  120 mg Oral q morning - 10a  . feeding supplement (PRO-STAT SUGAR FREE 64)  30 mL Oral BID  . ferrous sulfate  325 mg Oral Daily  . fluticasone  2 spray Each Nare Daily  . furosemide  40 mg Oral BID  . ipratropium-albuterol  3 mL Nebulization TID  . loratadine  10 mg Oral  Daily  . mouth rinse  15 mL Mouth Rinse BID  . mouth rinse  15 mL Mouth Rinse BID  . multivitamin with minerals  1 tablet Oral Daily  . nicotine  21 mg Transdermal Daily  . pantoprazole  40 mg Oral BID  . potassium chloride  40 mEq Oral Daily  . predniSONE  60 mg Oral Q breakfast  . tamsulosin  0.4 mg Oral QPC supper  . thiamine  100 mg Oral Daily   Continuous Infusions: . sodium chloride Stopped (12/29/16 1100)  . azithromycin Stopped (01/08/17 1620)   PRN Meds:.sodium chloride, acetaminophen, acetaminophen, ipratropium-albuterol, morphine injection  Micro Results Recent Results (from the past 240 hour(s))  Culture, blood (Routine X 2) w Reflex to ID Panel     Status: None   Collection Time: 12/31/16  9:28 AM  Result Value Ref Range Status   Specimen Description BLOOD RIGHT HAND  Final   Special Requests   Final    BOTTLES DRAWN AEROBIC AND ANAEROBIC Blood Culture adequate volume   Culture NO GROWTH 5 DAYS  Final   Report Status  01/05/2017 FINAL  Final  Culture, Urine     Status: None   Collection Time: 12/31/16  9:34 AM  Result Value Ref Range Status   Specimen Description URINE, RANDOM  Final   Special Requests NONE  Final   Culture NO GROWTH  Final   Report Status 01/01/2017 FINAL  Final  Culture, blood (Routine X 2) w Reflex to ID Panel     Status: None   Collection Time: 12/31/16  9:35 AM  Result Value Ref Range Status   Specimen Description BLOOD LEFT HAND  Final   Special Requests   Final    BOTTLES DRAWN AEROBIC AND ANAEROBIC Blood Culture results may not be optimal due to an inadequate volume of blood received in culture bottles   Culture NO GROWTH 5 DAYS  Final   Report Status 01/05/2017 FINAL  Final    Radiology Reports Ct Head Wo Contrast  Result Date: 01/05/2017 CLINICAL DATA:  Altered level of consciousness. EXAM: CT HEAD WITHOUT CONTRAST TECHNIQUE: Contiguous axial images were obtained from the base of the skull through the vertex without intravenous contrast. COMPARISON:  None. FINDINGS: Brain: There is atrophy and chronic small vessel disease changes. No acute intracranial abnormality. Specifically, no hemorrhage, hydrocephalus, mass lesion, acute infarction, or significant intracranial injury. Vascular: No hyperdense vessel or unexpected calcification. Skull: No acute calvarial abnormality. Sinuses/Orbits: No acute finding. Other: None IMPRESSION: No acute intracranial abnormality. Atrophy, chronic microvascular disease. Electronically Signed   By: Rolm Baptise M.D.   On: 01/05/2017 11:35   Dg Chest Port 1 View  Result Date: 01/07/2017 CLINICAL DATA:  Respiratory Distress. On Bi-PAP. Hypoxia. Weakness. Unable to follow commands. EXAM: PORTABLE CHEST 1 VIEW COMPARISON:  01/05/2017 FINDINGS: Shallow lung inflation. Cardiomegaly. There are patchy opacities at the lung bases bilaterally, similar in appearance to the previous exam. No pulmonary edema. IMPRESSION: 1. Stable cardiomegaly. 2. Stable  bibasilar opacities. Electronically Signed   By: Nolon Nations M.D.   On: 01/07/2017 13:55   Dg Chest Port 1 View  Result Date: 01/05/2017 CLINICAL DATA:  Shortness of breath and congestion. EXAM: PORTABLE CHEST 1 VIEW COMPARISON:  01/02/2017 FINDINGS: Stable top-normal heart size. Improved aeration at the lung bases with less prominent bibasilar atelectasis. There is no evidence of pulmonary edema, consolidation, pneumothorax, nodule or pleural fluid. IMPRESSION: Improved aeration with less prominent bibasilar atelectasis. Electronically Signed   By:  Aletta Edouard M.D.   On: 01/05/2017 14:59   Dg Chest Port 1 View  Result Date: 01/02/2017 CLINICAL DATA:  Shortness of breath. EXAM: PORTABLE CHEST 1 VIEW COMPARISON:  Radiograph of December 31, 2016. FINDINGS: Stable cardiomegaly. No pneumothorax or pleural effusion is noted. Stable bibasilar subsegmental atelectasis or infiltrates are noted. Bony thorax is unremarkable. IMPRESSION: Stable bibasilar subsegmental atelectasis or infiltrates. Electronically Signed   By: Marijo Conception, M.D.   On: 01/02/2017 10:35   Dg Chest Port 1 View  Result Date: 12/31/2016 CLINICAL DATA:  Shortness of breath. EXAM: PORTABLE CHEST 1 VIEW COMPARISON:  Radiograph of December 30, 2016. FINDINGS: Stable cardiomegaly. No pneumothorax is noted. Mild bibasilar atelectasis or infiltrates are noted, right greater than left. No definite pleural effusion is noted. Bony thorax is unremarkable. IMPRESSION: Mild bibasilar subsegmental atelectasis or infiltrates are noted. Electronically Signed   By: Marijo Conception, M.D.   On: 12/31/2016 09:54   Dg Chest Port 1 View  Result Date: 12/30/2016 CLINICAL DATA:  Shortness of breath. EXAM: PORTABLE CHEST 1 VIEW COMPARISON:  12/28/2016 FINDINGS: Bilateral mild diffuse interstitial thickening. No focal consolidation, pleural effusion or pneumothorax. Stable cardiomegaly. No acute osseous abnormality. Lytic lesion in the proximal  humeral diaphysis partially visualize measuring 2.7 x 1.1 cm of indeterminate etiology. IMPRESSION: 1. Cardiomegaly with mild pulmonary vascular congestion. 2. Lytic lesion in the proximal humeral diaphysis partially visualize measuring 2.7 x 1.1 cm of indeterminate etiology. Recommend dedicated x-rays of the left humerus. Electronically Signed   By: Kathreen Devoid   On: 12/30/2016 09:50   Dg Chest Port 1 View  Result Date: 12/28/2016 CLINICAL DATA:  Acute respiratory failure. EXAM: PORTABLE CHEST 1 VIEW COMPARISON:  Radiograph of December 27, 2016. FINDINGS: Stable cardiomegaly. No pneumothorax is noted. Elevated left hemidiaphragm is noted. Stable central pulmonary vascular congestion is noted. Mildly increased interstitial densities are noted throughout the right lung concerning for pulmonary edema. Bony thorax is unremarkable. No significant pleural effusion is noted. IMPRESSION: Stable cardiomegaly and central pulmonary vascular congestion. Mildly increased interstitial densities are noted throughout the right lung concerning for pulmonary edema. Electronically Signed   By: Marijo Conception, M.D.   On: 12/28/2016 07:45   Dg Chest Port 1 View  Result Date: 12/27/2016 CLINICAL DATA:  76 year old male with hypoxia and shortness breath. Subsequent encounter. EXAM: PORTABLE CHEST 1 VIEW COMPARISON:  12/26/2016 and 06/09/2016 chest x-ray. FINDINGS: Cardiomegaly with pulmonary vascular congestion and Kerley B lines suggesting mild pulmonary edema. No pneumothorax. Calcified aorta. IMPRESSION: Cardiomegaly. Mild pulmonary edema. Aortic Atherosclerosis (ICD10-I70.0). Electronically Signed   By: Genia Del M.D.   On: 12/27/2016 07:16   Dg Bone Survey Met  Result Date: 01/03/2017 CLINICAL DATA:  Lytic lesion of the left humerus. EXAM: METASTATIC BONE SURVEY COMPARISON:  Radiographs dated 01/02/2017 and 12/30/2016 FINDINGS: Again noted is the lytic lesion in lateral aspect of the proximal left humeral shaft,  measuring 3.6 x 1.0 cm on this exam. There is a 12 x 3 mm lucency in the distal fibular shaft. There is a subtle 9 mm lytic area in one of the temporal bones. No other lytic lesions are noted in the skeleton. 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 There is multilevel degenerative disc and joint disease in the cervical spine. Aortic atherosclerosis. IMPRESSION: Lytic lesions in the left humerus, left fibula, and skull. The possibility of myeloma should be considered. Electronically Signed   By: Lorriane Shire M.D.  On: 01/03/2017 12:02   Dg Humerus Left  Result Date: 01/02/2017 CLINICAL DATA:  Lytic bone lesion on radiography EXAM: LEFT HUMERUS - 2+ VIEW COMPARISON:  None FINDINGS: Proximal to the left deltoid insertion site along the proximal humeral diaphysis there is a 3.4 by 0.8 cm cortical lucent lesion. No overlying periosteal reaction. Chronic irregular appearing spurring along the common extensor tendon and common flexor tendon sites along the elbow. IMPRESSION: 1. Cortical proximal diaphyseal humeral lucency with fairly sharp zone of transition just proximal to the deltoid insertion site. Possibilities might include myeloma, metastatic disease, lymphoma, an old nonossifying fibroma. Correlate with any serologic indicators of myeloma and consider further skeletal workup or humerus MRI. Electronically Signed   By: Van Clines M.D.   On: 01/02/2017 12:42    Time Spent in minutes  30   Jani Gravel M.D on 01/09/2017 at 12:37 PM  Between 7am to 7pm - Pager - [phone]   After 7pm go to [email or web address] - password [email or web address]  [facility] -  Office  [phone]

## 2017-01-09 NOTE — Progress Notes (Signed)
Rt placed pt on bipap per [facility]HS order and pt tolerating well. Rt to cont to monitor,

## 2017-01-09 NOTE — Progress Notes (Signed)
SLP Cancellation Note  Patient Details Name: [name] GenerousJerry F Blasdell MRN: [id number] DOB: 10-16-1940   Cancelled treatment:       Reason Eval/Treat Not Completed: Other (comment). Swallow orders received. Pt seen by SLP during this admission and s/o 12/27. Spoke with RN, who reports pt coughing, choking with thin liquids even when fully alert. Given concerns, will proceed with MBS to r/o aspiration. Will update recommendations after completion of instrumental exam.   Rondel BatonMary Beth Hanna Ra, MS, CCC-SLP Speech-Language Pathologist [phone]   Arlana LindauMary E Jessilynn Taft 01/09/2017, 8:53 AM

## 2017-01-09 NOTE — Progress Notes (Signed)
Modified Barium Swallow Progress Note  Patient Details  Name: [name] GenerousJerry F Dewald MRN: [id number] Date of Birth: January 02, 1941  Today's Date: 01/09/2017  Modified Barium Swallow completed.  Full report located under Chart Review in the Imaging Section.  Brief recommendations include the following:  Clinical Impression  Patient presents with moderate risk for aspiration and mild-moderate pharyngoesophageal dysphagia with suspected primary esophageal component. There was intermittent laryngeal penetration during the swallow of all liquid consistencies (thin, nectar and honey), though no frank aspiration observed pt is at risk for aspiration given consistent penetration. During the exam, he had difficulty following commands to cough or clear throat for airway clearance, though when he did follow this command once it was effective. Penetration occurs primarily due to decreased laryngeal closure, though it also occurs with esophageal backflow which spills from the pyriforms during subsequent swallows. A sweep to the esophagus revealed slow clearance and to and fro movements of contrast. MBS does not diagnose dysphagia below the level of the UES, but I suspect esophageal pressures could be impacting pharyngeal phase. Following the exam, I spoke with pt and his sons, and pt's wife via phone to educate re: aspiration risks and possible treatment options. Pt was very clear in verbalizing his wish to eat and drink by mouth, accepting risks for aspiration. Wife and sons are also in agreement that pt would not want a feeding tube. Pt's respiratory status has been improving. I recommend regular diet with thin liquids via single sips, with cough or throat clear after swallow to clear laryngeal vestibule. In follow-up pt was able to follow commands consistently for slow rate and hard cough after swallow. Pt and family verbalized understanding of risks for aspiration, importance of upright posture and slow rate, oral care  before and after meals to minimize risks. Will follow up briefly for education.    Swallow Evaluation Recommendations       SLP Diet Recommendations: Regular solids;Thin liquid   Liquid Administration via: Cup;Straw   Medication Administration: Whole meds with puree   Supervision: Full supervision/cueing for compensatory strategies   Compensations: Slow rate;Small sips/bites;Hard cough after swallow   Postural Changes: Remain semi-upright after after feeds/meals (Comment);Seated upright at 90 degrees   Oral Care Recommendations: Oral care before and after PO   Other Recommendations: Clarify dietary restrictions   Rondel BatonMary Beth Loralai Eisman, MS, CCC-SLP Speech-Language Pathologist [phone]  Arlana LindauMary E Damarcus Reggio 01/09/2017,2:12 PM

## 2017-01-10 DIAGNOSIS — J9602 Acute respiratory failure with hypercapnia: Secondary | ICD-10-CM

## 2017-01-10 DIAGNOSIS — R0902 Hypoxemia: Secondary | ICD-10-CM

## 2017-01-10 LAB — BLOOD GAS, ARTERIAL
Acid-Base Excess: 10.9 mmol/L — ABNORMAL HIGH (ref 0.0–2.0)
Bicarbonate: 36.1 mmol/L — ABNORMAL HIGH (ref 20.0–28.0)
DELIVERY SYSTEMS: POSITIVE
DRAWN BY: 511551
Expiratory PAP: 5
FIO2: 0.5
Inspiratory PAP: 18
LHR: 10 {breaths}/min
O2 Saturation: 95.6 %
PATIENT TEMPERATURE: 98.6
PCO2 ART: 59.4 mmHg — AB (ref 32.0–48.0)
pH, Arterial: 7.401 (ref 7.350–7.450)
pO2, Arterial: 79.6 mmHg — ABNORMAL LOW (ref 83.0–108.0)

## 2017-01-10 NOTE — Progress Notes (Signed)
Physical Therapy Treatment Patient Details Name: [name] [name] MRN: [id number] DOB: 23-Dec-1940 Today's Date: 01/10/2017    History of Present Illness Pt is a 76 y.o. who [facility] with weakness, exertional dyspnea, and melanotic stools and was [facility]. Pt with multiple hospitalizations in the month prior to presentation. He has a history of ETOH abuse. Pt found to have gastric ulcer and hospital course complicated by fever of unknown origin, atrial fibrillation with RVR, and acute on chronic diastolic heart failure. Pt also with lytic lesions in L humeruse, L fibula, and skull. Pt transferred to ICU on 12/28 with respiratory failure and required bipap.    PT Comments    Pt continues to be weak and be dependent with mobility. Used lift to get OOB to recliner. Son arrived after treatment. He confirms that the family plans to take the pt home. Reports they are going to have someone come in and assist. He reports pt had similar presentation after [facility] and that pt did much better in his home enviroment. Son verbalized understanding that pt currently requires assist with all mobility.   Follow Up Recommendations  SNF;Supervision/Assistance - 24 hour(Pt and family refusing SNF so [facility])     Equipment Recommendations  Wheelchair (measurements PT)    Recommendations for Other Services       Precautions / Restrictions Precautions Precautions: Fall Restrictions Weight Bearing Restrictions: No    Mobility  Bed Mobility Overal bed mobility: Needs Assistance Bed Mobility: Supine to Sit;Sit to Supine;Rolling Rolling: Mod assist   Supine to sit: Total assist Sit to supine: Total assist   General bed mobility comments: Pt able to assist with rolling by reaching across with arm and using bed rail. For other bed mobility requires assist with all aspects  Transfers Overall transfer level: Needs  assistance               General transfer comment: Pt wouldn't attempt standing from EOB  Ambulation/Gait             General Gait Details: Unable   Stairs            Wheelchair Mobility    Modified Rankin (Stroke Patients Only)       Balance Overall balance assessment: Needs assistance Sitting-balance support: Feet supported;Bilateral upper extremity supported Sitting balance-Leahy Scale: Poor Sitting balance - Comments: Pt sat EOB x 15 minutes with min to mod assist. Pt propped on lt elbow for the majority of that time. Pt with kyphotic posture and unable to extend trunk and neck. Postural control: Left lateral lean                                  Cognition Arousal/Alertness: Awake/alert Behavior During Therapy: WFL for tasks assessed/performed Overall Cognitive Status: Impaired/Different from baseline Area of Impairment: Orientation;Attention;Memory;Following commands;Safety/judgement;Problem solving;Awareness                 Orientation Level: Disoriented to;Time;Situation Current Attention Level: Selective Memory: Decreased recall of precautions;Decreased short-term memory Following Commands: Follows one step commands with increased time;Follows one step commands consistently Safety/Judgement: Decreased awareness of safety;Decreased awareness of deficits Awareness: Intellectual Problem Solving: Slow processing;Decreased initiation;Difficulty sequencing;Requires verbal cues;Requires tactile cues        Exercises      General Comments        Pertinent Vitals/Pain Pain Assessment: No/denies pain  Home Living                      Prior Function            PT Goals (current goals can now be found in the care plan section) Progress towards PT goals: Not progressing toward goals - comment    Frequency    Min 3X/week      PT Plan Current plan remains appropriate    Co-evaluation               AM-PAC PT "6 Clicks" Daily Activity  Outcome Measure  Difficulty turning over in bed (including adjusting bedclothes, sheets and blankets)?: Unable Difficulty moving from lying on back to sitting on the side of the bed? : Unable Difficulty sitting down on and standing up from a chair with arms (e.g., wheelchair, bedside commode, etc,.)?: Unable Help needed moving to and from a bed to chair (including a wheelchair)?: Total Help needed walking in hospital room?: Total Help needed climbing 3-5 steps with a railing? : Total 6 Click Score: 6    End of Session Equipment Utilized During Treatment: Oxygen;Gait belt Activity Tolerance: Patient limited by fatigue Patient left: with call bell/phone within reach;in chair;with chair alarm set;with family/visitor present Nurse Communication: Mobility status;Need for lift equipment PT Visit Diagnosis: Muscle weakness (generalized) (M62.81);Other abnormalities of gait and mobility (R26.89);Difficulty in walking, not elsewhere classified (R26.2)     Time: [id number] PT Time Calculation (min) (ACUTE ONLY): 73 min  Charges:  $Therapeutic Activity: 68-82 mins                    G Codes:       [facility] Kishan [place] PT [phone]    Angelina OkCary W [facility] 01/10/2017, 11:01 AM

## 2017-01-10 NOTE — Care Management Note (Signed)
Case Management Note Original Note Created Cherylann ParrClaxton, Samantha S, RN 12/31/2016, 11:47 AM  Patient Details  Name: [name] [name] MRN: [id number] Date of Birth: [birth year]  Subjective/Objective:    Pt admitted with melanotic stools - pt is an excessive alcohol abuser                Action/Plan:  PTA from home.  Pt is on Precedex drip.  CSW consulted for substance abuse.  CM will continue to follow      Expected Discharge Date:                  Expected Discharge Plan:  Home w Home Health Services  In-House Referral:  Clinical Social Work  Discharge planning Services  CM Consult  Post Acute Care Choice:  Home Health Choice offered to:  Adult Children, Spouse  DME Arranged:    DME Agency:     HH Arranged:  Refused SNF HH Agency:     Status of Service:  In process, will continue to follow  If discussed at Long Length of Stay Meetings, dates discussed:    Additional Comments:  01/10/17- 1500- Tyron Manetta RN, CM- received call from floor RN- with request to see family that has questions about transition of care plans- went to beside and spoke with pt's wife and son regarding plans for discharge- PT/OT with recommendations for SNF- wife and son do not want SNF and would rather take pt home- the verbalize understanding that pt will need 24/7 care at first- and will start looking into out of pocket options to help cover pt needs when he returns home- understand that Medicare will not cover private duty and will only cover skilled Home care such as PT/OT/RN- 2-3 days a week for continued therapy needs 30-45 mins per visit. CM provided family [facility] [place] [facility] and explained CM will f/u for choice and make referral once MD places [facility]. CM to follow for transition of care.   Cherylann ParrClaxton, Samantha S, RN 12/31/2016, 11:47 AM--From home with wife.  Pt is off precedex however requiring IV Cardizem and BIPAP.  CM will continue to follow for  discharge needs   Darrold SpanWebster, Missi Mcmackin Hall, RN 01/10/2017, 3:20 PM [phone]

## 2017-01-10 NOTE — Progress Notes (Signed)
PROGRESS NOTE  [name] [name] DXI:[id number] DOB: 1940/10/30 DOA: 12/26/2016 PCP: Ranae Palms, MD  HPI/Recap of past 24 hours: 76 y.o. W M with a hx of significant ETOH abuse who presented to [facility] with weakness, exertional dyspnea and melanotic stools for several days. EGD 12/27/16 revealed antral ulcer with recommendations for PPI BID. Hospital course complicated by fever of unknown origin, atrial fibrillation with rvr, and acute on chronic diastolic HF.  On 12/28, pt had episode of hypoxia and required Bipap for acute hypoxic and hypercapneic respiratory failure.  CXR negative. Pt was transferred to stepdown. Suspected aspiration initially ruled out after swallow evaluation by speech therapist. Recommendations for reg consistency diet with thin liquids.  No acute events overnight. Pt seen and examined with no family members at bedside. Denies chest pain, dyspnea or palpitations. He is alert and oriented x 3.   Assessment/Plan: Principal Problem:   Acute respiratory failure with hypoxia (HCC) Active Problems:   GI bleed requiring more than 4 units of blood in 24 hours, ICU, or surgery   Melena   Antral ulcer, chronic   Acute blood loss anemia   Alcoholic cirrhosis of liver without ascites (HCC)   SOB (shortness of breath)   Fever   A-fib (HCC)   Acute diastolic CHF (congestive heart failure) (HCC)   Gastric ulcer: Per EGD 12/27/2016   Lytic lesion of bone on x-ray: Left humeral   Acute encephalopathy   UGI bleed  Acute respiratory failure with hypoxia/ hypercapnea 2/2 acute COPD exacerbation -Questionable etiology -A. fib with RVR which has resolved -currently off BiPAP with sats of 96% on 4 L nasal cannula -started on Mucinex DM, chest PT, Claritin, scheduled nebs, 3% saline nebulizer treatment with clinical improvement -oral Lasix -acute respiratory failure 12/28 -CXR not suggestive of CHF -suspect acute Copd exacerbation.   Acute Copd  exacerbation -prednisone 109m po qday  (12/29) -Decrease prednisone to 530mpo qday -Cont zithromax 12/28=> -Cont Duoneb -CPAP for undiagnosed OSA at [facility]  Suspect acute on chronic diastolic CHF exacerbation -exacerbated by A. fib with RVR versus volume overload from packed red blood cell transfusions -Chest x-ray cardiomegaly and mild pulmonary vascular congestion 12/30/2016 -off BiPAP -po lasix IV lasix as needed -daily weight, strict I&O's  Chronic atrial fibrillation -A. fib with RVR requiring Cardizem drip.  -rate controlled -anticoagulation held due to recent GI bleed  Acute upper GI bleed secondary to gastric ulcer -no further tarry stools noted -post upper endoscopy which showed gastric ulcer which was negative for H. pylori and benign per biopsy. -EGD 12/27/16 no signs of portal gastropathy or varices -Pathology suggested ulcerative gastritis -Status post 1 unit packed red blood cells.  -Hemoglobin stable at 8.2.  -Continue PPI BID -Outpatient follow-up with GI.  Acute blood loss anemia -Secondary to above.  -Status post transfusion 1 unit packed red blood cells.  -Continue PPI.  -cbc in am  Acute encephalopathy probably secondary to hypercapnea, resolved -ABG noted and normal pH with a PCO2 in the 60s initially -CT head negative for any acute abnormalities -Follow.  Hypokalemia, resolved -Secondary to diuresis.  -K+ 4.2 -Magnesium level at 2.2.  -Repleted.  Fever resolved -Blood cultures from 12/31/2016 with no growth to date.  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   Alcoholic cirrhosis -Ammonia levels within normal limits -EGD no signs of portal gastropathy or varices -Outpatient follow-up with GI.  Alcohol dependence -CIWA in place   Code Status: DNR  Family Communication: No family member at bedside   Disposition Plan: Will stay another midnight to continue current management   Consultants:  GI  Procedures:  Chest x-ray 12/27/2016, 12/28/2016, 12/30/2016, 12/31/2016, 01/05/2017  2D echo 12/31/2016  Upper endoscopy 12/27/2016 per Dr. Carlean Purl, nonbleeding gastric ulcer noted  Transfused 1 unit packed red blood cells 12/29/2016  Right lower extremity Dopplers 01/03/2017--- negative for DVT, negative for Baker's cyst  Skeletal survey 01/03/2017  CT head 01/05/2017  Antimicrobials:  IV Zosyn 12/27/2016>>>> 12/28/2016  IV Zosyn 12/31/2016>>>>>> 01/04/2017  IV vancomycin 12/27/2016>>>>> 12/28/2016  zithromax 12/28=>      DVT prophylaxis:  SCDs   Objective: Vitals:   01/10/17 0300 01/10/17 0400 01/10/17 0500 01/10/17 0600  BP: 122/76 (!) 143/81 132/69 (!) 167/81  Pulse: 82 83 76 89  Resp: 20 (!) 21 (!) 22 (!) 23  Temp: 98 F (36.7 C)     TempSrc: Oral     SpO2: 98% 98% 96% 98%  Weight: 79.6 kg (175 lb 7.8 oz)     Height:        Intake/Output Summary (Last 24 hours) at 01/10/2017 0730 Last data filed at 01/10/2017 0400 Gross per 24 hour  Intake 280 ml  Output 550 ml  Net -270 ml   Filed Weights   01/08/17 0438 01/09/17 0357 01/10/17 0300  Weight: 80.5 kg (177 lb 7.5 oz) 79.5 kg (175 lb 4.3 oz) 79.6 kg (175 lb 7.8 oz)    Exam:   General:  76yo CM WD WN NAD x3    Cardiovascular: RRR no rubs or gallops  Respiratory: Mild rales at baseline  Abdomen: soft ND NT NBS x 4  Musculoskeletal: Non focal. No LE edema   Skin: no noted rash  Psychiatry: Mood is appropriate for condition and setting   Data Reviewed: CBC: Recent Labs   Lab 01/05/17 0624 01/06/17 0630 01/07/17 0724 01/08/17 0357 01/09/17 0329  WBC 6.1 8.3 9.0 10.9* 13.4*  HGB 8.4* 8.2* 8.7* 9.3* 8.3*  HCT 27.9* 26.5* 28.7* 31.2* 26.7*  MCV 94.9 92.3 94.1 93.4 92.1  PLT 131* 117* 137* 195 625   Basic Metabolic Panel: Recent Labs  Lab 01/04/17 0538 01/05/17 0624 01/06/17 0630 01/07/17 0724 01/08/17 0357 01/09/17 0329  NA 135 137 136 137 136 138  K 3.0* 3.5 3.9 3.8 4.8 4.2  CL 89* 92* 91* 92* 93* 97*  CO2 39* 38* 35* 36* 34* 33*  GLUCOSE 126* 128* 110* 125* 191* 198*  BUN 12 11 10 15  21* 23*  CREATININE 0.97 0.81 0.76 0.83 0.97 1.05  CALCIUM 7.9* 8.4* 8.2* 8.3* 8.7* 8.7*  MG 2.2  --   --   --   --   --    GFR: Estimated Creatinine Clearance: 60.5 mL/min (by C-G formula based on SCr of 1.05 mg/dL). Liver Function Tests: Recent Labs  Lab 01/08/17 0357 01/09/17 0329  AST 13* 12*  ALT 12* 13*  ALKPHOS 58 52  BILITOT 0.7 0.4  PROT 6.1* 5.8*  ALBUMIN 2.1* 2.1*   No results for input(s): LIPASE, AMYLASE in the last 168 hours. Recent Labs  Lab 01/05/17 1000  AMMONIA 20  Coagulation Profile: No results for input(s): INR, PROTIME in the last 168 hours. Cardiac Enzymes: No results for input(s): CKTOTAL, CKMB, CKMBINDEX, TROPONINI in the last 168 hours. BNP (last 3 results) No results for input(s): PROBNP in the last 8760 hours. HbA1C: No results for input(s): HGBA1C in the last 72 hours. CBG: No results for input(s): GLUCAP in the last 168 hours. Lipid Profile: No results for input(s): CHOL, HDL, LDLCALC, TRIG, CHOLHDL, LDLDIRECT in the last 72 hours. Thyroid Function Tests: No results for input(s): TSH, T4TOTAL, FREET4, T3FREE, THYROIDAB in the last 72 hours. Anemia Panel: No results for input(s): VITAMINB12, FOLATE, FERRITIN, TIBC, IRON, RETICCTPCT in the last 72 hours. Urine analysis:    Component Value Date/Time   COLORURINE YELLOW 12/31/2016 [place] 12/31/2016 0934   LABSPEC 1.020 12/31/2016 0934    PHURINE 5.5 12/31/2016 0934   GLUCOSEU NEGATIVE 12/31/2016 0934   HGBUR MODERATE (A) 12/31/2016 0934   BILIRUBINUR NEGATIVE 12/31/2016 0934   KETONESUR 15 (A) 12/31/2016 0934   PROTEINUR NEGATIVE 12/31/2016 0934   NITRITE NEGATIVE 12/31/2016 0934   LEUKOCYTESUR NEGATIVE 12/31/2016 0934   Sepsis Labs: @LABRCNTIP (procalcitonin:4,lacticidven:4)  ) Recent Results (from the past 240 hour(s))  Culture, blood (Routine X 2) w Reflex to ID Panel     Status: None   Collection Time: 12/31/16  9:28 AM  Result Value Ref Range Status   Specimen Description BLOOD RIGHT HAND  Final   Special Requests   Final    BOTTLES DRAWN AEROBIC AND ANAEROBIC Blood Culture adequate volume   Culture NO GROWTH 5 DAYS  Final   Report Status 01/05/2017 FINAL  Final  Culture, Urine     Status: None   Collection Time: 12/31/16  9:34 AM  Result Value Ref Range Status   Specimen Description URINE, RANDOM  Final   Special Requests NONE  Final   Culture NO GROWTH  Final   Report Status 01/01/2017 FINAL  Final  Culture, blood (Routine X 2) w Reflex to ID Panel     Status: None   Collection Time: 12/31/16  9:35 AM  Result Value Ref Range Status   Specimen Description BLOOD LEFT HAND  Final   Special Requests   Final    BOTTLES DRAWN AEROBIC AND ANAEROBIC Blood Culture results may not be optimal due to an inadequate volume of blood received in culture bottles   Culture NO GROWTH 5 DAYS  Final   Report Status 01/05/2017 FINAL  Final      Studies: Dg Swallowing Func-speech Pathology  Result Date: 01/09/2017 Objective Swallowing Evaluation: Type of Study: MBS-Modified Barium Swallow Study  Patient Details Name: [name] [name] MRN: [id number] Date of Birth: 24-May-1940 Today's Date: 01/09/2017 Time: SLP Start Time (ACUTE ONLY): 1150 -SLP Stop Time (ACUTE ONLY): 1215 SLP Time Calculation (min) (ACUTE ONLY): 25 min Past Medical History: Past Medical History: Diagnosis Date . Atrial fibrillation ([place])  . COPD (chronic  obstructive pulmonary disease) ([place])  . History of alcohol abuse  . Hypertension  Past Surgical History: Past Surgical History: Procedure Laterality Date . ESOPHAGOGASTRODUODENOSCOPY N/A 12/27/2016  Procedure: ESOPHAGOGASTRODUODENOSCOPY (EGD);  Surgeon: Gatha Mayer, MD;  Location: [facility] ENDOSCOPY;  Service: Endoscopy;  Laterality: N/A; . NO PAST SURGERIES   HPI: Pt is a 76 yo M with heavy ETOH abuse with likely UGIB and significant anemia. Lung exam suggestive of RLL consolidation. Per the family, he was seen at [facility] 2 days ago; was told his Hb was low but left AMA. He  was also at [facility] 2-3 weeks ago and was intubated for ETOH withdrawal. UGI 12/17 showed non-bleeding gastric ulcer. PMH also includes COPD, a fib, and HTN.  Subjective: I'm thirsty Assessment / Plan / Recommendation CHL IP CLINICAL IMPRESSIONS 01/09/2017 Clinical Impression Patient presents with moderate risk for aspiration and mild-moderate pharyngoesophageal dysphagia with suspected primary esophageal component. There was intermittent laryngeal penetration during the swallow of all liquid consistencies (thin, nectar and honey), though no frank aspiration observed pt is at risk for aspiration given consistent penetration. During the exam, he had difficulty following commands to cough or clear throat for airway clearance, though when he did follow this command once it was effective. Penetration occurs primarily due to decreased laryngeal closure, though it also occurs with esophageal backflow which spills from the pyriforms during subsequent swallows. A sweep to the esophagus revealed slow clearance and to and fro movements of contrast. MBS does not diagnose dysphagia below the level of the UES, but I suspect esophageal pressures could be impacting pharyngeal phase. Following the exam, I spoke with pt and his sons, and pt's wife via phone to educate re: aspiration risks and possible treatment options. Pt was very clear in verbalizing  his wish to eat and drink by mouth, accepting risks for aspiration. Wife and sons are also in agreement that pt would not want a feeding tube. Pt's respiratory status has been improving. I recommend regular diet with thin liquids via single sips, with cough or throat clear after swallow to clear laryngeal vestibule. In follow-up pt was able to follow commands consistently for slow rate and hard cough after swallow. Pt and family verbalized understanding of risks for aspiration, importance of upright posture and slow rate, oral care before and after meals to minimize risks. Will follow up briefly for education.  SLP Visit Diagnosis Dysphagia, pharyngeal phase (R13.13);Dysphagia, pharyngoesophageal phase (R13.14) Attention and concentration deficit following -- Frontal lobe and executive function deficit following -- Impact on safety and function Moderate aspiration risk   CHL IP TREATMENT RECOMMENDATION 01/09/2017 Treatment Recommendations Therapy as outlined in treatment plan below   Prognosis 01/09/2017 Prognosis for Safe Diet Advancement Good Barriers to Reach Goals -- Barriers/Prognosis Comment -- CHL IP DIET RECOMMENDATION 01/09/2017 SLP Diet Recommendations Regular solids;Thin liquid Liquid Administration via Cup;Straw Medication Administration Whole meds with puree Compensations Slow rate;Small sips/bites;Hard cough after swallow Postural Changes Remain semi-upright after after feeds/meals (Comment);Seated upright at 90 degrees   CHL IP OTHER RECOMMENDATIONS 01/09/2017 Recommended Consults -- Oral Care Recommendations Oral care before and after PO Other Recommendations Clarify dietary restrictions   CHL IP FOLLOW UP RECOMMENDATIONS 01/09/2017 Follow up Recommendations None   CHL IP FREQUENCY AND DURATION 01/09/2017 Speech Therapy Frequency (ACUTE ONLY) min 1 x/week Treatment Duration 1 week      CHL IP ORAL PHASE 01/09/2017 Oral Phase WFL Oral - Pudding Teaspoon -- Oral - Pudding Cup -- Oral - Honey Teaspoon  -- Oral - Honey Cup -- Oral - Nectar Teaspoon -- Oral - Nectar Cup -- Oral - Nectar Straw -- Oral - Thin Teaspoon -- Oral - Thin Cup -- Oral - Thin Straw -- Oral - Puree -- Oral - Mech Soft -- Oral - Regular -- Oral - Multi-Consistency -- Oral - Pill -- Oral Phase - Comment --  CHL IP PHARYNGEAL PHASE 01/09/2017 Pharyngeal Phase Impaired Pharyngeal- Pudding Teaspoon -- Pharyngeal -- Pharyngeal- Pudding Cup -- Pharyngeal -- Pharyngeal- Honey Teaspoon -- Pharyngeal -- Pharyngeal- Honey Cup Delayed swallow initiation-vallecula;Reduced airway/laryngeal closure;Penetration/Aspiration during swallow Pharyngeal Material  enters airway, remains ABOVE vocal cords and not ejected out Pharyngeal- Nectar Teaspoon -- Pharyngeal -- Pharyngeal- Nectar Cup Delayed swallow initiation-vallecula;Reduced airway/laryngeal closure;Penetration/Aspiration during swallow Pharyngeal Material enters airway, CONTACTS cords and not ejected out Pharyngeal- Nectar Straw -- Pharyngeal -- Pharyngeal- Thin Teaspoon -- Pharyngeal -- Pharyngeal- Thin Cup Delayed swallow initiation-vallecula;Reduced airway/laryngeal closure;Penetration/Aspiration during swallow Pharyngeal Material enters airway, CONTACTS cords and not ejected out Pharyngeal- Thin Straw -- Pharyngeal -- Pharyngeal- Puree Delayed swallow initiation-vallecula Pharyngeal -- Pharyngeal- Mechanical Soft -- Pharyngeal -- Pharyngeal- Regular Delayed swallow initiation-vallecula Pharyngeal -- Pharyngeal- Multi-consistency -- Pharyngeal -- Pharyngeal- Pill NT Pharyngeal -- Pharyngeal Comment --  CHL IP CERVICAL ESOPHAGEAL PHASE 01/09/2017 Cervical Esophageal Phase Impaired Pudding Teaspoon -- Pudding Cup -- Honey Teaspoon -- Honey Cup Esophageal backflow into cervical esophagus;Esophageal backflow into the pharynx Nectar Teaspoon -- Nectar Cup Esophageal backflow into cervical esophagus;Esophageal backflow into the pharynx Nectar Straw -- Thin Teaspoon -- Thin Cup Esophageal backflow into  cervical esophagus;Esophageal backflow into the pharynx Thin Straw -- Puree Esophageal backflow into cervical esophagus;Esophageal backflow into the pharynx Mechanical Soft -- Regular -- Multi-consistency -- Pill -- Cervical Esophageal Comment -- No flowsheet data found. Aliene Altes 01/09/2017, 2:15 PM  Deneise Lever, MS, CCC-SLP Speech-Language Pathologist [phone]             Scheduled Meds: . budesonide (PULMICORT) nebulizer solution  0.5 mg Nebulization BID  . dextromethorphan-guaiFENesin  2 tablet Oral BID  . diltiazem  120 mg Oral q morning - 10a  . feeding supplement (PRO-STAT SUGAR FREE 64)  30 mL Oral BID  . ferrous sulfate  325 mg Oral Daily  . fluticasone  2 spray Each Nare Daily  . furosemide  40 mg Oral BID  . ipratropium-albuterol  3 mL Nebulization TID  . loratadine  10 mg Oral Daily  . mouth rinse  15 mL Mouth Rinse BID  . mouth rinse  15 mL Mouth Rinse BID  . multivitamin with minerals  1 tablet Oral Daily  . nicotine  21 mg Transdermal Daily  . pantoprazole  40 mg Oral BID  . potassium chloride  40 mEq Oral Daily  . predniSONE  50 mg Oral Q breakfast  . tamsulosin  0.4 mg Oral QPC supper  . thiamine  100 mg Oral Daily    Continuous Infusions: . sodium chloride Stopped (12/29/16 1100)  . azithromycin Stopped (01/09/17 1745)     LOS: 15 days     Kayleen Memos, MD [facility] Pager [phone]  If 7PM-7AM, please contact night-coverage [email or web address] Password [email or web address] 01/10/2017, 7:30 AM

## 2017-01-10 NOTE — Progress Notes (Signed)
Unable to really do any quality teaching with patient, has periods of confusion, will not wear bipap, O2 on and off.

## 2017-01-11 ENCOUNTER — Inpatient Hospital Stay (HOSPITAL_COMMUNITY): Payer: Medicare Other

## 2017-01-11 LAB — COMPREHENSIVE METABOLIC PANEL
ALBUMIN: 2.4 g/dL — AB (ref 3.5–5.0)
ALT: 27 U/L (ref 17–63)
ANION GAP: 7 (ref 5–15)
AST: 25 U/L (ref 15–41)
Alkaline Phosphatase: 60 U/L (ref 38–126)
BUN: 21 mg/dL — ABNORMAL HIGH (ref 6–20)
CHLORIDE: 94 mmol/L — AB (ref 101–111)
CO2: 32 mmol/L (ref 22–32)
CREATININE: 1.1 mg/dL (ref 0.61–1.24)
Calcium: 8.8 mg/dL — ABNORMAL LOW (ref 8.9–10.3)
GFR calc Af Amer: >60 mL/min (ref >60)
GFR calc non Af Amer: >60 mL/min (ref >60)
Glucose, Bld: 103 mg/dL — ABNORMAL HIGH (ref 65–99)
Potassium: 3.6 mmol/L (ref 3.5–5.1)
SODIUM: 133 mmol/L — AB (ref 135–145)
Total Bilirubin: 0.5 mg/dL (ref 0.3–1.2)
Total Protein: 5.7 g/dL — ABNORMAL LOW (ref 6.5–8.1)

## 2017-01-11 LAB — GLUCOSE, CAPILLARY: Glucose-Capillary: 90 mg/dL (ref 65–99)

## 2017-01-11 LAB — CBC
HCT: 28.6 % — ABNORMAL LOW (ref 39.0–52.0)
Hemoglobin: 8.7 g/dL — ABNORMAL LOW (ref 13.0–17.0)
MCH: 27.8 pg (ref 26.0–34.0)
MCHC: 30.4 g/dL (ref 30.0–36.0)
MCV: 91.4 fL (ref 78.0–100.0)
PLATELETS: 258 10*3/uL (ref 150–400)
RBC: 3.13 MIL/uL — AB (ref 4.22–5.81)
RDW: 17.2 % — ABNORMAL HIGH (ref 11.5–15.5)
WBC: 9.6 10*3/uL (ref 4.0–10.5)

## 2017-01-11 LAB — POCT I-STAT 3, ART BLOOD GAS (G3+)
Acid-Base Excess: 9 mmol/L — ABNORMAL HIGH (ref 0.0–2.0)
Bicarbonate: 35.4 mmol/L — ABNORMAL HIGH (ref 20.0–28.0)
O2 Saturation: 95 %
PH ART: 7.422 (ref 7.350–7.450)
TCO2: 37 mmol/L — AB (ref 22–32)
pCO2 arterial: 54 mmHg — ABNORMAL HIGH (ref 32.0–48.0)
pO2, Arterial: 73 mmHg — ABNORMAL LOW (ref 83.0–108.0)

## 2017-01-11 LAB — AMMONIA: AMMONIA: 13 umol/L (ref 9–35)

## 2017-01-11 MED ORDER — FUROSEMIDE 10 MG/ML IJ SOLN
40.0000 mg | Freq: Two times a day (BID) | INTRAMUSCULAR | Status: DC
  Administered 2017-01-11 – 2017-01-14 (×8): 40 mg via INTRAVENOUS
  Filled 2017-01-11 (×8): qty 4

## 2017-01-11 NOTE — Progress Notes (Signed)
Pt is more alert and speaking to family present in his room.  RT took pt off Bipap, placed on [place] 4 L, pt tolerating well at this time.  Pt has a small productive cough.  RN notified.

## 2017-01-11 NOTE — Progress Notes (Signed)
Patient is refusing BiPAP at this time.  RT made RN aware.

## 2017-01-11 NOTE — Progress Notes (Signed)
01/11/2017 0715 During AM assessment, RN noted increased WOB for Pt. O2 sats 85-88% on 4L [place]. Pt. Lethargic, opens eyes to voice and follows commands. Pt. Placed back on Bipap as per prior orders and MD notified. Will continue to closely monitor patient.  [name] [name], Blanchard KelchStephanie Ingold

## 2017-01-11 NOTE — Progress Notes (Signed)
Pt placed on Bipap by RN due to increased WOB.  RN notified RT at 0710.

## 2017-01-11 NOTE — Progress Notes (Signed)
01/11/2017 0915 Dr. Margo AyeHall called to bedside to reassess pt. Mental status and continued lethargy. Orders received. Will continue to closely monitor patient.  Ixel Boehning, Blanchard KelchStephanie Ingold

## 2017-01-11 NOTE — Progress Notes (Signed)
01/11/2017 8:26 AM Dr. Margo AyeHall paged and made aware of ABG results and CXR availability in Epic. Will await callback and further orders.  Andelyn Spade, Blanchard KelchStephanie Ingold

## 2017-01-11 NOTE — Progress Notes (Signed)
Placed patient on BIPAP for HS per order. No distress noted. RN aware.

## 2017-01-11 NOTE — Progress Notes (Signed)
PROGRESS NOTE  PIUS BYROM GXQ:[id number] DOB: [birth year] DOA: 12/26/2016 PCP: Ranae Palms, MD  HPI/Recap of past 24 hours: 77 y.o. W M with a hx of significant ETOH abuse who presented to [facility] with weakness, exertional dyspnea and melanotic stools for several days. EGD 12/27/16 revealed antral ulcer with recommendations for PPI BID. Hospital course complicated by fever of unknown origin, atrial fibrillation with rvr, and acute on chronic diastolic HF.  On 12/28, pt had episode of hypoxia and required Bipap for acute hypoxic and hypercapneic respiratory failure.  CXR negative. Pt was transferred to stepdown. Suspected aspiration initially ruled out after swallow evaluation by speech therapist. Recommendations for reg consistency diet with thin liquids.  Reported increase work of breathing and O2 sat in the 80's on 4L [place] early this morning at beginning of shift. Stat ABG and stat cxr ordered. Patient is somnolent arouses to voices and follows commands. Will continue to monitor closely.  Assessment/Plan: Principal Problem:   Acute respiratory failure with hypoxia (HCC) Active Problems:   GI bleed requiring more than 4 units of blood in 24 hours, ICU, or surgery   Melena   Antral ulcer, chronic   Acute blood loss anemia   Alcoholic cirrhosis of liver without ascites (HCC)   SOB (shortness of breath)   Fever   A-fib (HCC)   Acute diastolic CHF (congestive heart failure) (HCC)   Gastric ulcer: Per EGD 12/27/2016   Lytic lesion of bone on x-ray: Left humeral   Acute encephalopathy   UGI bleed  Acute respiratory failure with hypoxia/ hypercapnea 2/2 acute COPD exacerbation -most likely multifactorial -A. fib with RVR which has resolved vs acute COPD exacerbation -BiPAP at night -continues to require increase O2 supplementation -CXR and ABG stat this am -Mucinex DM, chest PT, Claritin, scheduled nebs, 3% saline nebulizer -oral Lasix  Acute Copd  exacerbation -prednisone 40m po qday  (12/29) -Decrease prednisone to 564mpo qday -Cont zithromax 12/28=> -Cont Duoneb -CPAP/BIPAP for undiagnosed OSA at [facility]  Suspect acute on chronic diastolic CHF exacerbation -exacerbated by A. fib with RVR versus volume overload from packed red blood cell transfusions -Chest x-ray cardiomegaly and mild pulmonary vascular congestion 12/30/2016 -po lasix -IV lasix as needed -daily weight, strict I&O's  Chronic atrial fibrillation -A. fib with RVR requiring Cardizem drip.  -rate controlled -anticoagulation held due to recent GI bleed  Acute upper GI bleed secondary to gastric ulcer -no further tarry stools noted -post upper endoscopy which showed gastric ulcer which was negative for H. pylori and benign per biopsy. -EGD 12/27/16 no signs of portal gastropathy or varices -Pathology suggested ulcerative gastritis -Status post 1 unit packed red blood cells.  -Hemoglobin stable at 8.2.  -Continue PPI BID -Outpatient follow-up with GI.  Acute blood loss anemia -Secondary to above.  -Status post transfusion 1 unit packed red blood cells.  -Continue PPI.  -cbc in am  Acute encephalopathy probably secondary to hypercapnea, resolved -ABG noted and normal pH with a PCO2 in the 60s initially -CT head negative for any acute abnormalities -Follow.  Hypokalemia, resolved -Secondary to diuresis.  -K+ 4.2 -Magnesium level at 2.2.  -Repleted.  Fever resolved -Blood cultures from 12/31/2016 with no growth to date.  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   Alcoholic cirrhosis -Ammonia levels within normal limits -EGD no signs of portal gastropathy or varices -Outpatient follow-up with GI.  Alcohol dependence -CIWA in place   Code Status: DNR  Family Communication: No family member at bedside   Disposition Plan: Will stay another midnight to continue current management   Consultants:  GI  Procedures:  Chest x-ray 12/27/2016, 12/28/2016, 12/30/2016, 12/31/2016, 01/05/2017  2D echo 12/31/2016  Upper endoscopy 12/27/2016 per Dr. Carlean Purl, nonbleeding gastric ulcer noted  Transfused 1 unit packed red blood cells 12/29/2016  Right lower extremity Dopplers 01/03/2017--- negative for DVT, negative for Baker's cyst  Skeletal survey 01/03/2017  CT head 01/05/2017  Antimicrobials:  IV Zosyn 12/27/2016>>>> 12/28/2016  IV Zosyn 12/31/2016>>>>>> 01/04/2017  IV vancomycin 12/27/2016>>>>> 12/28/2016  zithromax 12/28=>      DVT prophylaxis:  SCDs   Objective: Vitals:   01/11/17 0400 01/11/17 0500 01/11/17 0600 01/11/17 0700  BP: (!) 146/67 (!) 153/90 (!) 145/78 140/68  Pulse: 84 92 82 77  Resp: (!) 24 (!) 23 (!) 29 (!) 27  Temp:      TempSrc:      SpO2: 94% 94% 94% 91%  Weight:   79.7 kg (175 lb 11.3 oz)   Height:        Intake/Output Summary (Last 24 hours) at 01/11/2017 0742 Last data filed at 01/10/2017 1400 Gross per 24 hour  Intake 500 ml  Output -  Net 500 ml   Filed Weights   01/09/17 0357 01/10/17 0300 01/11/17 0600  Weight: 79.5 kg (175 lb 4.3 oz) 79.6 kg (175 lb 7.8 oz) 79.7 kg (175 lb 11.3 oz)    Exam:   General:  76yo CM WD WN NAD x3    Cardiovascular: RRR no rubs or gallops  Respiratory: Mild rales at baseline  Abdomen: soft ND NT NBS x 4  Musculoskeletal: Non focal. No LE edema   Skin: no noted rash  Psychiatry: Mood is appropriate for condition and setting   Data Reviewed: CBC: Recent Labs  Lab 01/05/17 0624  01/06/17 0630 01/07/17 0724 01/08/17 0357 01/09/17 0329  WBC 6.1 8.3 9.0 10.9* 13.4*  HGB 8.4* 8.2* 8.7* 9.3* 8.3*  HCT 27.9* 26.5* 28.7* 31.2* 26.7*  MCV 94.9 92.3 94.1 93.4 92.1  PLT 131* 117* 137* 195 161   Basic Metabolic Panel: Recent Labs  Lab 01/05/17 0624 01/06/17 0630 01/07/17 0724 01/08/17 0357 01/09/17 0329  NA 137 136 137 136 138  K 3.5 3.9 3.8 4.8 4.2  CL 92* 91* 92* 93* 97*  CO2 38* 35* 36* 34* 33*  GLUCOSE 128* 110* 125* 191* 198*  BUN 11 10 15  21* 23*  CREATININE 0.81 0.76 0.83 0.97 1.05  CALCIUM 8.4* 8.2* 8.3* 8.7* 8.7*   GFR: Estimated Creatinine Clearance: 60.5 mL/min (by C-G formula based on SCr of 1.05 mg/dL). Liver Function Tests: Recent Labs  Lab 01/08/17 0357 01/09/17 0329  AST 13* 12*  ALT 12* 13*  ALKPHOS 58 52  BILITOT 0.7 0.4  PROT 6.1* 5.8*  ALBUMIN 2.1* 2.1*   No results for input(s): LIPASE, AMYLASE in the last 168 hours. Recent Labs  Lab 01/05/17 1000  AMMONIA 20   Coagulation Profile: No results for input(s): INR, PROTIME in the last 168 hours. Cardiac Enzymes: No results for input(s): CKTOTAL, CKMB, CKMBINDEX, TROPONINI in the last 168 hours. BNP (last 3  results) No results for input(s): PROBNP in the last 8760 hours. HbA1C: No results for input(s): HGBA1C in the last 72 hours. CBG: No results for input(s): GLUCAP in the last 168 hours. Lipid Profile: No results for input(s): CHOL, HDL, LDLCALC, TRIG, CHOLHDL, LDLDIRECT in the last 72 hours. Thyroid Function Tests: No results for input(s): TSH, T4TOTAL, FREET4, T3FREE, THYROIDAB in the last 72 hours. Anemia Panel: No results for input(s): VITAMINB12, FOLATE, FERRITIN, TIBC, IRON, RETICCTPCT in the last 72 hours. Urine analysis:    Component Value Date/Time   COLORURINE YELLOW 12/31/2016 [place] 12/31/2016 0934   LABSPEC 1.020 12/31/2016 0934   PHURINE 5.5 12/31/2016 0934   GLUCOSEU NEGATIVE 12/31/2016 0934   HGBUR MODERATE (A) 12/31/2016 0934    BILIRUBINUR NEGATIVE 12/31/2016 0934   KETONESUR 15 (A) 12/31/2016 0934   PROTEINUR NEGATIVE 12/31/2016 0934   NITRITE NEGATIVE 12/31/2016 0934   LEUKOCYTESUR NEGATIVE 12/31/2016 0934   Sepsis Labs: @LABRCNTIP (procalcitonin:4,lacticidven:4)  ) No results found for this or any previous visit (from the past 240 hour(s)).    Studies: No results found.  Scheduled Meds: . budesonide (PULMICORT) nebulizer solution  0.5 mg Nebulization BID  . dextromethorphan-guaiFENesin  2 tablet Oral BID  . diltiazem  120 mg Oral q morning - 10a  . feeding supplement (PRO-STAT SUGAR FREE 64)  30 mL Oral BID  . ferrous sulfate  325 mg Oral Daily  . fluticasone  2 spray Each Nare Daily  . furosemide  40 mg Oral BID  . ipratropium-albuterol  3 mL Nebulization TID  . loratadine  10 mg Oral Daily  . mouth rinse  15 mL Mouth Rinse BID  . mouth rinse  15 mL Mouth Rinse BID  . multivitamin with minerals  1 tablet Oral Daily  . nicotine  21 mg Transdermal Daily  . pantoprazole  40 mg Oral BID  . potassium chloride  40 mEq Oral Daily  . predniSONE  50 mg Oral Q breakfast  . tamsulosin  0.4 mg Oral QPC supper  . thiamine  100 mg Oral Daily    Continuous Infusions: . sodium chloride Stopped (12/29/16 1100)  . azithromycin Stopped (01/10/17 1500)     LOS: 16 days     Kayleen Memos, MD [facility] Pager [phone]  If 7PM-7AM, please contact night-coverage [email or web address] Password [facility] 01/11/2017, 7:42 AM

## 2017-01-11 NOTE — Progress Notes (Signed)
01/11/2017 3:08 PM Confrimed with Dr. Margo AyeHall keep pt. In ICU today due to increased o2 demand today. Orders enacted.  Shalea Tomczak, Blanchard KelchStephanie Ingold

## 2017-01-12 LAB — COMPREHENSIVE METABOLIC PANEL
ALBUMIN: 2.3 g/dL — AB (ref 3.5–5.0)
ALT: 24 U/L (ref 17–63)
ANION GAP: 7 (ref 5–15)
AST: 16 U/L (ref 15–41)
Alkaline Phosphatase: 58 U/L (ref 38–126)
BUN: 20 mg/dL (ref 6–20)
CO2: 33 mmol/L — AB (ref 22–32)
Calcium: 8.8 mg/dL — ABNORMAL LOW (ref 8.9–10.3)
Chloride: 94 mmol/L — ABNORMAL LOW (ref 101–111)
Creatinine, Ser: 1.08 mg/dL (ref 0.61–1.24)
GFR calc Af Amer: >60 mL/min (ref >60)
GFR calc non Af Amer: >60 mL/min (ref >60)
GLUCOSE: 108 mg/dL — AB (ref 65–99)
POTASSIUM: 3.7 mmol/L (ref 3.5–5.1)
SODIUM: 134 mmol/L — AB (ref 135–145)
Total Bilirubin: 0.5 mg/dL (ref 0.3–1.2)
Total Protein: 5.6 g/dL — ABNORMAL LOW (ref 6.5–8.1)

## 2017-01-12 LAB — CBC
HCT: 29.8 % — ABNORMAL LOW (ref 39.0–52.0)
HEMOGLOBIN: 9.1 g/dL — AB (ref 13.0–17.0)
MCH: 27.7 pg (ref 26.0–34.0)
MCHC: 30.5 g/dL (ref 30.0–36.0)
MCV: 90.9 fL (ref 78.0–100.0)
Platelets: 261 10*3/uL (ref 150–400)
RBC: 3.28 MIL/uL — ABNORMAL LOW (ref 4.22–5.81)
RDW: 17 % — AB (ref 11.5–15.5)
WBC: 9.6 10*3/uL (ref 4.0–10.5)

## 2017-01-12 LAB — GLUCOSE, CAPILLARY: Glucose-Capillary: 106 mg/dL — ABNORMAL HIGH (ref 65–99)

## 2017-01-12 MED ORDER — ZOLPIDEM TARTRATE 5 MG PO TABS
5.0000 mg | ORAL_TABLET | Freq: Once | ORAL | Status: AC
Start: 2017-01-13 — End: 2017-01-13
  Administered 2017-01-13: 5 mg via ORAL
  Filled 2017-01-12: qty 1

## 2017-01-12 NOTE — Progress Notes (Signed)
Pt declined use of BIPAP tonight.  Machine available at bedside.  Pt resting comfortably, no distress.

## 2017-01-12 NOTE — Progress Notes (Addendum)
Physical Therapy Treatment Patient Details Name: [name] GenerousJerry F Facchini MRN: [id number] DOB: Apr 11, 1940 Today's Date: 01/12/2017    History of Present Illness Pt is a 77 y.o. who [facility] with weakness, exertional dyspnea, and melanotic stools and was subsequently [facility]. Pt with multiple hospitalizations in the month prior to presentation. He has a history of ETOH abuse. Pt found to have gastric ulcer and hospital course complicated by fever of unknown origin, atrial fibrillation with RVR, and acute on chronic diastolic heart failure. Pt also with lytic lesions in L humeruse, L fibula, and skull. Pt transferred to ICU on 12/28 with respiratory failure and required bipap.    PT Comments    Pt required max encouragement for transfer, exercises and balance activities during session.  Pt will require significant assistance at home and remains unsafe to return at this time.  The safest option for placement remains to be SNF but at this time patient and his family are refusing placement.  Plan next session to continue transfer training to improve strength and function before return home.  BP pre session 129/47 BP post session 109/88   O2 sats RA > 90% throughout.     Follow Up Recommendations  SNF;Supervision/Assistance - 24 hour(Pt and family refusing SNF will need to max out HH options.  )     Equipment Recommendations  Wheelchair (measurements PT);Wheelchair cushion (measurements PT)    Recommendations for Other Services       Precautions / Restrictions Precautions Precautions: Fall Restrictions Weight Bearing Restrictions: No    Mobility  Bed Mobility Overal bed mobility: Needs Assistance Bed Mobility: Supine to Sit       Sit to supine: Mod assist   General bed mobility comments: Cues for hand placement on railing, assist to advance LEs to edge of bed and assist to elevate trunk into sitting.  Pt performed with intermittent +2 once sitting  edge of bed due to poor balance in sitting with L lateral lean.  Constant cues for erect posture in sitting.    Transfers Overall transfer level: Needs assistance Equipment used: None;2 person hand held assist Transfers: Sit to/from [employer]Stand;Stand Pivot Transfers Sit to Stand: Mod assist;+2 physical assistance Stand pivot transfers: Max assist;+2 physical assistance       General transfer comment: Cues for pushing through B LEs, forward weight shifting and extension of hips and upper trunk into standing.  Pt flexed during stand pivot and performed additional 2 trials with sara stedy.  Pt able to stand more erect in sara stedy but not motiavted to progress length of standing time/tolerance.  Pt requires +2 assistance to boost into standing.    Ambulation/Gait Ambulation/Gait assistance: (Not performed patient will need to improve standing tolerance and posture to progress to functional gait training.  )               Stairs            Wheelchair Mobility    Modified Rankin (Stroke Patients Only)       Balance Overall balance assessment: Needs assistance Sitting-balance support: Feet supported;Bilateral upper extremity supported Sitting balance-Leahy Scale: Poor Sitting balance - Comments: Pt sat edge of bed for performance of ADLs with OT he required mod assist to right and improve balance sitting edge of bed.  Pt requires max cues for encouragement to participate in correcting his posture and sitting balance.   Postural control: Left lateral lean   Standing balance-Leahy Scale: Poor Standing balance  comment: Pt with limited stance time in standing, presented with flexed posture and LEs.  Pt fatigues quickly in standing and requires ecouragement to challenge his standing tolerance.  Pt able to stand x 10 seconds on 3 standing trial in sara stedy frame.                              Cognition Arousal/Alertness: Awake/alert Behavior During Therapy: WFL for tasks  assessed/performed Overall Cognitive Status: (not formally assessed but patient functioned appropriately during session.  )                                 General Comments: Pt appeared to be more stubborn to perform tasks as he reports he cannot do them without trying, when encouraged he did try to complete the tasks.        Exercises General Exercises - Lower Extremity Long Arc Quad: AROM;Both;10 reps;Seated(2x5 reps with cues for correct technique.  )    General Comments        Pertinent Vitals/Pain Pain Assessment: Faces Faces Pain Scale: Hurts a little bit Pain Location: generalized Pain Descriptors / Indicators: Grimacing Pain Intervention(s): Monitored during session;Repositioned    Home Living                      Prior Function            PT Goals (current goals can now be found in the care plan section) Acute Rehab PT Goals Patient Stated Goal: to get stronger Potential to Achieve Goals: Good Progress towards PT goals: Progressing toward goals    Frequency    Min 3X/week      PT Plan Current plan remains appropriate    Co-evaluation PT/OT/SLP Co-Evaluation/Treatment: Yes Reason for Co-Treatment: Complexity of the patient's impairments (multi-system involvement);Necessary to address cognition/behavior during functional activity PT goals addressed during session: Mobility/safety with mobility;Balance OT goals addressed during session: ADL's and self-care;Proper use of Adaptive equipment and DME      AM-PAC PT "6 Clicks" Daily Activity  Outcome Measure  Difficulty turning over in bed (including adjusting bedclothes, sheets and blankets)?: Unable Difficulty moving from lying on back to sitting on the side of the bed? : Unable Difficulty sitting down on and standing up from a chair with arms (e.g., wheelchair, bedside commode, etc,.)?: Unable Help needed moving to and from a bed to chair (including a wheelchair)?: A Lot Help  needed walking in hospital room?: Total Help needed climbing 3-5 steps with a railing? : Total 6 Click Score: 7    End of Session Equipment Utilized During Treatment: Oxygen;Gait belt Activity Tolerance: Patient limited by fatigue Patient left: with call bell/phone within reach;in chair;with chair alarm set;with family/visitor present Nurse Communication: Mobility status;Need for lift equipment PT Visit Diagnosis: Muscle weakness (generalized) (M62.81);Other abnormalities of gait and mobility (R26.89);Difficulty in walking, not elsewhere classified (R26.2) Hemiplegia - Right/Left: Right Hemiplegia - dominant/non-dominant: Dominant Hemiplegia - caused by: Unspecified     Time: 1240-1304 PT Time Calculation (min) (ACUTE ONLY): 24 min  Charges:  $Therapeutic Activity: 8-22 mins                    G Codes:       Joycelyn Rua, PTA pager [phone]    Florestine Avers 01/12/2017, 1:22 PM

## 2017-01-12 NOTE — Progress Notes (Addendum)
Occupational Therapy Treatment Patient Details Name: [name] [name] MRN: [id number] DOB: 09/10/1940 Today's Date: 01/12/2017    History of present illness Pt is a 77 y.o. who presented to [facility] with weakness, exertional dyspnea, and melanotic stools and was subsequently transferred to [facility] on 12/26/16. Pt with multiple hospitalizations in the month prior to presentation. He has a history of ETOH abuse. Pt found to have gastric ulcer and hospital course complicated by fever of unknown origin, atrial fibrillation with RVR, and acute on chronic diastolic heart failure. Pt also with lytic lesions in L humerus, L fibula, and skull. Pt transferred to ICU on 12/28 with respiratory failure and required bipap.   OT comments  Pt seen in conjunction with PT this session to maximize functional progress. He is demonstrating progress toward OT goals this session. He continues to demonstrate L lateral lean and requiring up to mod assist to maintain midline positioning during ADL seated at EOB. He was able to assist with LB ADL up to 15% but continues to require total assistance. This session focused on improving activity tolerance for ADL with multiple sit<>stands, B UE strengthening activities, and core strengthening activities. Pt requires maximum encouragement to participate in tasks. Will continue to follow while admitted. Continue to recommend SNF level rehabilitation prior to D/C home; although note family preference for home. See PT note for pertinent vital signs.    Follow Up Recommendations  SNF;Supervision/Assistance - 24 hour    Equipment Recommendations  3 in 1 bedside commode;Tub/shower seat    Recommendations for Other Services      Precautions / Restrictions Precautions Precautions: Fall Restrictions Weight Bearing Restrictions: No       Mobility Bed Mobility Overal bed mobility: Needs Assistance Bed Mobility: Supine to Sit     Supine to sit: Total assist Sit to supine: Mod  assist   General bed mobility comments: Cues for hand placement on railing, assist to advance LEs to edge of bed and assist to elevate trunk into sitting.  Pt performed with intermittent +2 once sitting edge of bed due to poor balance in sitting with L lateral lean.  Constant cues for erect posture in sitting.    Transfers Overall transfer level: Needs assistance Equipment used: None;2 person hand held assist Transfers: Sit to/from [employer] Sit to Stand: Mod assist;+2 physical assistance Stand pivot transfers: Max assist;+2 physical assistance       General transfer comment: Cues for pushing through B LEs, forward weight shifting and extension of hips and upper trunk into standing.  Pt flexed during stand pivot and performed additional 2 trials with sara stedy.  Pt able to stand more erect in sara stedy but not motivated to progress length of standing time/tolerance.  Pt requires +2 assistance to boost into standing.      Balance Overall balance assessment: Needs assistance Sitting-balance support: Feet supported;Bilateral upper extremity supported Sitting balance-Leahy Scale: Poor Sitting balance - Comments: Up to mod assist to maintain midline positioning during ADL. Moments of min assist but L lateral lean is prominent and pt requiring constant cues for midline position and mod assist to correct balance.  Postural control: Left lateral lean Standing balance support: Bilateral upper extremity supported;During functional activity Standing balance-Leahy Scale: Poor Standing balance comment: Pt with limited stance time in standing, presented with flexed posture and LEs.  Pt fatigues quickly in standing and requires ecouragement to challenge his standing tolerance.  Pt able to stand x 10 seconds on 3 standing trial in  sara stedy frame.                             ADL either performed or assessed with clinical judgement   ADL Overall ADL's : Needs  assistance/impaired                     Lower Body Dressing: Sitting/lateral leans;Total assistance Lower Body Dressing Details (indicate cue type and reason): Able to complete final 15% of LB dressing task with backward chaining.  Toilet Transfer: Maximal assistance;+2 for physical assistance;Stand-pivot Toilet Transfer Details (indicate cue type and reason): Unable to maintain fully upright positioning. Able to stand in Stedy frame for initially 6-7 seconds and second attempt for 10 seconds.            General ADL Comments: Facilitated improved dynamic sitting balance with LB dressing tasks at EOB. Pt able to assist but only 15% this session. Requires min-mod assist for balance at EOB due to L lateral lean.     Vision   Vision Assessment?: No apparent visual deficits   Perception     Praxis      Cognition Arousal/Alertness: Awake/alert Behavior During Therapy: WFL for tasks assessed/performed Overall Cognitive Status: Impaired/Different from baseline Area of Impairment: Memory;Problem solving                     Memory: Decreased short-term memory       Problem Solving: Slow processing;Requires verbal cues;Requires tactile cues General Comments: Pt requiring max encouragement and VC's to participate in ADL tasks and strengthening activities. Will report "I can't do that" but then will participate with encouragement. Poor memory of conversations with therapist and RN although pt is able to remember working with OT previously.         Exercises Exercises: Other exercises General Exercises - Lower Extremity Long Arc Quad: AROM;Both;10 reps;Seated(2x5 reps with cues for correct technique.  ) Other Exercises Other Exercises: Facilitated improved B UE and core strength with functional reaching tasks in PNF patterns engaging balance systems to right self and core strength to raise trunk from back of chair.    Shoulder Instructions       General Comments       Pertinent Vitals/ Pain       Pain Assessment: Faces Faces Pain Scale: Hurts a little bit Pain Location: generalized; reports "I hurt all over" Pain Descriptors / Indicators: Grimacing Pain Intervention(s): Monitored during session;Repositioned  Home Living                                          Prior Functioning/Environment              Frequency  Min 2X/week        Progress Toward Goals  OT Goals(current goals can now be found in the care plan section)  Progress towards OT goals: Progressing toward goals  Acute Rehab OT Goals Patient Stated Goal: to go home OT Goal Formulation: With patient Time For Goal Achievement: 01/19/17 Potential to Achieve Goals: Fair  Plan Discharge plan remains appropriate    Co-evaluation    PT/OT/SLP Co-Evaluation/Treatment: Yes Reason for Co-Treatment: Complexity of the patient's impairments (multi-system involvement);Necessary to address cognition/behavior during functional activity PT goals addressed during session: Mobility/safety with mobility;Balance OT goals addressed during session: ADL's and self-care;Strengthening/ROM  AM-PAC PT "6 Clicks" Daily Activity     Outcome Measure   Help from another person eating meals?: A Little Help from another person taking care of personal grooming?: A Little Help from another person toileting, which includes using toliet, bedpan, or urinal?: Total Help from another person bathing (including washing, rinsing, drying)?: Total Help from another person to put on and taking off regular upper body clothing?: A Lot Help from another person to put on and taking off regular lower body clothing?: Total 6 Click Score: 11    End of Session Equipment Utilized During Treatment: Gait belt(Sara Stedy)  OT Visit Diagnosis: Other abnormalities of gait and mobility (R26.89);Muscle weakness (generalized) (M62.81)   Activity Tolerance Patient tolerated treatment well    Patient Left in chair;with call bell/phone within reach;with chair alarm set;with family/visitor present   Nurse Communication Mobility status        Time: 1240-1304 OT Time Calculation (min): 24 min  Charges: OT General Charges $OT Visit: 1 Visit OT Treatments $Self Care/Home Management : 8-22 mins  Doristine Sectionharity A Dymon [place], MS OTR/L  Pager: [phone]    Shekinah Pitones A Evaline Waltman 01/12/2017, 2:57 PM

## 2017-01-12 NOTE — Plan of Care (Signed)
Progressing

## 2017-01-12 NOTE — Progress Notes (Signed)
PROGRESS NOTE  [name] [name] WSF:[id number] DOB: Feb 27, 1940 DOA: 12/26/2016 PCP: Ranae Palms, MD  HPI/Recap of past 24 hours: 77 y.o. W M with a hx of significant ETOH abuse who presented to [facility] with weakness, exertional dyspnea and melanotic stools for several days. EGD 12/27/16 revealed antral ulcer with recommendations for PPI BID. Hospital course complicated by fever of unknown origin, atrial fibrillation with rvr, and acute on chronic diastolic HF.  On 12/28, pt had episode of hypoxia and required Bipap for acute hypoxic and hypercapneic respiratory failure.  CXR negative. Pt was transferred to stepdown. Suspected aspiration initially ruled out after swallow evaluation by speech therapist. 01/11/17 RN noted cough during feeding. Speech therapist reconsulted. Diagnosis of dysphagia 3 with recommendations for mechanical soft consistency thin liquids.   Pt seen and examined with no family members at bedside. Denies dyspnea or chest pain; states he feels better today. On 3L O2 and saturation in the mid 90's. Will transfer to telemetry today from [place]. PT re- evaluated today and recommends SNF.    Assessment/Plan: Principal Problem:   Acute respiratory failure with hypoxia (HCC) Active Problems:   GI bleed requiring more than 4 units of blood in 24 hours, ICU, or surgery   Melena   Antral ulcer, chronic   Acute blood loss anemia   Alcoholic cirrhosis of liver without ascites (HCC)   SOB (shortness of breath)   Fever   A-fib (HCC)   Acute diastolic CHF (congestive heart failure) (HCC)   Gastric ulcer: Per EGD 12/27/2016   Lytic lesion of bone on x-ray: Left humeral   Acute encephalopathy   UGI bleed  Acute respiratory failure with hypoxia/ hypercapnea 2/2 acute COPD exacerbation -most likely multifactorial -A. fib with RVR which has resolved vs acute COPD exacerbation -BiPAP at night -continues to require O2 supplementation -CXR 01/11/17 unremarkable for any acute  findings -ABG with elevated CO2 01/11/17 -Mucinex DM, chest PT, Claritin, scheduled nebs, 3% saline nebulizer -oral Lasix prn  Acute Copd exacerbation -prednisone 78m po qday  (12/29) -Decrease prednisone to 573mpo qday -Cont zithromax 12/28=> -Cont Duoneb -CPAP/BIPAP for undiagnosed OSA at [facility]  Suspect acute on chronic diastolic CHF exacerbation -exacerbated by A. fib with RVR versus volume overload from packed red blood cell transfusions -Chest x-ray cardiomegaly and mild pulmonary vascular congestion 12/30/2016 -po lasix -IV lasix as needed -daily weight, strict I&O's  Chronic atrial fibrillation -A. fib with RVR requiring Cardizem drip initially.  -Now rate controlled -on po cardizem cd -anticoagulation held due to recent GI bleed  Acute upper GI bleed secondary to gastric ulcer -no further tarry stools noted -post upper endoscopy which showed gastric ulcer which was negative for H. pylori and benign per biopsy. -EGD 12/27/16 no signs of portal gastropathy or varices -Pathology suggested ulcerative gastritis -Status post 1 unit packed red blood cells.  -Hemoglobin stable at 8.2.  -Continue PPI po BID -Outpatient follow-up with GI.  Acute blood loss anemia -Secondary to above.  -Status post transfusion 1 unit packed red blood cells.  -Continue PPI.  -ferrous sulfate BID -cbc in am  Acute encephalopathy probably secondary to hypercapnea, resolved -ABG noted and normal pH with a PCO2 in the 60s initially -CT head negative for any acute abnormalities -Follow.  Hypokalemia, resolved -Secondary to diuresis.  -K+ 4.2 -Magnesium level at 2.2.  -Repleted.  Fever resolved -Blood cultures from 12/31/2016 with no growth to date.  -Urine cultures were negative -MRSA PCR negative -post 1 week of IV  antibiotics of vancomycin and Zosyn -Completed zithromax for acute Copd exacerbation  Lytic lesions in left humerus, left fibula and skull with suspicion for  Multiple Myeloma  -Dedicated humeral films with lytic lesion -Skeletal survey with lytic lesions in the left humerus, left fibular and skull -Concern for myeloma -SPEP UPEP pending -Will likely need outpatient follow-up with hematology/oncology.   Alcoholic cirrhosis -Ammonia levels within normal limits -EGD no signs of portal gastropathy or varices -Outpatient follow-up with GI.  Alcohol dependence -CIWA in place   Code Status: DNR  Family Communication: No family member at bedside   Disposition Plan: Will stay another midnight to continue current management   Consultants:  GI  Procedures:  Chest x-ray 12/27/2016, 12/28/2016, 12/30/2016, 12/31/2016, 01/05/2017  2D echo 12/31/2016  Upper endoscopy 12/27/2016 per Dr. Carlean Purl, nonbleeding gastric ulcer noted  Transfused 1 unit packed red blood cells 12/29/2016  Right lower extremity Dopplers 01/03/2017--- negative for DVT, negative for Baker's cyst  Skeletal survey 01/03/2017  CT head 01/05/2017  Antimicrobials:  IV Zosyn 12/27/2016>>>> 12/28/2016  IV Zosyn 12/31/2016>>>>>> 01/04/2017  IV vancomycin 12/27/2016>>>>> 12/28/2016  zithromax 12/28=>01/12/17  DVT prophylaxis:  SCDs   Objective: Vitals:   01/12/17 1100 01/12/17 1145 01/12/17 1200 01/12/17 1300  BP: (!) 109/55  118/64 109/88  Pulse: 83  86 88  Resp: (!) 25  (!) 22 (!) 22  Temp:  97.6 F (36.4 C)    TempSrc:  Oral    SpO2: 98%  94% 96%  Weight:      Height:        Intake/Output Summary (Last 24 hours) at 01/12/2017 1355 Last data filed at 01/12/2017 1100 Gross per 24 hour  Intake 1020 ml  Output 1200 ml  Net -180 ml   Filed Weights   01/10/17 0300 01/11/17 0600 01/12/17 0600  Weight: 79.6 kg (175 lb 7.8 oz) 79.7 kg (175 lb 11.3 oz) 78.9 kg (174 lb)    Exam:   General:  76yo CM WD WN NAD x3    Cardiovascular: RRR no rubs or gallops  Respiratory: Mild crackles at baseline  Abdomen: soft ND NT NBS x 4  Musculoskeletal: Non  focal. Trace LE edema   Skin: no noted rash  Psychiatry: Mood is appropriate for condition and setting   Data Reviewed: CBC: Recent Labs  Lab 01/07/17 0724 01/08/17 0357 01/09/17 0329 01/11/17 1032 01/12/17 0354  WBC 9.0 10.9* 13.4* 9.6 9.6  HGB 8.7* 9.3* 8.3* 8.7* 9.1*  HCT 28.7* 31.2* 26.7* 28.6* 29.8*  MCV 94.1 93.4 92.1 91.4 90.9  PLT 137* 195 215 258 174   Basic Metabolic Panel: Recent Labs  Lab 01/07/17 0724 01/08/17 0357 01/09/17 0329 01/11/17 1032 01/12/17 0354  NA 137 136 138 133* 134*  K 3.8 4.8 4.2 3.6 3.7  CL 92* 93* 97* 94* 94*  CO2 36* 34* 33* 32 33*  GLUCOSE 125* 191* 198* 103* 108*  BUN 15 21* 23* 21* 20  CREATININE 0.83 0.97 1.05 1.10 1.08  CALCIUM 8.3* 8.7* 8.7* 8.8* 8.8*   GFR: Estimated Creatinine Clearance: 54.4 mL/min (by C-G formula based on SCr of 1.08 mg/dL). Liver Function Tests: Recent Labs  Lab 01/08/17 0357 01/09/17 0329 01/11/17 1032 01/12/17 0354  AST 13* 12* 25 16  ALT 12* 13* 27 24  ALKPHOS 58 52 60 58  BILITOT 0.7 0.4 0.5 0.5  PROT 6.1* 5.8* 5.7* 5.6*  ALBUMIN 2.1* 2.1* 2.4* 2.3*   No results for input(s): LIPASE, AMYLASE in the last 168 hours. Recent  Labs  Lab 01/11/17 1032  AMMONIA 13   Coagulation Profile: No results for input(s): INR, PROTIME in the last 168 hours. Cardiac Enzymes: No results for input(s): CKTOTAL, CKMB, CKMBINDEX, TROPONINI in the last 168 hours. BNP (last 3 results) No results for input(s): PROBNP in the last 8760 hours. HbA1C: No results for input(s): HGBA1C in the last 72 hours. CBG: Recent Labs  Lab 01/11/17 1147 01/12/17 0407  GLUCAP 90 106*   Lipid Profile: No results for input(s): CHOL, HDL, LDLCALC, TRIG, CHOLHDL, LDLDIRECT in the last 72 hours. Thyroid Function Tests: No results for input(s): TSH, T4TOTAL, FREET4, T3FREE, THYROIDAB in the last 72 hours. Anemia Panel: No results for input(s): VITAMINB12, FOLATE, FERRITIN, TIBC, IRON, RETICCTPCT in the last 72 hours. Urine  analysis:    Component Value Date/Time   COLORURINE YELLOW 12/31/2016 [place] 12/31/2016 0934   LABSPEC 1.020 12/31/2016 0934   PHURINE 5.5 12/31/2016 0934   GLUCOSEU NEGATIVE 12/31/2016 0934   HGBUR MODERATE (A) 12/31/2016 0934   BILIRUBINUR NEGATIVE 12/31/2016 0934   KETONESUR 15 (A) 12/31/2016 0934   PROTEINUR NEGATIVE 12/31/2016 0934   NITRITE NEGATIVE 12/31/2016 0934   LEUKOCYTESUR NEGATIVE 12/31/2016 0934   Sepsis Labs: _0 (procalcitonin:4,lacticidven:4)  ) No results found for this or any previous visit (from the past 240 hour(s)).    Studies: No results found.  Scheduled Meds: . budesonide (PULMICORT) nebulizer solution  0.5 mg Nebulization BID  . dextromethorphan-guaiFENesin  2 tablet Oral BID  . diltiazem  120 mg Oral q morning - 10a  . feeding supplement (PRO-STAT SUGAR FREE 64)  30 mL Oral BID  . ferrous sulfate  325 mg Oral Daily  . fluticasone  2 spray Each Nare Daily  . furosemide  40 mg Intravenous Q12H  . ipratropium-albuterol  3 mL Nebulization TID  . loratadine  10 mg Oral Daily  . mouth rinse  15 mL Mouth Rinse BID  . multivitamin with minerals  1 tablet Oral Daily  . nicotine  21 mg Transdermal Daily  . pantoprazole  40 mg Oral BID  . potassium chloride  40 mEq Oral Daily  . predniSONE  50 mg Oral Q breakfast  . tamsulosin  0.4 mg Oral QPC supper  . thiamine  100 mg Oral Daily    Continuous Infusions: . sodium chloride Stopped (12/29/16 1100)     LOS: 17 days     Kayleen Memos, MD [facility] Pager [phone]  If 7PM-7AM, please contact night-coverage [email or web address] Password [email or web address] 01/12/2017, 1:55 PM

## 2017-01-12 NOTE — Progress Notes (Addendum)
  Speech Language Pathology Treatment: Dysphagia  Patient Details Name: [name] [name] MRN: [id number] DOB: [birth year] Today's Date: 01/12/2017 Time: [id number] SLP Time Calculation (min) (ACUTE ONLY): 16 min  Assessment / Plan / Recommendation Clinical Impression  Pt NPO after coughing episode and difficulty with 1/2 Potassium pill in applesauce. This SLP reviewed MBS in which pt penetrating most consistencies- discussion of risks had with pt and wife who verbalized understanding and accepted risks for regular texture, thin liquids.  Today pt likely penetrated/aspirated with one sip water- straw did not appear to make significant difference. Pt again understands risks of aspiration. He does want food chopped, therefore recommend Dys 3 diet, thin liquids, pills whole in applesauce and upright position. He followed strategy to clear throat after every other sip initially then needed moderate verbal reminders. Will sign off.     HPI HPI: Pt is a 77 yo M with heavy ETOH abuse with likely UGIB and significant anemia. Lung exam suggestive of RLL consolidation. Per the family, he was [facility] ago; was told his Hb was low but left AMA. He was [facility] at [facility]3 weeks ago and was intubated for ETOH withdrawal. UGI 12/17 showed non-bleeding gastric ulcer. PMH also includes COPD, a fib, and HTN.      SLP Plan  Continue with current plan of care       Recommendations  Diet recommendations: Dysphagia 3 (mechanical soft);Thin liquid Liquids provided via: Cup;Straw Medication Administration: Whole meds with puree Supervision: Patient able to self feed;Intermittent supervision to cue for compensatory strategies Compensations: Slow rate;Small sips/bites;Hard cough after swallow Postural Changes and/or Swallow Maneuvers: Seated upright 90 degrees                Oral Care Recommendations: Oral care BID Follow up Recommendations: None Plan: Continue with current plan of  care       GO                Royce MacadamiaLitaker, Angla Delahunt Willis 01/12/2017, 8:49 AM  Breck CoonsLisa Willis Lonell FaceLitaker M.Ed [employer]-SLP Pager [phone]

## 2017-01-13 MED ORDER — PREDNISONE 20 MG PO TABS
40.0000 mg | ORAL_TABLET | Freq: Every day | ORAL | Status: DC
  Administered 2017-01-14: 40 mg via ORAL
  Filled 2017-01-13: qty 2

## 2017-01-13 MED ORDER — ENSURE ENLIVE PO LIQD
237.0000 mL | ORAL | Status: DC
  Administered 2017-01-13 – 2017-01-17 (×5): 237 mL via ORAL

## 2017-01-13 MED ORDER — TRAZODONE HCL 50 MG PO TABS
50.0000 mg | ORAL_TABLET | Freq: Every day | ORAL | Status: DC
  Administered 2017-01-13 – 2017-01-16 (×4): 50 mg via ORAL
  Filled 2017-01-13 (×4): qty 1

## 2017-01-13 NOTE — Progress Notes (Signed)
Nutrition Follow-up  DOCUMENTATION CODES:   Not applicable  INTERVENTION:   Continue 30 mL Pro-Stat BID, each supplement provides 100 kcal and 15 grams protein  Provide Ensure Enlive po daily, each supplement provides 350 kcal and 20 grams of protein  NUTRITION DIAGNOSIS:   Inadequate oral intake related to lethargy/confusion as evidenced by meal completion < 25%.  Ongoing   GOAL:   Patient will meet greater than or equal to 90% of their needs  Progressing  MONITOR:   PO intake, Supplement acceptance, Diet advancement, Labs, Weight trends, Skin, I & O's  ASSESSMENT:   77 yo M with heavy ETOH abuse with likely UGIB and significant anemia. Lung exam suggestive of RLL consolidation. Although CXR was negative, an infiltrate might become apparent after hydration. Although ETOH level is low need to be concerned about withdrawal.  12/17- s/p EGD, which revealed antral ulcer which was biopsied. Pathology revealed ulcerative gastritis 12/18- s/p bedside swallow evaluation, recommend regular consistency diet with thin liquids 01/02- s/p SLP evaluation, recommend dysphagia 3 with thin liquids  Pt s/p 1 unit packed RBC transfusion Pt with concern for Multiple Myeloma, likely requiring follow-up with outpatient hematology/oncology Pt consumed 65% of breakfast this AM.  Spoke with pt, states his appetite has "increased tremendously."  Pt is down 5 lbs since last RD visit, however, per chart he is net negative since admission.   Labs reviewed; Na 134, Hemoglobin 9.1 Medications reviewed; ferrous sulfate, multivitamin, potassium chloride, Prednisone, thiamine, Protonix  Diet Order:  DIET DYS 3 Room service appropriate? Yes; Fluid consistency: Thin  EDUCATION NEEDS:   Not appropriate for education at this time  Skin:  Skin Assessment: Skin Integrity Issues: Skin Integrity Issues:: Other (Comment) Other: full thickness wounds x 3 on bilateral buttocks and sacrum  Last BM:   01/12/17  Height:   Ht Readings from Last 1 Encounters:  01/03/17 5' 7"  (1.702 m)    Weight:   Wt Readings from Last 1 Encounters:  01/13/17 170 lb 3.1 oz (77.2 kg)    Ideal Body Weight:  67.3 kg  BMI:  Body mass index is 26.66 kg/m.  Estimated Nutritional Needs:   Kcal:  2000-2200  Protein:  100-115 grams  Fluid:  > 2.0 L  Parks Ranger, MS, RDN, LDN 01/13/2017 12:49 PM

## 2017-01-13 NOTE — Care Management Important Message (Signed)
Important Message  Patient Details  Name: [name] [name] MRN: [id number] Date of Birth: [birth year]   Medicare Important Message Given:  Yes    [name] [name] Abena 01/13/2017, 11:01 AM

## 2017-01-13 NOTE — Progress Notes (Signed)
Physical Therapy Treatment Patient Details Name: [name] [name] MRN: [id number] DOB: May 28, 1940 Today's Date: 01/13/2017    History of Present Illness Pt is a 77 y.o. who [facility] with weakness, exertional dyspnea, and melanotic stools and was subsequently [facility] to [facility] on 12/26/16. Pt with multiple hospitalizations in the month prior to presentation. He has a history of ETOH abuse. Pt found to have gastric ulcer and hospital course complicated by fever of unknown origin, atrial fibrillation with RVR, and acute on chronic diastolic heart failure. Pt also with lytic lesions in L humeruse, L fibula, and skull. Pt transferred to ICU on 12/28 with respiratory failure and required bipap.    PT Comments    Session focus on activity tolerance with functional transfers.  Pt able to complete 1 rep sit<>stand with min assist, and noted to be incontinent of bowel.  Pt then progressed to requiring up to max assist for subsequent stands for hygiene and mod assist for squat/pivot to recliner on R.  PT discussed recommendation for SNF for further rehab prior to returning home with family and pt states he will consider it.      Follow Up Recommendations  SNF;Supervision/Assistance - 24 hour     Equipment Recommendations       Recommendations for Other Services       Precautions / Restrictions Precautions Precautions: Fall Precaution Comments: chronic R hemiparesis Restrictions Weight Bearing Restrictions: No    Mobility  Bed Mobility Overal bed mobility: Needs Assistance Bed Mobility: Supine to Sit     Supine to sit: Mod assist     General bed mobility comments: cues for hand placement and using railing, assist to elevate trunk to midline  Transfers Overall transfer level: Needs assistance Equipment used: Rolling walker (2 wheeled) Transfers: Sit to/from [employer]Stand;Squat Pivot Transfers Sit to Stand: Max assist(highest burden of care)   Squat pivot transfers: Mod  assist     General transfer comment: Pt completed 5 sit<>stands and 1 squat/pivot during session.  First stand was min assist from minimally elevated bed with RW, progressed to max by end of standing trials due to fatigue.   Ambulation/Gait                 Stairs            Wheelchair Mobility    Modified Rankin (Stroke Patients Only)       Balance Overall balance assessment: Needs assistance Sitting-balance support: Bilateral upper extremity supported;Feet supported Sitting balance-Leahy Scale: Fair     Standing balance support: Bilateral upper extremity supported;During functional activity Standing balance-Leahy Scale: Zero                              Cognition Arousal/Alertness: Awake/alert Behavior During Therapy: WFL for tasks assessed/performed Overall Cognitive Status: Impaired/Different from baseline Area of Impairment: Memory;Problem solving                   Current Attention Level: Sustained   Following Commands: Follows one step commands with increased time;Follows one step commands consistently Safety/Judgement: Decreased awareness of safety Awareness: Intellectual Problem Solving: Slow processing;Requires verbal cues;Requires tactile cues        Exercises      General Comments        Pertinent Vitals/Pain Pain Assessment: No/denies pain    Home Living  Prior Function            PT Goals (current goals can now be found in the care plan section) Acute Rehab PT Goals Patient Stated Goal: to get stronger PT Goal Formulation: Patient unable to participate in goal setting Time For Goal Achievement: 01/20/17 Potential to Achieve Goals: Good Progress towards PT goals: Progressing toward goals    Frequency    Min 3X/week      PT Plan Current plan remains appropriate    Co-evaluation              AM-PAC PT "6 Clicks" Daily Activity  Outcome Measure  Difficulty  turning over in bed (including adjusting bedclothes, sheets and blankets)?: A Lot Difficulty moving from lying on back to sitting on the side of the bed? : A Lot Difficulty sitting down on and standing up from a chair with arms (e.g., wheelchair, bedside commode, etc,.)?: A Lot Help needed moving to and from a bed to chair (including a wheelchair)?: A Lot Help needed walking in hospital room?: Total Help needed climbing 3-5 steps with a railing? : Total 6 Click Score: 10    End of Session Equipment Utilized During Treatment: Oxygen Activity Tolerance: Patient limited by fatigue Patient left: in chair;with call bell/phone within reach;with family/visitor present Nurse Communication: Mobility status PT Visit Diagnosis: Muscle weakness (generalized) (M62.81);Unsteadiness on feet (R26.81)     Time: 1427-1510 PT Time Calculation (min) (ACUTE ONLY): 43 min  Charges:  $Therapeutic Activity: 38-52 mins                    G Codes:       Estill Dooms, PT, DPT 01/13/17 3:18 PM

## 2017-01-13 NOTE — Progress Notes (Signed)
PROGRESS NOTE    JANET DECESARE  JSR:[id number] DOB: 1940-10-07 DOA: 12/26/2016 PCP: Ranae Palms, MD      Brief Narrative:  Mr. Fichera is a 77 y.o. W M with a hx of significant ETOH abuse; per the family ~ 4 drinks of [name] per day and occasionally several bottles a day, who presented to [facility] with weakness, exertional dyspnea  And melanotic stools for several days. Per the family, he was seen at [facility] 2 days ago; was told his Hb was low but left AMA. He was also at [facility] 2-3 weeks ago and was intubated for ETOH withdrawal.  At [facility], his H&H was 6.5/19.7, for which he was transfused prior to transfer. Other pertinent labs were: blood alcohol <10, Ammonia-19.0, ALT/AST-12/14, bili/alk phos-0.5/70. Received MVI and thiamine prior to transfer. [facility] aware of patient.     Assessment & Plan:  Principal Problem:   Acute respiratory failure with hypoxia (HCC) Active Problems:   GI bleed requiring more than 4 units of blood in 24 hours, ICU, or surgery   Melena   Antral ulcer, chronic   Acute blood loss anemia   Alcoholic cirrhosis of liver without ascites (HCC)   SOB (shortness of breath)   Fever   A-fib (HCC)   Acute diastolic CHF (congestive heart failure) (HCC)   Gastric ulcer: Per EGD 12/27/2016   Lytic lesion of bone on x-ray: Left humeral   Acute encephalopathy   UGI bleed    Acute respiratory failure with hypoxia and hypercapnia due to COPD and CHF  Acute COPD exacerbation On Bevesp, Spiriva, Symbicort at home -Start to taper prednisone tomorrow -Nebs as needed -Continue Pulmicort  Acute on chronic diastolic CHF Hypertension Still hypoxic. -Continue IV furosemide -K supplement -Strict I/Os, daily weights, telemetry  -Daily monitoring renal function -Hold losartan, amlodipine  Chronic atrial fibrillation -Continue diltiazem -Hold anticoagulation given GIB  Acute GI bleed Acute blood loss anemia -Hold  Xaretlo -Continue iron -Continue PPI  Alcohol dependence Alcohol withdrawal delirium Resolved withdrawal. -Continue thiamine  Hypokalemia Resolved  Lytic lesions of the humerus, fibula, skull -Obtain SPEP UPEP - Outpatient Hemoccult consult  Other medications -Continue Flomax          DVT prophylaxis: SCDs Code Status: DO NOT RESUSCITATE Family Communication: None at bedside Disposition Plan: To SNF, likely tomorrow.     Consultants:   GI  Procedures:  Chest x-ray 12/27/2016, 12/28/2016, 12/30/2016, 12/31/2016, 01/05/2017  2D echo 12/31/2016  Upper endoscopy 12/27/2016 per Dr. Carlean Purl, nonbleeding gastric ulcer noted  Transfused 1 unit packed red blood cells 12/29/2016  Right lower extremity Dopplers 01/03/2017--- negative for DVT, negative for Baker's cyst  Skeletal survey 01/03/2017  CT head 01/05/2017  Antimicrobials:   IV Zosyn 12/27/2016>>>> 12/28/2016  IV Zosyn 12/31/2016>>>>>> 01/04/2017  IV vancomycin 12/27/2016>>>>> 12/28/2016  zithromax 12/28=>    Subjective: Feels fine.  No orthopnea, dyuspnea, chest pain.  No cough, melena, hematochezia.  No confusion, weakness.  Objective: Vitals:   01/13/17 0526 01/13/17 0924 01/13/17 1417 01/13/17 1427  BP: 125/64 127/70  (!) 103/54  Pulse: 78 70  87  Resp: 18   16  Temp: 98 F (36.7 C)   98.3 F (36.8 C)  TempSrc: Oral   Oral  SpO2: 97%  94% 100%  Weight:      Height:        Intake/Output Summary (Last 24 hours) at 01/13/2017 1832 Last data filed at 01/13/2017 0900 Gross per 24 hour  Intake 684  ml  Output 500 ml  Net 184 ml   Filed Weights   01/11/17 0600 01/12/17 0600 01/13/17 0500  Weight: 79.7 kg (175 lb 11.3 oz) 78.9 kg (174 lb) 77.2 kg (170 lb 3.1 oz)    Examination: General appearance: Obese adult male, alert and in no acute distress.   HEENT: Anicteric, conjunctiva pink, lids and lashes normal. No nasal deformity, discharge, epistaxis.  Lips moist.   Skin: Warm and  dry.  No jaundice.  No suspicious rashes or lesions. Cardiac: RRR, nl S1-S2, no murmurs appreciated.  Capillary refill is brisk.  JVP not visible.  No LE edema.  Radial pulses 2+ and symmetric. Respiratory: Normal respiratory rate and rhythm.  CTAB without rales or wheezes. Abdomen: Abdomen soft.  No TTP. No ascites, distension, hepatosplenomegaly.   MSK: No deformities or effusions. Neuro: Awake and alert.  EOMI, moves all extremities. Speech fluent.    Psych: Sensorium intact and responding to questions, attention normal. Affect normal.  Judgment and insight appear normal.    Data Reviewed: I have personally reviewed following labs and imaging studies:  CBC: Recent Labs  Lab 01/07/17 0724 01/08/17 0357 01/09/17 0329 01/11/17 1032 01/12/17 0354  WBC 9.0 10.9* 13.4* 9.6 9.6  HGB 8.7* 9.3* 8.3* 8.7* 9.1*  HCT 28.7* 31.2* 26.7* 28.6* 29.8*  MCV 94.1 93.4 92.1 91.4 90.9  PLT 137* 195 215 258 740   Basic Metabolic Panel: Recent Labs  Lab 01/07/17 0724 01/08/17 0357 01/09/17 0329 01/11/17 1032 01/12/17 0354  NA 137 136 138 133* 134*  K 3.8 4.8 4.2 3.6 3.7  CL 92* 93* 97* 94* 94*  CO2 36* 34* 33* 32 33*  GLUCOSE 125* 191* 198* 103* 108*  BUN 15 21* 23* 21* 20  CREATININE 0.83 0.97 1.05 1.10 1.08  CALCIUM 8.3* 8.7* 8.7* 8.8* 8.8*   GFR: Estimated Creatinine Clearance: 54.4 mL/min (by C-G formula based on SCr of 1.08 mg/dL). Liver Function Tests: Recent Labs  Lab 01/08/17 0357 01/09/17 0329 01/11/17 1032 01/12/17 0354  AST 13* 12* 25 16  ALT 12* 13* 27 24  ALKPHOS 58 52 60 58  BILITOT 0.7 0.4 0.5 0.5  PROT 6.1* 5.8* 5.7* 5.6*  ALBUMIN 2.1* 2.1* 2.4* 2.3*   No results for input(s): LIPASE, AMYLASE in the last 168 hours. Recent Labs  Lab 01/11/17 1032  AMMONIA 13   Coagulation Profile: No results for input(s): INR, PROTIME in the last 168 hours. Cardiac Enzymes: No results for input(s): CKTOTAL, CKMB, CKMBINDEX, TROPONINI in the last 168 hours. BNP (last 3  results) No results for input(s): PROBNP in the last 8760 hours. HbA1C: No results for input(s): HGBA1C in the last 72 hours. CBG: Recent Labs  Lab 01/11/17 1147 01/12/17 0407  GLUCAP 90 106*   Lipid Profile: No results for input(s): CHOL, HDL, LDLCALC, TRIG, CHOLHDL, LDLDIRECT in the last 72 hours. Thyroid Function Tests: No results for input(s): TSH, T4TOTAL, FREET4, T3FREE, THYROIDAB in the last 72 hours. Anemia Panel: No results for input(s): VITAMINB12, FOLATE, FERRITIN, TIBC, IRON, RETICCTPCT in the last 72 hours. Urine analysis:    Component Value Date/Time   COLORURINE YELLOW 12/31/2016 Honor 12/31/2016 0934   LABSPEC 1.020 12/31/2016 0934   PHURINE 5.5 12/31/2016 0934   GLUCOSEU NEGATIVE 12/31/2016 0934   HGBUR MODERATE (A) 12/31/2016 0934   BILIRUBINUR NEGATIVE 12/31/2016 0934   KETONESUR 15 (A) 12/31/2016 0934   PROTEINUR NEGATIVE 12/31/2016 0934   NITRITE NEGATIVE 12/31/2016 0934   LEUKOCYTESUR NEGATIVE  12/31/2016 0934   Sepsis Labs: @LABRCNTIP (procalcitonin:4,lacticacidven:4)  )No results found for this or any previous visit (from the past 240 hour(s)).       Radiology Studies: No results found.      Scheduled Meds: . budesonide (PULMICORT) nebulizer solution  0.5 mg Nebulization BID  . dextromethorphan-guaiFENesin  2 tablet Oral BID  . diltiazem  120 mg Oral q morning - 10a  . feeding supplement (ENSURE ENLIVE)  237 mL Oral Q24H  . feeding supplement (PRO-STAT SUGAR FREE 64)  30 mL Oral BID  . ferrous sulfate  325 mg Oral Daily  . fluticasone  2 spray Each Nare Daily  . furosemide  40 mg Intravenous Q12H  . ipratropium-albuterol  3 mL Nebulization TID  . loratadine  10 mg Oral Daily  . mouth rinse  15 mL Mouth Rinse BID  . multivitamin with minerals  1 tablet Oral Daily  . nicotine  21 mg Transdermal Daily  . pantoprazole  40 mg Oral BID  . potassium chloride  40 mEq Oral Daily  . predniSONE  50 mg Oral Q breakfast  .  tamsulosin  0.4 mg Oral QPC supper  . thiamine  100 mg Oral Daily   Continuous Infusions: . sodium chloride Stopped (12/29/16 1100)     LOS: 18 days    Time spent: 45 minutes    Edwin Dada, MD [facility] 01/13/2017, 6:32 PM     Pager [phone] --- please page though AMION:  [email or web address] If 7PM-7AM, please contact night-coverage

## 2017-01-13 NOTE — Progress Notes (Addendum)
Patient presents with meanotic stools, has hx of ETOH abuse, EGD showed gastric ulcer, also has afib with rvr, acute/chronic HF.  Had Resp failiure episode requiring bipap, with confusion and required precedex.  conts on iv lasix.  Transferred to tele . Plan is SNF  Per pt recs when stable.

## 2017-01-13 NOTE — Progress Notes (Signed)
Occupational Therapy Treatment Patient Details Name: [name] [name] MRN: [id number] DOB: 1940-09-05 Today's Date: 01/13/2017    History of present illness Pt is a 77 y.o. who [facility] with weakness, exertional dyspnea, and melanotic stools and was subsequently [facility]. Pt with multiple hospitalizations in the month prior to presentation. He has a history of ETOH abuse. Pt found to have gastric ulcer and hospital course complicated by fever of unknown origin, atrial fibrillation with RVR, and acute on chronic diastolic heart failure. Pt also with lytic lesions in L humeruse, L fibula, and skull. Pt transferred to ICU on 12/28 with respiratory failure and required bipap.   OT comments  Pt progressing towards OT goals this session. Recently completed PT session with multiple sit<>stand transfers and SPT to recliner, so focus was on grooming tasks and PNF patterns challenging his core strength in prep for bathing and other essential ADL tasks. Pt very motivated and pleasant throughout session. SNF continues to be most appropriate setting for continued rehab post-acute stay. Next session to establish written HEP for BUE.   Follow Up Recommendations  SNF;Supervision/Assistance - 24 hour    Equipment Recommendations  3 in 1 bedside commode;Tub/shower seat    Recommendations for Other Services      Precautions / Restrictions Precautions Precautions: Fall Precaution Comments: chronic R hemiparesis Restrictions Weight Bearing Restrictions: No       Mobility Bed Mobility Overal bed mobility: Needs Assistance Bed Mobility: Supine to Sit     Supine to sit: Mod assist     General bed mobility comments: Pt sitting OOB in recliner when OT entered  Transfers Overall transfer level: Needs assistance Equipment used: Rolling walker (2 wheeled) Transfers: Sit to/from [employer]Stand;Squat Pivot Transfers Sit to Stand: Max assist(highest burden of care)   Squat  pivot transfers: Mod assist     General transfer comment: Pt completed 5 sit<>stands and 1 squat/pivot during session.  First stand was min assist from minimally elevated bed with RW, progressed to max by end of standing trials due to fatigue.     Balance Overall balance assessment: Needs assistance Sitting-balance support: Bilateral upper extremity supported;Feet supported Sitting balance-Leahy Scale: Fair     Standing balance support: Bilateral upper extremity supported;During functional activity Standing balance-Leahy Scale: Zero                             ADL either performed or assessed with clinical judgement   ADL Overall ADL's : Needs assistance/impaired     Grooming: Wash/dry hands;Wash/dry face;Oral care;Moderate assistance;Sitting Grooming Details (indicate cue type and reason): in recliner                                     Vision       Perception     Praxis      Cognition Arousal/Alertness: Awake/alert Behavior During Therapy: WFL for tasks assessed/performed Overall Cognitive Status: Impaired/Different from baseline Area of Impairment: Memory;Problem solving                   Current Attention Level: Sustained   Following Commands: Follows one step commands with increased time;Follows one step commands consistently Safety/Judgement: Decreased awareness of safety Awareness: Intellectual Problem Solving: Slow processing;Requires verbal cues;Requires tactile cues          Exercises General Exercises - Lower Extremity Ankle  Circles/Pumps: AAROM;5 reps;Both Heel Slides: AROM;AAROM;Both;10 reps Other Exercises Other Exercises: Facilitated improved B UE and core strength with functional reaching tasks in PNF patterns engaging balance systems to right self and core strength to raise trunk from back of chair.    Shoulder Instructions       General Comments      Pertinent Vitals/ Pain       Pain Assessment:  No/denies pain  Home Living                                          Prior Functioning/Environment              Frequency  Min 2X/week        Progress Toward Goals  OT Goals(current goals can now be found in the care plan section)  Progress towards OT goals: Progressing toward goals  Acute Rehab OT Goals Patient Stated Goal: to get stronger OT Goal Formulation: With patient Time For Goal Achievement: 01/19/17 Potential to Achieve Goals: Fair  Plan Discharge plan remains appropriate    Co-evaluation                 AM-PAC PT "6 Clicks" Daily Activity     Outcome Measure   Help from another person eating meals?: A Little Help from another person taking care of personal grooming?: A Little Help from another person toileting, which includes using toliet, bedpan, or urinal?: Total Help from another person bathing (including washing, rinsing, drying)?: Total Help from another person to put on and taking off regular upper body clothing?: A Lot Help from another person to put on and taking off regular lower body clothing?: Total 6 Click Score: 11    End of Session    OT Visit Diagnosis: Other abnormalities of gait and mobility (R26.89);Muscle weakness (generalized) (M62.81)   Activity Tolerance Patient tolerated treatment well   Patient Left in chair;with call bell/phone within reach;with chair alarm set   Nurse Communication Mobility status(NT to re-apply condom cath)        Time: 1610-9604 OT Time Calculation (min): 21 min  Charges: OT General Charges $OT Visit: 1 Visit OT Treatments $Self Care/Home Management : 8-22 mins  Sherryl Manges OTR/L [phone]  [name] [name] 01/13/2017, 6:10 PM

## 2017-01-14 LAB — CBC
HCT: 27.9 % — ABNORMAL LOW (ref 39.0–52.0)
Hemoglobin: 8.7 g/dL — ABNORMAL LOW (ref 13.0–17.0)
MCH: 27.5 pg (ref 26.0–34.0)
MCHC: 31.2 g/dL (ref 30.0–36.0)
MCV: 88.3 fL (ref 78.0–100.0)
PLATELETS: 274 10*3/uL (ref 150–400)
RBC: 3.16 MIL/uL — AB (ref 4.22–5.81)
RDW: 16.5 % — AB (ref 11.5–15.5)
WBC: 13.6 10*3/uL — AB (ref 4.0–10.5)

## 2017-01-14 LAB — BASIC METABOLIC PANEL
Anion gap: 8 (ref 5–15)
BUN: 31 mg/dL — ABNORMAL HIGH (ref 6–20)
CHLORIDE: 91 mmol/L — AB (ref 101–111)
CO2: 33 mmol/L — AB (ref 22–32)
Calcium: 8.6 mg/dL — ABNORMAL LOW (ref 8.9–10.3)
Creatinine, Ser: 1.07 mg/dL (ref 0.61–1.24)
GFR calc non Af Amer: >60 mL/min (ref >60)
GLUCOSE: 118 mg/dL — AB (ref 65–99)
POTASSIUM: 3.3 mmol/L — AB (ref 3.5–5.1)
Sodium: 132 mmol/L — ABNORMAL LOW (ref 135–145)

## 2017-01-14 LAB — PROCALCITONIN

## 2017-01-14 MED ORDER — IPRATROPIUM-ALBUTEROL 0.5-2.5 (3) MG/3ML IN SOLN
3.0000 mL | Freq: Four times a day (QID) | RESPIRATORY_TRACT | Status: DC
  Administered 2017-01-14: 3 mL via RESPIRATORY_TRACT
  Filled 2017-01-14: qty 3

## 2017-01-14 MED ORDER — MAGNESIUM OXIDE 400 (241.3 MG) MG PO TABS: 400.0000 mg | ORAL_TABLET | Freq: Every evening | ORAL | Status: DC | PRN

## 2017-01-14 MED ORDER — METHYLPREDNISOLONE SODIUM SUCC 40 MG IJ SOLR
40.0000 mg | Freq: Two times a day (BID) | INTRAMUSCULAR | Status: DC
  Administered 2017-01-14 – 2017-01-17 (×7): 40 mg via INTRAVENOUS
  Filled 2017-01-14 (×7): qty 1

## 2017-01-14 MED ORDER — ALBUTEROL SULFATE (2.5 MG/3ML) 0.083% IN NEBU: 2.5000 mg | INHALATION_SOLUTION | RESPIRATORY_TRACT | Status: DC | PRN

## 2017-01-14 MED ORDER — NON FORMULARY: 2.0000 mg | Freq: Every evening | Status: DC | PRN

## 2017-01-14 MED ORDER — MELATONIN 3 MG PO TABS
3.0000 mg | ORAL_TABLET | Freq: Every evening | ORAL | Status: DC | PRN
  Administered 2017-01-14 – 2017-01-15 (×2): 3 mg via ORAL
  Filled 2017-01-14 (×3): qty 1

## 2017-01-14 NOTE — Progress Notes (Signed)
Physical Therapy Treatment Patient Details Name: [name] [name] MRN: [id number] DOB: 08-Sep-1940 Today's Date: 01/14/2017    History of Present Illness Pt is a 77 y.o. who presented to [facility] with weakness, exertional dyspnea, and melanotic stools and was subsequently transferred to [facility] on 12/26/16. Pt with multiple hospitalizations in the month prior to presentation. He has a history of ETOH abuse. Pt found to have gastric ulcer and hospital course complicated by fever of unknown origin, atrial fibrillation with RVR, and acute on chronic diastolic heart failure. Pt also with lytic lesions in L humeruse, L fibula, and skull. Pt transferred to ICU on 12/28 with respiratory failure and required bipap.    PT Comments    On arrival to room pt dozing and difficult to rouse. VSS. Pt with eyes open after several attempts to wake and came fully awake after ~3 min. Pt required mod A for bed mobilities and for transfer to recliner chair. Uncontrolled descent into chair as pt fatigued. SpO2 at 85% after transfer and on 2 L O2. Returned to >90% with cues for pursed lipped breathing. Spoke with pt about benefits of SNF placement vs returning home. Pt agreeable to SNF. He would benefit from continued skilled PT to increase safety with mobility and activity tolerance. Will continue to follow acutely.    Follow Up Recommendations  SNF;Supervision/Assistance - 24 hour     Equipment Recommendations  Wheelchair (measurements PT);Wheelchair cushion (measurements PT)    Recommendations for Other Services       Precautions / Restrictions Precautions Precautions: Fall Precaution Comments: chronic R hemiparesis Restrictions Weight Bearing Restrictions: No    Mobility  Bed Mobility Overal bed mobility: Needs Assistance Bed Mobility: Rolling;Sidelying to Sit Rolling: Mod assist Sidelying to sit: Mod assist       General bed mobility comments: Pt able to initiate movement with cueing. Mod A for LE  management, trunk elevation, and to progress hips to EOB.  Transfers Overall transfer level: Needs assistance Equipment used: Rolling walker (2 wheeled) Transfers: Sit to/from [employer] Sit to Stand: Mod assist;From elevated surface Stand pivot transfers: Mod assist       General transfer comment: Pt able to rise into standing with mod A from elevated surface and cueing for technique. Mod A also required for stand pivot to chair with RW. Pt fatigueing at end of pivot and had uncontrolled descent into chair. Pt reported feeling very weak after transfer. SpO2 at 85% on 2 L O2. VC for pursed lipped breathing. Pt SpO2 returned to 93%.  Ambulation/Gait             General Gait Details: Not performed patient will need to improve standing tolerance and posture to progress to functional gait training.     Stairs            Wheelchair Mobility    Modified Rankin (Stroke Patients Only) Modified Rankin (Stroke Patients Only) Pre-Morbid Rankin Score: No symptoms Modified Rankin: Severe disability     Balance Overall balance assessment: Needs assistance Sitting-balance support: Bilateral upper extremity supported;Feet supported Sitting balance-Leahy Scale: Fair Sitting balance - Comments: Pt able to sit EOB for ~3 min with BIL UE support, but no physical assist. Postural control: Left lateral lean Standing balance support: Bilateral upper extremity supported;During functional activity Standing balance-Leahy Scale: Poor Standing balance comment: Pt dependent on RW and required mod A with standing activities.  Cognition Arousal/Alertness: Awake/alert Behavior During Therapy: WFL for tasks assessed/performed Overall Cognitive Status: Impaired/Different from baseline Area of Impairment: Memory;Problem solving                     Memory: Decreased short-term memory Following Commands: Follows one step commands with  increased time;Follows one step commands consistently Safety/Judgement: Decreased awareness of safety Awareness: Intellectual Problem Solving: Slow processing;Requires verbal cues;Requires tactile cues        Exercises      General Comments General comments (skin integrity, edema, etc.): Wife and family present during session. Discussed with pt and family the benefits of SNF vs returning home. Pt agreeable to SNF.      Pertinent Vitals/Pain Pain Assessment: No/denies pain    Home Living                      Prior Function            PT Goals (current goals can now be found in the care plan section) Acute Rehab PT Goals Patient Stated Goal: to get stronger PT Goal Formulation: Patient unable to participate in goal setting Time For Goal Achievement: 01/20/17 Potential to Achieve Goals: Good Progress towards PT goals: Progressing toward goals    Frequency    Min 3X/week      PT Plan Current plan remains appropriate    Co-evaluation              AM-PAC PT "6 Clicks" Daily Activity  Outcome Measure  Difficulty turning over in bed (including adjusting bedclothes, sheets and blankets)?: Unable Difficulty moving from lying on back to sitting on the side of the bed? : Unable Difficulty sitting down on and standing up from a chair with arms (e.g., wheelchair, bedside commode, etc,.)?: Unable Help needed moving to and from a bed to chair (including a wheelchair)?: A Lot Help needed walking in hospital room?: Total Help needed climbing 3-5 steps with a railing? : Total 6 Click Score: 7    End of Session Equipment Utilized During Treatment: Oxygen;Gait belt Activity Tolerance: Patient limited by fatigue Patient left: in chair;with call bell/phone within reach;with family/visitor present Nurse Communication: Mobility status PT Visit Diagnosis: Muscle weakness (generalized) (M62.81);Unsteadiness on feet (R26.81) Hemiplegia - Right/Left: Right Hemiplegia -  dominant/non-dominant: Dominant Hemiplegia - caused by: Unspecified     Time: [id number] PT Time Calculation (min) (ACUTE ONLY): 23 min  Charges:  $Therapeutic Activity: 23-37 mins                    G Codes:      Kallie LocksHannah Dreana Britz, [place]PTA Pager [phone] Acute Rehab  Sheral ApleyHannah E Aleathea Pugmire 01/14/2017, 2:11 PM

## 2017-01-14 NOTE — Progress Notes (Signed)
01/14/17 1600  PT Visit Information  Last PT Received On 01/14/17  Assistance Needed +1 (would need +2 for gait)  PT/OT/SLP Co-Evaluation/Treatment Yes  Reason Eval/Treat Not Completed Patient's level of consciousness  History of Present Illness Pt is a 77 y.o. who presented to [facility] with weakness, exertional dyspnea, and melanotic stools and was subsequently transferred to [facility] on 12/26/16. Pt with multiple hospitalizations in the month prior to presentation. He has a history of ETOH abuse. Pt found to have gastric ulcer and hospital course complicated by fever of unknown origin, atrial fibrillation with RVR, and acute on chronic diastolic heart failure. Pt also with lytic lesions in L humeruse, L fibula, and skull. Pt transferred to ICU on 12/28 with respiratory failure and required bipap.  Subjective Data  Patient Stated Goal to get stronger  Precautions  Precautions Fall  Precaution Comments chronic R hemiparesis  Restrictions  Weight Bearing Restrictions No  Pain Assessment  Pain Assessment No/denies pain  Bed Mobility  Overal bed mobility Needs Assistance  Bed Mobility Sit to Supine  Sit to supine Mod assist;+2 for physical assistance  General bed mobility comments Mod A to return to supine and scoot up in bed  Transfers  Overall transfer level Needs assistance  Equipment used Rolling walker (2 wheeled)  Transfers Sit to/from [employer] Transfers  Sit to Stand Mod assist;+2 physical assistance  Stand pivot transfers Mod assist;+2 physical assistance  General transfer comment Mod A +2 to transfer from recliner to EOB. Pt fatigues quickly and unable to control descent onto bed.  Modified Rankin (Stroke Patients Only)  Pre-Morbid Rankin Score 0  Modified Rankin 5  Balance  Overall balance assessment Needs assistance  Sitting-balance support Bilateral upper extremity supported;Feet supported  Sitting balance-Leahy Scale Fair  Standing balance support Bilateral  upper extremity supported;During functional activity  Standing balance-Leahy Scale Poor  Standing balance comment Pt dependent on RW and required mod A with standing activities.  PT - End of Session  Equipment Utilized During Treatment Oxygen;Gait belt  Activity Tolerance Patient tolerated treatment well  Patient left with call bell/phone within reach;with family/visitor present;in bed  Nurse Communication Mobility status  PT - Assessment/Plan  PT Plan Current plan remains appropriate  PT Visit Diagnosis Muscle weakness (generalized) (M62.81);Unsteadiness on feet (R26.81)  Hemiplegia - Right/Left Right  Hemiplegia - dominant/non-dominant Dominant  Hemiplegia - caused by Unspecified  PT Frequency (ACUTE ONLY) Min 3X/week  Follow Up Recommendations SNF;Supervision/Assistance - 24 hour  PT equipment Wheelchair (measurements PT);Wheelchair cushion (measurements PT)  AM-PAC PT "6 Clicks" Daily Activity Outcome Measure  Difficulty turning over in bed (including adjusting bedclothes, sheets and blankets)? 1  Difficulty moving from lying on back to sitting on the side of the bed?  1  Difficulty sitting down on and standing up from a chair with arms (e.g., wheelchair, bedside commode, etc,.)? 1  Help needed moving to and from a bed to chair (including a wheelchair)? 2  Help needed walking in hospital room? 1  Help needed climbing 3-5 steps with a railing?  1  6 Click Score 7  Mobility G Code  CM  PT Goal Progression  Progress towards PT goals Progressing toward goals  Acute Rehab PT Goals  PT Goal Formulation Patient unable to participate in goal setting  Time For Goal Achievement 01/20/17  Potential to Achieve Goals Good  PT Time Calculation  PT Start Time (ACUTE ONLY) 1532  PT Stop Time (ACUTE ONLY) 1540  PT Time Calculation (min) (  ACUTE ONLY) 8 min  PT General Charges  $$ ACUTE PT VISIT 1 Visit  PT Treatments  $Therapeutic Activity 8-22 mins   Pt fatigued from sitting up for 1.5  hrs and requesting to return to bed. RN requesting PTA's assist to transfer pt. Pt required mod A +2 for pivot from recliner to bed and for bed mobilities to return to supine. Will continue to follow to maximize activity tolerance and functional independence.   Kallie LocksHannah Hadlea Furuya, PTA Pager [phone] Acute Rehab

## 2017-01-14 NOTE — NC FL2 (Signed)
[place] MEDICAID FL2 LEVEL OF CARE SCREENING TOOL     IDENTIFICATION  Patient Name: [name] [name] Birthdate: 1940-05-04 Sex: male Admission Date (Current Location): 12/26/2016  [facility] and [place]:  [place]   Facility and Address:  [facility], [address] [place], [place] [address]      Provider Number: [id number]  Attending Physician Name and Address:  Alberteen Sam, *  Relative Name and Phone Number:  Ander Slade, spouse, [phone]    Current Level of Care: Hospital Recommended Level of Care: Skilled Nursing Facility Prior Approval Number:    Date Approved/Denied:   PASRR Number:    Discharge Plan: SNF    Current Diagnoses: Patient Active Problem List   Diagnosis Date Noted  . Fever 01/02/2017  . A-fib (HCC) 01/02/2017  . Acute diastolic CHF (congestive heart failure) (HCC) 01/02/2017  . Gastric ulcer: Per EGD 12/27/2016 01/02/2017  . Lytic lesion of bone on x-ray: Left humeral 01/02/2017  . Acute encephalopathy 01/02/2017  . UGI bleed   . Acute respiratory failure with hypoxia (HCC)   . SOB (shortness of breath)   . Acute blood loss anemia   . Alcoholic cirrhosis of liver without ascites (HCC)   . Melena   . Antral ulcer, chronic   . GI bleed requiring more than 4 units of blood in 24 hours, ICU, or surgery 12/26/2016    Orientation RESPIRATION BLADDER Height & Weight     Place, Self  O2(Nasal cannula 2L) Incontinent, External catheter Weight: 75.6 kg (166 lb 10.7 oz) Height:  5\' 7"  (170.2 cm)  BEHAVIORAL SYMPTOMS/MOOD NEUROLOGICAL BOWEL NUTRITION STATUS      Incontinent Diet(Please see DC Summary)  AMBULATORY STATUS COMMUNICATION OF NEEDS Skin   Extensive Assist Verbally PU Stage and Appropriate Care, Other (Comment)(Deep tissue injury on sacrum; Stage II on buttocks;wound on thigh)                       Personal Care Assistance Level of Assistance  Bathing, Feeding, Dressing Bathing Assistance:  Maximum assistance Feeding assistance: Limited assistance Dressing Assistance: Limited assistance     Functional Limitations Info  Hearing   Hearing Info: Impaired      SPECIAL CARE FACTORS FREQUENCY  PT (By licensed PT), OT (By licensed OT)     PT Frequency: 5x/week OT Frequency: 3x/week            Contractures      Additional Factors Info  Code Status, Allergies, Psychotropic Code Status Info: DNR Allergies Info: NKA Psychotropic Info: Trazadone         Current Medications (01/14/2017):  This is the current hospital active medication list Current Facility-Administered Medications  Medication Dose Route Frequency Provider Last Rate Last Dose  . 0.9 %  sodium chloride infusion  250 mL Intravenous PRN Netty Starring, MD   Stopped at 12/29/16 1100  . acetaminophen (TYLENOL) suppository 325 mg  325 mg Rectal Q4H PRN Penny Pia, MD      . acetaminophen (TYLENOL) tablet 650 mg  650 mg Oral Q6H PRN Penny Pia, MD   650 mg at 01/09/17 2322  . budesonide (PULMICORT) nebulizer solution 0.5 mg  0.5 mg Nebulization BID Rodolph Bong, MD   0.5 mg at 01/14/17 5621  . dextromethorphan-guaiFENesin (MUCINEX DM) 30-600 MG per 12 hr tablet 2 tablet  2 tablet Oral BID Rodolph Bong, MD   2 tablet at 01/13/17 2232  . diltiazem (CARDIZEM CD) 24  hr capsule 120 mg  120 mg Oral q morning - 10a Rodolph Bonghompson, Daniel V, MD   120 mg at 01/13/17 [id number]  . feeding supplement (ENSURE ENLIVE) (ENSURE ENLIVE) liquid 237 mL  237 mL Oral Q24H Alberteen Samanford, Christopher P, MD   237 mL at 01/13/17 1307  . feeding supplement (PRO-STAT SUGAR FREE 64) liquid 30 mL  30 mL Oral BID Pearson GrippeKim, James, MD   30 mL at 01/13/17 2232  . ferrous sulfate tablet 325 mg  325 mg Oral Daily Dianah FieldGribbin, Sarah J, PA-C   325 mg at 01/13/17 [id number]  . fluticasone (FLONASE) 50 MCG/ACT nasal spray 2 spray  2 spray Each Nare Daily Rodolph Bonghompson, Daniel V, MD   2 spray at 01/13/17 0929  . furosemide (LASIX) injection 40 mg  40 mg Intravenous Q12H  Dow AdolphHall, Carole N, DO   40 mg at 01/13/17 2232  . ipratropium-albuterol (DUONEB) 0.5-2.5 (3) MG/3ML nebulizer solution 3 mL  3 mL Nebulization Q2H PRN Rodolph Bonghompson, Daniel V, MD      . ipratropium-albuterol (DUONEB) 0.5-2.5 (3) MG/3ML nebulizer solution 3 mL  3 mL Nebulization TID Pearson GrippeKim, James, MD   3 mL at 01/14/17 [id number]  . loratadine (CLARITIN) tablet 10 mg  10 mg Oral Daily Rodolph Bonghompson, Daniel V, MD   10 mg at 01/13/17 [id number]  . MEDLINE mouth rinse  15 mL Mouth Rinse BID Pearson GrippeKim, James, MD   15 mL at 01/12/17 2229  . morphine 4 MG/ML injection 2 mg  2 mg Intravenous Q3H PRN Rodolph Bonghompson, Daniel V, MD   2 mg at 01/06/17 1818  . multivitamin with minerals tablet 1 tablet  1 tablet Oral Daily Mannam, Praveen, MD   1 tablet at 01/13/17 0927  . nicotine (NICODERM CQ - dosed in mg/24 hours) patch 21 mg  21 mg Transdermal Daily Tobey GrimEubanks, Katalina M, NP   21 mg at 01/13/17 0926  . pantoprazole (PROTONIX) EC tablet 40 mg  40 mg Oral BID Pearson GrippeKim, James, MD   40 mg at 01/13/17 2232  . potassium chloride SA (K-DUR,KLOR-CON) CR tablet 40 mEq  40 mEq Oral Daily Rodolph Bonghompson, Daniel V, MD   40 mEq at 01/13/17 0929  . predniSONE (DELTASONE) tablet 40 mg  40 mg Oral Q breakfast Danford, Earl Liteshristopher P, MD      . tamsulosin (FLOMAX) capsule 0.4 mg  0.4 mg Oral QPC supper Mannam, Praveen, MD   0.4 mg at 01/13/17 1710  . thiamine (VITAMIN B-1) tablet 100 mg  100 mg Oral Daily Mannam, Praveen, MD   100 mg at 01/13/17 0928  . traZODone (DESYREL) tablet 50 mg  50 mg Oral QHS Audrea MuscatBlount, Xenia T, NP   50 mg at 01/13/17 2232     Discharge Medications: Please see discharge summary for a list of discharge medications.  Relevant Imaging Results:  Relevant Lab Results:   Additional Information SSN: [id number]3095  Ashwini Jago S GrovelandRayyan, ConnecticutLCSWA

## 2017-01-14 NOTE — Progress Notes (Signed)
PROGRESS NOTE    [name] [name]  DUK:[id number] DOB: [birth year] DOA: 12/26/2016 PCP: Ranae Palms, MD      Brief Narrative:  [name] is a 77 y.o. W M with a hx of significant ETOH abuse; per the family ~ 4 drinks of [name] per day and occasionally several bottles a day, who presented to [facility] with weakness, exertional dyspnea  And melanotic stools for several days. Per the family, he was seen at [facility] 2 days ago; was told his Hb was low but left AMA. He was also at [facility] 2-3 weeks ago and was intubated for ETOH withdrawal.  At [facility], his H&H was 6.5/19.7, for which he was transfused prior to transfer. Other pertinent labs were: blood alcohol <10, Ammonia-19.0, ALT/AST-12/14, bili/alk phos-0.5/70. Received MVI and thiamine prior to transfer. [facility] aware of patient.     Assessment & Plan:  Principal Problem:   Acute respiratory failure with hypoxia (HCC) Active Problems:   GI bleed requiring more than 4 units of blood in 24 hours, ICU, or surgery   Melena   Antral ulcer, chronic   Acute blood loss anemia   Alcoholic cirrhosis of liver without ascites (HCC)   SOB (shortness of breath)   Fever   A-fib (HCC)   Acute diastolic CHF (congestive heart failure) (HCC)   Gastric ulcer: Per EGD 12/27/2016   Lytic lesion of bone on x-ray: Left humeral   Acute encephalopathy   UGI bleed    Acute respiratory failure with hypoxia and hypercapnia due to COPD and CHF  Acute COPD exacerbation On Bevesp, Spiriva, Symbicort at home -Increase prednisone back to BID tomorrow -Nebs shceduled and as needed -Continue Pulmicort  Acute on chronic diastolic CHF Hypertension With ambulation today, patient desaturated to 60s and 80s, took a while to get sats back. -Continue IV furosemide -K supplement -Strict I/Os, daily weights, telemetry  -Daily monitoring renal function -Hold losartan, amlodipine  Chronic atrial fibrillation -Continue  diltiazem -Hold anticoagulation given GIB  Acute GI bleed Acute blood loss anemia -Hold Xaretlo -Continue iron -Continue PPI  Alcohol dependence Alcohol withdrawal delirium Family state his alcohol use is more like 1-2 drinks per day.  Resolved withdrawal. -Continue thiamine  Hypokalemia Resolved  Lytic lesions of the humerus, fibula, skull -Obtain SPEP UPEP -Outpatient HemoOnc consult  Other medications -Continue Flomax          DVT prophylaxis: SCDs Code Status: DO NOT RESUSCITATE Family Communication: Wife and son at bedside Disposition Plan: Still markeldy hypoxic.  WIll continue IV lasix, likely two more days then to SNF     Consultants:   GI  Procedures:  Chest x-ray 12/27/2016, 12/28/2016, 12/30/2016, 12/31/2016, 01/05/2017  2D echo 12/31/2016  Upper endoscopy 12/27/2016 per Dr. Carlean Purl, nonbleeding gastric ulcer noted  Transfused 1 unit packed red blood cells 12/29/2016  Right lower extremity Dopplers 01/03/2017--- negative for DVT, negative for Baker's cyst  Skeletal survey 01/03/2017  CT head 01/05/2017  Antimicrobials:   IV Zosyn 12/27/2016>>>> 12/28/2016  IV Zosyn 12/31/2016>>>>>> 01/04/2017  IV vancomycin 12/27/2016>>>>> 12/28/2016  zithromax 12/28=>    Subjective: Feels better.  Very sleepy. SOB with exertion, wheezy.  No orthopnea, chest pain.  No cough, melena, hematochezia.  No confusion, weakness.  Objective: Vitals:   01/14/17 0935 01/14/17 1537 01/14/17 1550 01/14/17 1606  BP: 137/71  (!) 125/53 (!) 119/57  Pulse:   79 74  Resp:   (!) 22 16  Temp:   98.4 F (36.9 C) 98.2 F (  36.8 C)  TempSrc:   Oral Oral  SpO2:  95% 94% 93%  Weight:      Height:        Intake/Output Summary (Last 24 hours) at 01/14/2017 1852 Last data filed at 01/14/2017 1500 Gross per 24 hour  Intake 1080 ml  Output 1400 ml  Net -320 ml   Filed Weights   01/12/17 0600 01/13/17 0500 01/14/17 0454  Weight: 78.9 kg (174 lb) 77.2 kg (170  lb 3.1 oz) 75.6 kg (166 lb 10.7 oz)    Examination: General appearance: Obese adult male, alert and in no acute distress.   HEENT: Anicteric, conjunctiva pink, lids and lashes normal. No nasal deformity, discharge, epistaxis.  Lips moist.   Skin: Warm and dry.  No jaundice.  No suspicious rashes or lesions. Cardiac: RRR, nl S1-S2, no murmurs appreciated.  Capillary refill is brisk.  JVP not visible.  No LE edema.  Radial pulses 2+ and symmetric. Respiratory: Normal respiratory rate and rhythm.  Very wheezy, coarese throughout. Abdomen: Abdomen soft.  No TTP. No ascites, distension, hepatosplenomegaly.   MSK: No deformities or effusions. Neuro: Awake and alert.  EOMI, moves all extremities. Speech fluent.    Psych: Sensorium intact and responding to questions, attention normal. Affect normal.  Judgment and insight appear normal.    Data Reviewed: I have personally reviewed following labs and imaging studies:  CBC: Recent Labs  Lab 01/08/17 0357 01/09/17 0329 01/11/17 1032 01/12/17 0354 01/14/17 0405  WBC 10.9* 13.4* 9.6 9.6 13.6*  HGB 9.3* 8.3* 8.7* 9.1* 8.7*  HCT 31.2* 26.7* 28.6* 29.8* 27.9*  MCV 93.4 92.1 91.4 90.9 88.3  PLT 195 215 258 261 786   Basic Metabolic Panel: Recent Labs  Lab 01/08/17 0357 01/09/17 0329 01/11/17 1032 01/12/17 0354 01/14/17 0405  NA 136 138 133* 134* 132*  K 4.8 4.2 3.6 3.7 3.3*  CL 93* 97* 94* 94* 91*  CO2 34* 33* 32 33* 33*  GLUCOSE 191* 198* 103* 108* 118*  BUN 21* 23* 21* 20 31*  CREATININE 0.97 1.05 1.10 1.08 1.07  CALCIUM 8.7* 8.7* 8.8* 8.8* 8.6*   GFR: Estimated Creatinine Clearance: 54.9 mL/min (by C-G formula based on SCr of 1.07 mg/dL). Liver Function Tests: Recent Labs  Lab 01/08/17 0357 01/09/17 0329 01/11/17 1032 01/12/17 0354  AST 13* 12* 25 16  ALT 12* 13* 27 24  ALKPHOS 58 52 60 58  BILITOT 0.7 0.4 0.5 0.5  PROT 6.1* 5.8* 5.7* 5.6*  ALBUMIN 2.1* 2.1* 2.4* 2.3*   No results for input(s): LIPASE, AMYLASE in the  last 168 hours. Recent Labs  Lab 01/11/17 1032  AMMONIA 13   Coagulation Profile: No results for input(s): INR, PROTIME in the last 168 hours. Cardiac Enzymes: No results for input(s): CKTOTAL, CKMB, CKMBINDEX, TROPONINI in the last 168 hours. BNP (last 3 results) No results for input(s): PROBNP in the last 8760 hours. HbA1C: No results for input(s): HGBA1C in the last 72 hours. CBG: Recent Labs  Lab 01/11/17 1147 01/12/17 0407  GLUCAP 90 106*   Lipid Profile: No results for input(s): CHOL, HDL, LDLCALC, TRIG, CHOLHDL, LDLDIRECT in the last 72 hours. Thyroid Function Tests: No results for input(s): TSH, T4TOTAL, FREET4, T3FREE, THYROIDAB in the last 72 hours. Anemia Panel: No results for input(s): VITAMINB12, FOLATE, FERRITIN, TIBC, IRON, RETICCTPCT in the last 72 hours. Urine analysis:    Component Value Date/Time   COLORURINE YELLOW 12/31/2016 [place] 12/31/2016 0934   LABSPEC 1.020 12/31/2016  [place] 5.5 12/31/2016 0934   GLUCOSEU NEGATIVE 12/31/2016 0934   HGBUR MODERATE (A) 12/31/2016 0934   BILIRUBINUR NEGATIVE 12/31/2016 0934   KETONESUR 15 (A) 12/31/2016 0934   PROTEINUR NEGATIVE 12/31/2016 0934   NITRITE NEGATIVE 12/31/2016 0934   LEUKOCYTESUR NEGATIVE 12/31/2016 0934   Sepsis Labs: @LABRCNTIP (procalcitonin:4,lacticacidven:4)  )No results found for this or any previous visit (from the past 240 hour(s)).       Radiology Studies: No results found.      Scheduled Meds: . budesonide (PULMICORT) nebulizer solution  0.5 mg Nebulization BID  . dextromethorphan-guaiFENesin  2 tablet Oral BID  . diltiazem  120 mg Oral q morning - 10a  . feeding supplement (ENSURE ENLIVE)  237 mL Oral Q24H  . feeding supplement (PRO-STAT SUGAR FREE 64)  30 mL Oral BID  . ferrous sulfate  325 mg Oral Daily  . fluticasone  2 spray Each Nare Daily  . furosemide  40 mg Intravenous Q12H  . ipratropium-albuterol  3 mL Nebulization Q6H  . loratadine   10 mg Oral Daily  . mouth rinse  15 mL Mouth Rinse BID  . methylPREDNISolone (SOLU-MEDROL) injection  40 mg Intravenous Q12H  . multivitamin with minerals  1 tablet Oral Daily  . nicotine  21 mg Transdermal Daily  . pantoprazole  40 mg Oral BID  . potassium chloride  40 mEq Oral Daily  . tamsulosin  0.4 mg Oral QPC supper  . thiamine  100 mg Oral Daily  . traZODone  50 mg Oral QHS   Continuous Infusions: . sodium chloride Stopped (12/29/16 1100)     LOS: 19 days    Time spent: 45 minutes    Edwin Dada, MD [facility] 01/14/2017, 6:52 PM     Pager [phone] --- please page though AMION:  [email or web address] Password [email or web address] If 7PM-7AM, please contact night-coverage

## 2017-01-14 NOTE — Progress Notes (Signed)
CSW spoke with patient's wife regarding SNF recommendation again. She states that it would be helpful for the patient to go to rehab for a little while, but she does not think he will be willing to go. She states that he went to [facility] one time before and signed himself out the same day. CSW explained that since patient is disoriented, she is able to make his decisions at this time and that it is unlikely patient will walk out of the facility due to his weakness. She agrees but would still like to come speak with him to convince him to go to rehab. She wants him to want to go. CSW will continue to follow and is checking with [facility] for availability.   Osborne Cascoadia Tosha Belgarde LCSWA [phone]

## 2017-01-14 NOTE — Progress Notes (Signed)
SATURATION QUALIFICATIONS: (This note is used to comply with regulatory documentation for home oxygen)  Patient Saturations on Room Air at Rest = 81%  Patient Saturations on Room Air while Ambulating = Unable to ambulate, too unsteady and oxygen sats to low.  Patient Saturations on 3 Liters of oxygen to get back to 94%  Please briefly explain why patient needs home oxygen:  - patient unable to ambulate due to oxygen saturations being too low.

## 2017-01-15 LAB — BLOOD GAS, ARTERIAL
ACID-BASE EXCESS: 7.4 mmol/L — AB (ref 0.0–2.0)
Bicarbonate: 31.5 mmol/L — ABNORMAL HIGH (ref 20.0–28.0)
DRAWN BY: 246861
FIO2: 21
O2 SAT: 86.7 %
PATIENT TEMPERATURE: 98.6
pCO2 arterial: 45.7 mmHg (ref 32.0–48.0)
pH, Arterial: 7.453 — ABNORMAL HIGH (ref 7.350–7.450)
pO2, Arterial: 51.9 mmHg — ABNORMAL LOW (ref 83.0–108.0)

## 2017-01-15 LAB — CBC
HCT: 30.9 % — ABNORMAL LOW (ref 39.0–52.0)
Hemoglobin: 9.4 g/dL — ABNORMAL LOW (ref 13.0–17.0)
MCH: 27.2 pg (ref 26.0–34.0)
MCHC: 30.4 g/dL (ref 30.0–36.0)
MCV: 89.6 fL (ref 78.0–100.0)
PLATELETS: 266 10*3/uL (ref 150–400)
RBC: 3.45 MIL/uL — ABNORMAL LOW (ref 4.22–5.81)
RDW: 16.6 % — AB (ref 11.5–15.5)
WBC: 15.4 10*3/uL — AB (ref 4.0–10.5)

## 2017-01-15 LAB — BASIC METABOLIC PANEL
Anion gap: 8 (ref 5–15)
BUN: 28 mg/dL — ABNORMAL HIGH (ref 6–20)
CALCIUM: 8.7 mg/dL — AB (ref 8.9–10.3)
CO2: 33 mmol/L — ABNORMAL HIGH (ref 22–32)
CREATININE: 1 mg/dL (ref 0.61–1.24)
Chloride: 92 mmol/L — ABNORMAL LOW (ref 101–111)
GFR calc Af Amer: >60 mL/min (ref >60)
GLUCOSE: 211 mg/dL — AB (ref 65–99)
POTASSIUM: 4.2 mmol/L (ref 3.5–5.1)
SODIUM: 133 mmol/L — AB (ref 135–145)

## 2017-01-15 MED ORDER — FUROSEMIDE 10 MG/ML IJ SOLN
40.0000 mg | Freq: Two times a day (BID) | INTRAMUSCULAR | Status: DC
  Administered 2017-01-15 (×2): 40 mg via INTRAVENOUS
  Filled 2017-01-15 (×2): qty 4

## 2017-01-15 MED ORDER — IPRATROPIUM-ALBUTEROL 0.5-2.5 (3) MG/3ML IN SOLN
3.0000 mL | Freq: Three times a day (TID) | RESPIRATORY_TRACT | Status: DC
  Administered 2017-01-15 – 2017-01-17 (×8): 3 mL via RESPIRATORY_TRACT
  Filled 2017-01-15 (×8): qty 3

## 2017-01-15 NOTE — Progress Notes (Signed)
PROGRESS NOTE    [name] [name]  HGD:[id number] DOB: Mar 15, 1940 DOA: 12/26/2016 PCP: Ranae Palms, MD      Brief Narrative:  [name] [name] is a 77 y.o. W M with a hx of significant ETOH abuse; per the family ~ 4 drinks of [name] per day and occasionally several bottles a day, who presented to [facility] with weakness, exertional dyspnea  And melanotic stools for several days. Per the family, he was seen at [facility] 2 days ago; was told his Hb was low but left AMA. He was also at [facility] 2-3 weeks ago and was intubated for ETOH withdrawal.  At [facility], his H&H was 6.5/19.7, for which he was transfused prior to transfer. Other pertinent labs were: blood alcohol <10, Ammonia-19.0, ALT/AST-12/14, bili/alk phos-0.5/70. Received MVI and thiamine prior to transfer. [facility] aware of patient.     Assessment & Plan:  Principal Problem:   Acute respiratory failure with hypoxia (HCC) Active Problems:   GI bleed requiring more than 4 units of blood in 24 hours, ICU, or surgery   Melena   Antral ulcer, chronic   Acute blood loss anemia   Alcoholic cirrhosis of liver without ascites (HCC)   SOB (shortness of breath)   Fever   A-fib (HCC)   Acute diastolic CHF (congestive heart failure) (HCC)   Gastric ulcer: Per EGD 12/27/2016   Lytic lesion of bone on x-ray: Left humeral   Acute encephalopathy   UGI bleed    Acute respiratory failure with hypoxia and hypercapnia due to COPD and CHF  Acute COPD exacerbation On Bevesp, Spiriva, Symbicort at home.  ABG today showed pO2 50s on room air. Patient not on home O2.  Still very wheezy.  Procalcitonin consistently undetectable. -Continue IV solu-medrol BID -Continue GI ppx -Nebs shceduled and as needed -Continue Pulmicort  Acute on chronic diastolic CHF Hypertension With ambulation today, patient desaturated to 60s and 80s, took a while to get sats back. -Continue IV furosemide -K supplement -Strict I/Os, daily  weights, telemetry  -Daily monitoring renal function -Hold losartan, amlodipine  Chronic atrial fibrillation -Continue diltiazem -Hold anticoagulation given GIB  Acute GI bleed Acute blood loss anemia -Hold Xarelto -Continue iron -Continue PPI increase to BID for now  Alcohol dependence Alcohol withdrawal delirium Family state his alcohol use is more like 1-2 drinks per day.  Resolved withdrawal. -Continue thiamine  Hypokalemia Resolved  Lytic lesions of the humerus, fibula, skull -Obtain SPEP UPEP -Outpatient HemoOnc consult  Other medications -Continue Flomax          DVT prophylaxis: SCDs Code Status: DO NOT RESUSCITATE Family Communication: Wife and son at bedside Disposition Plan: Still markeldy hypoxic.  WIll continue IV lasix, IV steroids, likely 2-3 more days then to SNF     Consultants:   GI  Procedures:  Chest x-ray 12/27/2016, 12/28/2016, 12/30/2016, 12/31/2016, 01/05/2017  2D echo 12/31/2016  Upper endoscopy 12/27/2016 per Dr. Carlean Purl, nonbleeding gastric ulcer noted  Transfused 1 unit packed red blood cells 12/29/2016  Right lower extremity Dopplers 01/03/2017--- negative for DVT, negative for Baker's cyst  Skeletal survey 01/03/2017  CT head 01/05/2017  Antimicrobials:   IV Zosyn 12/27/2016>>>> 12/28/2016  IV Zosyn 12/31/2016>>>>>> 01/04/2017  IV vancomycin 12/27/2016>>>>> 12/28/2016  zithromax 12/28=>    Subjective: Feels better.  More alert today.  Lots of family around.  Wheezy, SOb with exertion.  Coughs occasionally, no chest pain, confusion, fever, malaise.     Objective: Vitals:   01/15/17 0921 01/15/17 1255  01/15/17 1502 01/15/17 1952  BP: 132/60  (!) 144/81   Pulse:   80   Resp:   16   Temp:   (!) 97.4 F (36.3 C)   TempSrc:   Oral   SpO2:  98% 94% 93%  Weight:      Height:        Intake/Output Summary (Last 24 hours) at 01/15/2017 2112 Last data filed at 01/15/2017 1501 Gross per 24 hour  Intake 1020  ml  Output 200 ml  Net 820 ml   Filed Weights   01/13/17 0500 01/14/17 0454 01/15/17 0534  Weight: 77.2 kg (170 lb 3.1 oz) 75.6 kg (166 lb 10.7 oz) 75.5 kg (166 lb 7.2 oz)    Examination: General appearance: Obese adult male, alert and in no acute distress.   HEENT: Anicteric, conjunctiva pink, lids and lashes normal. No nasal deformity, discharge, epistaxis.  Lips moist.   Skin: Warm and dry.  No jaundice.  No suspicious rashes or lesions. Cardiac: RRR, nl S1-S2, no murmurs appreciated.  Capillary refill is brisk.  JVP not visible.  No LE edema. Respiratory: Normal respiratory rate and rhythm.  Very wheezy, coarse throughout. Abdomen: Abdomen soft.  No TTP. No ascites, distension, hepatosplenomegaly.   MSK: No deformities or effusions. Neuro: Awake and alert.  EOMI, moves all extremities. Speech fluent.    Psych: Sensorium intact and responding to questions, attention normal. Affect normal.  Judgment and insight appear normal.    Data Reviewed: I have personally reviewed following labs and imaging studies:  CBC: Recent Labs  Lab 01/09/17 0329 01/11/17 1032 01/12/17 0354 01/14/17 0405 01/15/17 0301  WBC 13.4* 9.6 9.6 13.6* 15.4*  HGB 8.3* 8.7* 9.1* 8.7* 9.4*  HCT 26.7* 28.6* 29.8* 27.9* 30.9*  MCV 92.1 91.4 90.9 88.3 89.6  PLT 215 258 261 274 413   Basic Metabolic Panel: Recent Labs  Lab 01/09/17 0329 01/11/17 1032 01/12/17 0354 01/14/17 0405 01/15/17 0301  NA 138 133* 134* 132* 133*  K 4.2 3.6 3.7 3.3* 4.2  CL 97* 94* 94* 91* 92*  CO2 33* 32 33* 33* 33*  GLUCOSE 198* 103* 108* 118* 211*  BUN 23* 21* 20 31* 28*  CREATININE 1.05 1.10 1.08 1.07 1.00  CALCIUM 8.7* 8.8* 8.8* 8.6* 8.7*   GFR: Estimated Creatinine Clearance: 58.8 mL/min (by C-G formula based on SCr of 1 mg/dL). Liver Function Tests: Recent Labs  Lab 01/09/17 0329 01/11/17 1032 01/12/17 0354  AST 12* 25 16  ALT 13* 27 24  ALKPHOS 52 60 58  BILITOT 0.4 0.5 0.5  PROT 5.8* 5.7* 5.6*  ALBUMIN  2.1* 2.4* 2.3*   No results for input(s): LIPASE, AMYLASE in the last 168 hours. Recent Labs  Lab 01/11/17 1032  AMMONIA 13   Coagulation Profile: No results for input(s): INR, PROTIME in the last 168 hours. Cardiac Enzymes: No results for input(s): CKTOTAL, CKMB, CKMBINDEX, TROPONINI in the last 168 hours. BNP (last 3 results) No results for input(s): PROBNP in the last 8760 hours. HbA1C: No results for input(s): HGBA1C in the last 72 hours. CBG: Recent Labs  Lab 01/11/17 1147 01/12/17 0407  GLUCAP 90 106*   Lipid Profile: No results for input(s): CHOL, HDL, LDLCALC, TRIG, CHOLHDL, LDLDIRECT in the last 72 hours. Thyroid Function Tests: No results for input(s): TSH, T4TOTAL, FREET4, T3FREE, THYROIDAB in the last 72 hours. Anemia Panel: No results for input(s): VITAMINB12, FOLATE, FERRITIN, TIBC, IRON, RETICCTPCT in the last 72 hours. Urine analysis:    Component  Value Date/Time   COLORURINE YELLOW 12/31/2016 [place] 12/31/2016 0934   LABSPEC 1.020 12/31/2016 0934   PHURINE 5.5 12/31/2016 0934   GLUCOSEU NEGATIVE 12/31/2016 0934   HGBUR MODERATE (A) 12/31/2016 0934   BILIRUBINUR NEGATIVE 12/31/2016 0934   KETONESUR 15 (A) 12/31/2016 0934   PROTEINUR NEGATIVE 12/31/2016 0934   NITRITE NEGATIVE 12/31/2016 0934   LEUKOCYTESUR NEGATIVE 12/31/2016 0934   Sepsis Labs: _0 (procalcitonin:4,lacticacidven:4)  )No results found for this or any previous visit (from the past 240 hour(s)).       Radiology Studies: No results found.      Scheduled Meds: . budesonide (PULMICORT) nebulizer solution  0.5 mg Nebulization BID  . dextromethorphan-guaiFENesin  2 tablet Oral BID  . diltiazem  120 mg Oral q morning - 10a  . feeding supplement (ENSURE ENLIVE)  237 mL Oral Q24H  . feeding supplement (PRO-STAT SUGAR FREE 64)  30 mL Oral BID  . ferrous sulfate  325 mg Oral Daily  . fluticasone  2 spray Each Nare Daily  . furosemide  40 mg Intravenous  BID  . ipratropium-albuterol  3 mL Nebulization TID  . loratadine  10 mg Oral Daily  . mouth rinse  15 mL Mouth Rinse BID  . methylPREDNISolone (SOLU-MEDROL) injection  40 mg Intravenous Q12H  . multivitamin with minerals  1 tablet Oral Daily  . nicotine  21 mg Transdermal Daily  . pantoprazole  40 mg Oral BID  . potassium chloride  40 mEq Oral Daily  . tamsulosin  0.4 mg Oral QPC supper  . thiamine  100 mg Oral Daily  . traZODone  50 mg Oral QHS   Continuous Infusions: . sodium chloride Stopped (12/29/16 1100)     LOS: 20 days    Time spent: 25 minutes    Edwin Dada, MD [facility] 01/15/2017, 9:12 PM     Pager [phone] --- please page though AMION:  [email or web address] Password [email or web address] If 7PM-7AM, please contact night-coverage

## 2017-01-15 NOTE — Plan of Care (Signed)
  Education: Knowledge of General Education information will improve 01/15/2017 0326 - Progressing by Olena Materobinson, Revin Corker G, RN Note POC reviewed with pt.

## 2017-01-16 LAB — CBC
HCT: 29.1 % — ABNORMAL LOW (ref 39.0–52.0)
Hemoglobin: 9.1 g/dL — ABNORMAL LOW (ref 13.0–17.0)
MCH: 27.4 pg (ref 26.0–34.0)
MCHC: 31.3 g/dL (ref 30.0–36.0)
MCV: 87.7 fL (ref 78.0–100.0)
PLATELETS: 261 10*3/uL (ref 150–400)
RBC: 3.32 MIL/uL — ABNORMAL LOW (ref 4.22–5.81)
RDW: 16.4 % — AB (ref 11.5–15.5)
WBC: 19.3 10*3/uL — AB (ref 4.0–10.5)

## 2017-01-16 LAB — BASIC METABOLIC PANEL WITH GFR
Anion gap: 6 (ref 5–15)
BUN: 30 mg/dL — ABNORMAL HIGH (ref 6–20)
CO2: 32 mmol/L (ref 22–32)
Calcium: 8.7 mg/dL — ABNORMAL LOW (ref 8.9–10.3)
Chloride: 87 mmol/L — ABNORMAL LOW (ref 101–111)
Creatinine, Ser: 1.31 mg/dL — ABNORMAL HIGH (ref 0.61–1.24)
GFR calc Af Amer: 59 mL/min — ABNORMAL LOW
GFR calc non Af Amer: 51 mL/min — ABNORMAL LOW
Glucose, Bld: 198 mg/dL — ABNORMAL HIGH (ref 65–99)
Potassium: 4.2 mmol/L (ref 3.5–5.1)
Sodium: 125 mmol/L — ABNORMAL LOW (ref 135–145)

## 2017-01-16 NOTE — Progress Notes (Addendum)
CSW consulted by Dr. Maryfrances Bunnellanford concerning pt's discharge Monday morning. CSW spoke with pt's wife.  Pt's wife is aware of pt's discharging tomorrow morning.  [facility] [place].   CSW contacted  [facility] to make sure bed was available for pt discharge on Monday. Bed is available for pt discharge.  Budd Palmerara Rey Dansby LCSWA [phone]

## 2017-01-16 NOTE — Progress Notes (Signed)
PROGRESS NOTE    [name] [name]  XQJ:[id number] DOB: [birth year] DOA: 12/26/2016 PCP: Ranae Palms, MD      Brief Narrative:  [name] [name] is a 77 y.o. W M with a hx of significant ETOH abuse; per the family ~ 4 drinks of [name] per day and occasionally several bottles a day, who presented to [facility] with weakness, exertional dyspnea  And melanotic stools for several days. Per the family, he was seen at [facility] 2 days ago; was told his Hb was low but left AMA. He was also at [facility] 2-3 weeks ago and was intubated for ETOH withdrawal.  At [facility], his H&H was 6.5/19.7, for which he was transfused prior to transfer. Other pertinent labs were: blood alcohol <10, Ammonia-19.0, ALT/AST-12/14, bili/alk phos-0.5/70. Received MVI and thiamine prior to transfer. [facility] aware of patient.     Assessment & Plan:  Principal Problem:   Acute respiratory failure with hypoxia (HCC) Active Problems:   GI bleed requiring more than 4 units of blood in 24 hours, ICU, or surgery   Melena   Antral ulcer, chronic   Acute blood loss anemia   Alcoholic cirrhosis of liver without ascites (HCC)   SOB (shortness of breath)   Fever   A-fib (HCC)   Acute diastolic CHF (congestive heart failure) (HCC)   Gastric ulcer: Per EGD 12/27/2016   Lytic lesion of bone on x-ray: Left humeral   Acute encephalopathy   UGI bleed    Acute respiratory failure with hypoxia and hypercapnia due to COPD and CHF  Acute COPD exacerbation On Bevesp, Spiriva, Symbicort at home.  Patient not on home O2.  Procalcitonin consistently undetectable. -Continue IV solu-medrol BID one more day -Continue GI ppx -Nebs shceduled and as needed   Acute on chronic diastolic CHF Hypertension Cr up today, appeared a little dehydrated this mroning. -Stop furosemide, K -Strict I/Os, daily weights, telemetry  -Daily monitoring renal function -Hold losartan, amlodipine  Chronic atrial fibrillation -Continue  diltiazem -Hold anticoagulation given GIB  Acute GI bleed Acute blood loss anemia -Hold Xarelto -Continue iron -Continue PPI BID  Alcohol dependence Alcohol withdrawal delirium Family state his alcohol use is more like 1-2 drinks per day.  Resolved withdrawal. -Continue thiamine  Hypokalemia Resolved  Lytic lesions of the humerus, fibula, skull -Follow SPEP UPEP -Outpatient HemoOnc consult  Other medications -Continue Flomax          DVT prophylaxis: SCDs Code Status: DO NOT RESUSCITATE Family Communication: Wife and son at bedside Disposition Plan: Off O2 at rest.  Will ambulate with O2 tomorrow and if needed, discharge with O2.  Likely to SNF tomorwo to finishe steroids.     Consultants:   GI  Procedures:  Chest x-ray 12/27/2016, 12/28/2016, 12/30/2016, 12/31/2016, 01/05/2017  2D echo 12/31/2016  Upper endoscopy 12/27/2016 per Dr. Carlean Purl, nonbleeding gastric ulcer noted  Transfused 1 unit packed red blood cells 12/29/2016  Right lower extremity Dopplers 01/03/2017--- negative for DVT, negative for Baker's cyst  Skeletal survey 01/03/2017  CT head 01/05/2017  Antimicrobials:   IV Zosyn 12/27/2016>>>> 12/28/2016  IV Zosyn 12/31/2016>>>>>> 01/04/2017  IV vancomycin 12/27/2016>>>>> 12/28/2016  zithromax 12/28=>    Subjective: Feels better again.  Still SOB with exertion, but lungs clearer.  Coughs occasionally, a little sputum, improving.  No chest pain, confusion, fever, malaise.     Objective: Vitals:   01/16/17 1347 01/16/17 1407 01/16/17 1455 01/16/17 1456  BP: (!) 127/55 129/62    Pulse: 85 83 89  Resp: 20 17 18    Temp: 98.1 F (36.7 C) 97.8 F (36.6 C)    TempSrc: Oral Oral    SpO2: 91% 94% 96% 96%  Weight:      Height:        Intake/Output Summary (Last 24 hours) at 01/16/2017 1518 Last data filed at 01/16/2017 1410 Gross per 24 hour  Intake 200 ml  Output 2050 ml  Net -1850 ml   Filed Weights   01/14/17 0454  01/15/17 0534 01/16/17 0622  Weight: 75.6 kg (166 lb 10.7 oz) 75.5 kg (166 lb 7.2 oz) 78.5 kg (173 lb 1 oz)    Examination: General appearance: Obese adult male, alert and in no acute distress.   HEENT: Anicteric, conjunctiva pink, lids and lashes normal. No nasal deformity, discharge, epistaxis.  Lips moist.   Skin: Warm and dry.  No jaundice.  No suspicious rashes or lesions. Cardiac: RRR, nl S1-S2, no murmurs appreciated.  Capillary refill is brisk.  JVP not visible.  No LE edema. Respiratory: Normal respiratory rate and rhythm.  Rare wheezing, good air movement. Abdomen: Abdomen soft.  No TTP. No ascites, distension, hepatosplenomegaly.   MSK: No deformities or effusions. Neuro: Awake and alert.  EOMI, moves all extremities. Speech fluent.    Psych: Sensorium intact and responding to questions, attention normal. Affect normal.  Judgment and insight appear normal.    Data Reviewed: I have personally reviewed following labs and imaging studies:  CBC: Recent Labs  Lab 01/11/17 1032 01/12/17 0354 01/14/17 0405 01/15/17 0301 01/16/17 0251  WBC 9.6 9.6 13.6* 15.4* 19.3*  HGB 8.7* 9.1* 8.7* 9.4* 9.1*  HCT 28.6* 29.8* 27.9* 30.9* 29.1*  MCV 91.4 90.9 88.3 89.6 87.7  PLT 258 261 274 266 655   Basic Metabolic Panel: Recent Labs  Lab 01/11/17 1032 01/12/17 0354 01/14/17 0405 01/15/17 0301 01/16/17 0251  NA 133* 134* 132* 133* 125*  K 3.6 3.7 3.3* 4.2 4.2  CL 94* 94* 91* 92* 87*  CO2 32 33* 33* 33* 32  GLUCOSE 103* 108* 118* 211* 198*  BUN 21* 20 31* 28* 30*  CREATININE 1.10 1.08 1.07 1.00 1.31*  CALCIUM 8.8* 8.8* 8.6* 8.7* 8.7*   GFR: Estimated Creatinine Clearance: 44.9 mL/min (A) (by C-G formula based on SCr of 1.31 mg/dL (H)). Liver Function Tests: Recent Labs  Lab 01/11/17 1032 01/12/17 0354  AST 25 16  ALT 27 24  ALKPHOS 60 58  BILITOT 0.5 0.5  PROT 5.7* 5.6*  ALBUMIN 2.4* 2.3*   No results for input(s): LIPASE, AMYLASE in the last 168 hours. Recent Labs   Lab 01/11/17 1032  AMMONIA 13   Coagulation Profile: No results for input(s): INR, PROTIME in the last 168 hours. Cardiac Enzymes: No results for input(s): CKTOTAL, CKMB, CKMBINDEX, TROPONINI in the last 168 hours. BNP (last 3 results) No results for input(s): PROBNP in the last 8760 hours. HbA1C: No results for input(s): HGBA1C in the last 72 hours. CBG: Recent Labs  Lab 01/11/17 1147 01/12/17 0407  GLUCAP 90 106*   Lipid Profile: No results for input(s): CHOL, HDL, LDLCALC, TRIG, CHOLHDL, LDLDIRECT in the last 72 hours. Thyroid Function Tests: No results for input(s): TSH, T4TOTAL, FREET4, T3FREE, THYROIDAB in the last 72 hours. Anemia Panel: No results for input(s): VITAMINB12, FOLATE, FERRITIN, TIBC, IRON, RETICCTPCT in the last 72 hours. Urine analysis:    Component Value Date/Time   COLORURINE YELLOW 12/31/2016 [place] 12/31/2016 0934   LABSPEC 1.020 12/31/2016 0934  PHURINE 5.5 12/31/2016 0934   GLUCOSEU NEGATIVE 12/31/2016 0934   HGBUR MODERATE (A) 12/31/2016 0934   BILIRUBINUR NEGATIVE 12/31/2016 0934   KETONESUR 15 (A) 12/31/2016 0934   PROTEINUR NEGATIVE 12/31/2016 0934   NITRITE NEGATIVE 12/31/2016 0934   LEUKOCYTESUR NEGATIVE 12/31/2016 0934   Sepsis Labs: @LABRCNTIP (procalcitonin:4,lacticacidven:4)  )No results found for this or any previous visit (from the past 240 hour(s)).       Radiology Studies: No results found.      Scheduled Meds: . budesonide (PULMICORT) nebulizer solution  0.5 mg Nebulization BID  . dextromethorphan-guaiFENesin  2 tablet Oral BID  . diltiazem  120 mg Oral q morning - 10a  . feeding supplement (ENSURE ENLIVE)  237 mL Oral Q24H  . feeding supplement (PRO-STAT SUGAR FREE 64)  30 mL Oral BID  . ferrous sulfate  325 mg Oral Daily  . fluticasone  2 spray Each Nare Daily  . ipratropium-albuterol  3 mL Nebulization TID  . loratadine  10 mg Oral Daily  . mouth rinse  15 mL Mouth Rinse BID  .  methylPREDNISolone (SOLU-MEDROL) injection  40 mg Intravenous Q12H  . multivitamin with minerals  1 tablet Oral Daily  . nicotine  21 mg Transdermal Daily  . pantoprazole  40 mg Oral BID  . potassium chloride  40 mEq Oral Daily  . tamsulosin  0.4 mg Oral QPC supper  . thiamine  100 mg Oral Daily  . traZODone  50 mg Oral QHS   Continuous Infusions: . sodium chloride Stopped (12/29/16 1100)     LOS: 21 days    Time spent: 25 minutes    Edwin Dada, MD [facility] 01/16/2017, 3:18 PM     Pager [phone] --- please page though AMION:  [email or web address] Password [email or web address] If 7PM-7AM, please contact night-coverage

## 2017-01-16 NOTE — Progress Notes (Signed)
MD returned call and said he will come talk to the daughter in 4930 to 45 minutes. Daughter was notified. And MD asked if the patient was on oxygen and I stated no he has still been off of oxygen since yesterday which is the doctors's goal.

## 2017-01-16 NOTE — Progress Notes (Signed)
Patient's daughter is requesting to speak with the doctor since she was not here earlier. MD was paged and notified.

## 2017-01-16 NOTE — Progress Notes (Signed)
Patient had a 5 beat run of V-tach and was asymptomatic. Patient watching football with no concerns. Will continue to monitor.

## 2017-01-17 ENCOUNTER — Ambulatory Visit: Payer: Medicare Other | Admitting: Nurse Practitioner

## 2017-01-17 LAB — BASIC METABOLIC PANEL
ANION GAP: 8 (ref 5–15)
BUN: 24 mg/dL — AB (ref 6–20)
CALCIUM: 8.8 mg/dL — AB (ref 8.9–10.3)
CO2: 30 mmol/L (ref 22–32)
CREATININE: 1.07 mg/dL (ref 0.61–1.24)
Chloride: 89 mmol/L — ABNORMAL LOW (ref 101–111)
GFR calc Af Amer: >60 mL/min (ref >60)
GLUCOSE: 230 mg/dL — AB (ref 65–99)
Potassium: 4.7 mmol/L (ref 3.5–5.1)
Sodium: 127 mmol/L — ABNORMAL LOW (ref 135–145)

## 2017-01-17 LAB — BLOOD GAS, ARTERIAL
Acid-Base Excess: 8.4 mmol/L — ABNORMAL HIGH (ref 0.0–2.0)
BICARBONATE: 32.9 mmol/L — AB (ref 20.0–28.0)
Drawn by: 441371
O2 CONTENT: 8 L/min
O2 SAT: 92.6 %
PATIENT TEMPERATURE: 98.4
PO2 ART: 64.9 mmHg — AB (ref 83.0–108.0)
pCO2 arterial: 50 mmHg — ABNORMAL HIGH (ref 32.0–48.0)
pH, Arterial: 7.433 (ref 7.350–7.450)

## 2017-01-17 LAB — CBC
HCT: 29.9 % — ABNORMAL LOW (ref 39.0–52.0)
Hemoglobin: 9.3 g/dL — ABNORMAL LOW (ref 13.0–17.0)
MCH: 27.3 pg (ref 26.0–34.0)
MCHC: 31.1 g/dL (ref 30.0–36.0)
MCV: 87.7 fL (ref 78.0–100.0)
PLATELETS: 261 10*3/uL (ref 150–400)
RBC: 3.41 MIL/uL — ABNORMAL LOW (ref 4.22–5.81)
RDW: 16.6 % — AB (ref 11.5–15.5)
WBC: 16.5 10*3/uL — ABNORMAL HIGH (ref 4.0–10.5)

## 2017-01-17 LAB — PROTEIN ELECTROPHORESIS, SERUM
A/G RATIO SPE: 0.8 (ref 0.7–1.7)
ALPHA-2-GLOBULIN: 0.9 g/dL (ref 0.4–1.0)
Albumin ELP: 2.3 g/dL — ABNORMAL LOW (ref 2.9–4.4)
Alpha-1-Globulin: 0.3 g/dL (ref 0.0–0.4)
BETA GLOBULIN: 0.8 g/dL (ref 0.7–1.3)
Gamma Globulin: 1.1 g/dL (ref 0.4–1.8)
Globulin, Total: 3 g/dL (ref 2.2–3.9)
M-SPIKE, %: 0.3 g/dL — AB
Total Protein ELP: 5.3 g/dL — ABNORMAL LOW (ref 6.0–8.5)

## 2017-01-17 MED ORDER — PANTOPRAZOLE SODIUM 40 MG PO TBEC: 40.0000 mg | DELAYED_RELEASE_TABLET | Freq: Two times a day (BID) | ORAL | 0 refills | Status: DC

## 2017-01-17 MED ORDER — PRO-STAT SUGAR FREE PO LIQD: 30.0000 mL | Freq: Two times a day (BID) | ORAL | 0 refills | Status: DC

## 2017-01-17 MED ORDER — OXYCODONE-ACETAMINOPHEN 5-325 MG PO TABS: 1.0000 | ORAL_TABLET | Freq: Every day | ORAL | 0 refills | Status: AC | PRN

## 2017-01-17 MED ORDER — PREDNISONE 20 MG PO TABS: ORAL_TABLET | ORAL | 0 refills | Status: AC

## 2017-01-17 MED ORDER — ENSURE ENLIVE PO LIQD: 237.0000 mL | ORAL | 12 refills | Status: DC

## 2017-01-17 MED ORDER — MELATONIN 3 MG PO TABS: 3.0000 mg | ORAL_TABLET | Freq: Every evening | ORAL | 0 refills | Status: AC | PRN

## 2017-01-17 MED ORDER — DM-GUAIFENESIN ER 30-600 MG PO TB12: 2.0000 | ORAL_TABLET | Freq: Two times a day (BID) | ORAL | 0 refills | Status: AC

## 2017-01-17 MED ORDER — NICOTINE 21 MG/24HR TD PT24: 21.0000 mg | MEDICATED_PATCH | Freq: Every day | TRANSDERMAL | 0 refills | Status: DC

## 2017-01-17 MED ORDER — FUROSEMIDE 40 MG PO TABS: 40.0000 mg | ORAL_TABLET | Freq: Every day | ORAL | 0 refills | Status: AC | PRN

## 2017-01-17 MED ORDER — ACETAMINOPHEN 325 MG PO TABS: 650.0000 mg | ORAL_TABLET | Freq: Four times a day (QID) | ORAL | 0 refills | Status: DC | PRN

## 2017-01-17 MED ORDER — FERROUS SULFATE 325 (65 FE) MG PO TABS: 325.0000 mg | ORAL_TABLET | Freq: Every day | ORAL | 0 refills | Status: AC

## 2017-01-17 NOTE — Clinical Social Work Placement (Signed)
   CLINICAL SOCIAL WORK PLACEMENT  NOTE  Date:  01/17/2017  Patient Details  Name: [name] [name] MRN: [id number] Date of Birth: Oct 18, 1940  Clinical Social Work is seeking post-discharge placement for this patient at the Skilled  Nursing Facility level of care (*CSW will initial, date and re-position this form in  chart as items are completed):  Yes   Patient/family provided with [facility] Clinical Social Work Department's list of facilities offering this level of care within the geographic area requested by the patient (or if unable, by the patient's family).  Yes   Patient/family informed of their freedom to choose among providers that offer the needed level of care, that participate in Medicare, Medicaid or managed care program needed by the patient, have an available bed and are willing to accept the patient.  Yes   Patient/family informed of [facility]'s ownership interest in [facility] and [facility], as well as of the fact that they are under no obligation to receive care at these facilities.  PASRR submitted to EDS on       PASRR number received on       Existing PASRR number confirmed on       FL2 transmitted to all facilities in geographic area requested by pt/family on 01/17/17     FL2 transmitted to all facilities within larger geographic area on       Patient informed that his/her managed care company has contracts with or will negotiate with certain facilities, including the following:        Yes   Patient/family informed of bed offers received.  [facility]     Physician recommends and patient chooses bed at      [facility] 01/17[facility].  Patient to be transferred to facility by [facility]     Patient family notified on 01/17/17 of transfer.  Name of family member notified:  Spouse, [name] [name]     PHYSICIAN Please prepare priority discharge summary, including  medications     Additional Comment:    _______________________________________________ Mearl LatinNadia S Melquisedec Journey, LCSWA 01/17/2017, 10:58 AM

## 2017-01-17 NOTE — Progress Notes (Signed)
Occupational Therapy Treatment Patient Details Name: [name] GenerousJerry F [name] MRN: [id number] DOB: 12-17-1940 Today's Date: 01/17/2017    History of present illness Pt is a 77 y.o. who [facility] with weakness, exertional dyspnea, and melanotic stools and was subsequently [facility]. Pt with multiple hospitalizations in the month prior to presentation. He has a history of ETOH abuse. Pt found to have gastric ulcer and hospital course complicated by fever of unknown origin, atrial fibrillation with RVR, and acute on chronic diastolic heart failure. Pt also with lytic lesions in L humeruse, L fibula, and skull. Pt transferred to ICU on 12/28 with respiratory failure and required bipap.   OT comments  Pt eager to go to rehab and get stronger so he may return home. Performed bathing, seated grooming and UB dressing with min assist for UB and total for LB. Pt with urinary incontinence during session, so deferred donning underwear and pants as he is to discharge later today and only has one set of clothing. Pt transferred with one person assist. Progressing well.  Follow Up Recommendations  SNF;Supervision/Assistance - 24 hour    Equipment Recommendations       Recommendations for Other Services      Precautions / Restrictions Precautions Precautions: Fall Precaution Comments: watch 02       Mobility Bed Mobility Overal bed mobility: Needs Assistance Bed Mobility: Sit to Supine   Sidelying to sit: Min guard;HOB elevated       General bed mobility comments: increased time  Transfers Overall transfer level: Needs assistance Equipment used: Rolling walker (2 wheeled) Transfers: Sit to/from [employer]Stand;Stand Pivot Transfers Sit to Stand: Min assist;From elevated surface Stand pivot transfers: Min assist       General transfer comment: assist to rise, steady and control descent, cues for hand placement    Balance Overall balance assessment: Needs assistance    Sitting balance-Leahy Scale: Good     Standing balance support: Bilateral upper extremity supported Standing balance-Leahy Scale: Poor Standing balance comment: unable to release walker in static standing                           ADL either performed or assessed with clinical judgement   ADL Overall ADL's : Needs assistance/impaired     Grooming: Wash/dry hands;Wash/dry face;Brushing hair;Sitting;Set up   Upper Body Bathing: Minimal assistance;Sitting   Lower Body Bathing: Total assistance   Upper Body Dressing : Set up;Sitting       Toilet Transfer: Minimal assistance;Stand-pivot;RW Toilet Transfer Details (indicate cue type and reason): simulated to chair Toileting- Clothing Manipulation and Hygiene: Moderate assistance;Sit to/from stand;Sitting/lateral lean Toileting - Clothing Manipulation Details (indicate cue type and reason): pt performed front pericare, OT did posterior             Vision       Perception     Praxis      Cognition Arousal/Alertness: Awake/alert Behavior During Therapy: WFL for tasks assessed/performed Overall Cognitive Status: Impaired/Different from baseline Area of Impairment: Memory;Orientation                 Orientation Level: Disoriented to;Place;Time Current Attention Level: Selective Memory: Decreased short-term memory                  Exercises     Shoulder Instructions       General Comments      Pertinent Vitals/ Pain  Pain Assessment: No/denies pain  Home Living                                          Prior Functioning/Environment              Frequency  Min 2X/week        Progress Toward Goals  OT Goals(current goals can now be found in the care plan section)  Progress towards OT goals: Progressing toward goals  Acute Rehab OT Goals Patient Stated Goal: to get stronger OT Goal Formulation: With patient Time For Goal Achievement:  01/19/17 Potential to Achieve Goals: Fair  Plan Discharge plan remains appropriate    Co-evaluation                 AM-PAC PT "6 Clicks" Daily Activity     Outcome Measure   Help from another person eating meals?: None Help from another person taking care of personal grooming?: A Little Help from another person toileting, which includes using toliet, bedpan, or urinal?: A Lot Help from another person bathing (including washing, rinsing, drying)?: A Lot Help from another person to put on and taking off regular upper body clothing?: A Little Help from another person to put on and taking off regular lower body clothing?: Total 6 Click Score: 15    End of Session Equipment Utilized During Treatment: Gait belt;Rolling walker;Oxygen  OT Visit Diagnosis: Other abnormalities of gait and mobility (R26.89);Muscle weakness (generalized) (M62.81)   Activity Tolerance Patient tolerated treatment well   Patient Left in chair;with call bell/phone within reach;with chair alarm set   Nurse Communication Other (comment)(ok to remove telemetry )        Time: 1405-1450 OT Time Calculation (min): 45 min  Charges: OT General Charges $OT Visit: 1 Visit OT Treatments $Self Care/Home Management : 38-52 mins  01/17/2017 Martie Round, OTR/L Pager: [phone]   [place] Planas [place] Bailiff 01/17/2017, 2:58 PM

## 2017-01-17 NOTE — Progress Notes (Signed)
Patient with decreased LOC and decrease O2 sats earlier this am.  RT placed patient on HFNC and pt received a breathing treatment.  ABG done.   Per RT and RN patient improved significantly.   At this time patient is fully alert and finishing his breakfast.  His O2 sats are 88% on HFNC.  Rn to call if assistance needed.

## 2017-01-17 NOTE — Progress Notes (Addendum)
RT found pt on room air sat 79% with minimal responsiveness. Sat improved to 93% during neb treatment. RN notified.  RN called rapid response.  ABG ordered & collected.  Following neb treatments, pt placed on a [place] HFNC 8 L, sat 94%.

## 2017-01-17 NOTE — Progress Notes (Signed)
SATURATION QUALIFICATIONS: (This note is used to comply with regulatory documentation for home oxygen)  Patient Saturations on Room Air at Rest = 86%  Patient Saturations on Room Air while Ambulating = 79%  Patient Saturations on 4 Liters of oxygen while Ambulating = 98%  Please briefly explain why patient needs home oxygen:

## 2017-01-17 NOTE — Discharge Summary (Signed)
Physician Discharge Summary  [name] [name] OEU:[id number] DOB: 12/01/1940 DOA: 12/26/2016  PCP: Ranae Palms, MD  Admit date: 12/26/2016 Discharge date: 01/17/2017  Admitted From: Home  Disposition:  SNF [place]   Recommendations for Outpatient Follow-up:  1. Nursing home: Please schedule follow up with [facility] in [place] within next 2-3 weeks 2. Please obtain BMP/CBC in 4 days 3. Dr. Hardie Shackleton: Please follow up on the following pending results: SPEP from 01/13/2017 4. Nursing home: Please schedule patient with Hematology-Oncology to follow up lytic bone lesions and SPEP  5. Please resume Xarelto when prednisone dose <40 daily 6. Nursing home: Monitor weight daily for 5 days, restart furosemide if weight gain >5lbs in 2 days 7. Nursing home MD: Please re-evaluate patient for decision-making capacity when necessary  Home Health: N/A  Equipment/Devices: Oxygen  Discharge Condition: Fair  CODE STATUS: DO NOT RESUSCITATE Diet recommendation: Cardiac, low sodium  Brief/Interim Summary: [name] [name] is a 77 y.o. 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 Per the family, he was seen at [facility] 2 days prior; was told his Hb was low but left AMA. He was also at [facility] 2-3 weeks prior to that and was intubated for ETOH withdrawal.   At [facility], his H&H was 6.5/19.7, for which he was transfused 2U PRBCs prior to transfer.       Acute blood loss anemia UGIB Patient underwent EGD that showed ulcer, non-bleeding, no high risk features.  His Xarelto was stopped.  He received 3U PRBCs total.  He was started on PPI. -Follow up with [facility]  Acute respiratory failure with hypoxia COPD exacerbation Acute on chronic diastolic CHF OSA not on CPAP After his EGD and GIB was stabilized, the patient  developed hypoxic respiratory failure. He has advanced COPD, not on home O2, OSA not on CPAP, and diastolic CHF.  He was diuresed with IV lasix to 169 lbs.  He was treated with IV steroids and scheduled bronchodilators and antibiotics for COPD exacerbation.  At discharge, he was prescribed prednisone taper for 12 days.  Persistent encephalopathy Activation of POA The patient was persistently encephalopathic during his hospitalization.  This was thought to be due to underlying dementia, previously undiagnosed, in the context of his prolonged illness.  His ability to articulate treatment decisions was inadequate, he was forgetful of basic treatment options presented, occasionally offered bizarre treatment requests (assuming that his doctors and nurses from the hospital would come to SNF with him), and frequently changed his mind.  His wife was activated as POA at the time of discharge.   -Please re-evaluate for decision-making capacity PRN at SNF  Chronic atrial fibrillation Resume Xarelto when prednisone dose reduced as below  Smoking Smoking cessation was recommended.   -Continue nicotine patch  Possible alcohol dependence Treated with thiamine.  Monitored with CIWA protocol.  Had mild encephalopathy, thought not to be alcohol delirium.  Family report his alcohol intake is more moderate than initially reported, and that he does not have withdrawals.  Lytic lesions of the humerus, fibula, skull Incidental lytic bone lesions of humerus were noted on CXR.  This was followed up with skeletal survey that showed other lesions of the fibula, skull.  Family are aware. -SPEP pending at time of discharge -Needs PSA, referral to Hematology Oncology pending lab results  Discharge Diagnoses:  Principal Problem:   Acute respiratory failure with hypoxia (HCC) Active Problems:   GI bleed requiring more than 4 units of blood in 24 hours, ICU, or surgery   Melena   Antral ulcer, chronic   Acute  blood loss anemia   Alcoholic cirrhosis of liver without ascites (HCC)   SOB (shortness of breath)   Fever   A-fib (HCC)   Acute diastolic CHF (congestive heart failure) (HCC)   Gastric ulcer: Per EGD 12/27/2016   Lytic lesion of bone on x-ray: Left humeral   Acute encephalopathy   UGI bleed    Discharge Instructions  Discharge Instructions    Diet - low sodium heart healthy   Complete by:  As directed    Discharge instructions   Complete by:  As directed    From Dr. Loleta Books: You were admitted for anemia from a bleeding ulcer in your stomach.  You were started on medicines to protect the stomach, and are continuing one of those (Protonix) at discharge.  You had an endscopy (camera into the stomach) while you were here, that showed an ulcer, no longer bleeding.  You were transfused 3 units of blood while you were here.    Continue Protonix 40 mg twice daily until you see your gastroenterology doctor again. Call [facility] for a follow up appointment within the next 2-3 weeks Resume Xarelto soon (see below)  If you see back and tarry stools, stop Xarelto immediately and call your doctor.   While you were in the hospital, you developed a congestive heart failure episode, which is now resolved, and COPD, which is now resolving.  For your COPD Use oxygen for now, 2L all the time Resume your home Symbicort and Spiriva Take prednisone taper as below: 60 mg for two days 40 mg for two days 20 mg for two days 10 mg for two days, then stop  For your heart:  Continue your diltiazem Measure weight daily for the next 5 days, if >5 lbs weight gain in 2 days, start Lasix 40 mg PO daily for three days Restart your Xarelto for Afib when your prednisone daily dose is less than 40 For now, stop losartan and amlodipine.  Restart if necessary.   Increase activity slowly   Complete by:  As directed      Allergies as of 01/17/2017   No Known Allergies     Medication List     STOP taking these medications   ibuprofen 800 MG tablet Commonly known as:  ADVIL,MOTRIN   losartan 100 MG tablet Commonly known as:  COZAAR   XARELTO 20 MG Tabs tablet Generic drug:  rivaroxaban     TAKE these medications   acetaminophen 325 MG tablet Commonly known as:  TYLENOL Take 2 tablets (650 mg total) by mouth every 6 (six) hours as needed for mild pain or fever.   albuterol (2.5 MG/3ML) 0.083% nebulizer solution Commonly known as:  PROVENTIL Take 2.5 mg by nebulization every 6 (six) hours as needed for wheezing or shortness of breath.   albuterol 108 (90 Base) MCG/ACT inhaler Commonly known as:  PROVENTIL HFA;VENTOLIN HFA Inhale 1-2 puffs into the lungs every 4 (four) hours as needed for wheezing or shortness of breath.   CARTIA XT 120 MG 24 hr capsule Generic drug:  diltiazem TAKE 1 CAPSULE BY MOUTH EVERY MORNING   cetirizine 10 MG tablet Commonly known as:  ZYRTEC Take 10 mg by mouth daily.   dextromethorphan-guaiFENesin 30-600 MG 12hr  tablet Commonly known as:  MUCINEX DM Take 2 tablets by mouth 2 (two) times daily.   feeding supplement (ENSURE ENLIVE) Liqd Take 237 mLs by mouth daily. Start taking on:  01/18/2017   feeding supplement (PRO-STAT SUGAR FREE 64) Liqd Take 30 mLs by mouth 2 (two) times daily.   ferrous sulfate 325 (65 FE) MG tablet Take 1 tablet (325 mg total) by mouth daily. Start taking on:  01/18/2017   fluticasone 50 MCG/ACT nasal spray Commonly known as:  FLONASE Place 2 sprays into both nostrils daily.   furosemide 40 MG tablet Commonly known as:  LASIX Take 1 tablet (40 mg total) by mouth daily as needed for edema (Or weight gain >5 lbs in 2 days). What changed:    when to take this  reasons to take this   Melatonin 3 MG Tabs Take 1 tablet (3 mg total) by mouth at bedtime as needed (sleep).   nicotine 21 mg/24hr patch Commonly known as:  NICODERM CQ - dosed in mg/24 hours Place 1 patch (21 mg total) onto the skin  daily. Start taking on:  01/18/2017   oxyCODONE-acetaminophen 5-325 MG tablet Commonly known as:  PERCOCET/ROXICET Take 1 tablet by mouth daily as needed for moderate pain.   pantoprazole 40 MG tablet Commonly known as:  PROTONIX Take 1 tablet (40 mg total) by mouth 2 (two) times daily.   potassium chloride SA 20 MEQ tablet Commonly known as:  K-DUR,KLOR-CON Take 40 mEq by mouth daily as needed (on days taking Furosemide).   predniSONE 20 MG tablet Commonly known as:  DELTASONE Take 3 tablets (60 mg total) by mouth daily for 2 days, THEN 2 tablets (40 mg total) daily for 2 days, THEN 1 tablet (20 mg total) daily for 2 days, THEN 0.5 tablets (10 mg total) daily for 2 days. Start taking on:  01/17/2017   SPIRIVA HANDIHALER 18 MCG inhalation capsule Generic drug:  tiotropium INL CONTENTS OF 1 C ONCE DAILY USING HANDIHALER   SYMBICORT 160-4.5 MCG/ACT inhaler Generic drug:  budesonide-formoterol INHALE 2 PUFFS TWICE DAILY   tamsulosin 0.4 MG Caps capsule Commonly known as:  FLOMAX TK ONE C PO ONCE TO  BID PRF URINARY FREQUENCY       Contact information for follow-up providers    Willia Craze, NP Follow up on 01/17/2017.   Specialty:  Gastroenterology Why:  2:30 PM for follow up of liver disease.   Contact information: [place] [address] [phone]            Contact information for after-discharge care    [facility] .   Service:  Skilled Nursing Contact information: [place] [phone]                 No Known Allergies  Consultations:  GI  WOC   Procedures/Studies: Ct Head Wo Contrast  Result Date: 01/11/2017 CLINICAL DATA:  Altered mental status. EXAM: CT HEAD WITHOUT CONTRAST TECHNIQUE: Contiguous axial images were obtained from the base of the skull through the vertex without intravenous contrast. COMPARISON:  01/05/2017 FINDINGS: Brain: Stable age  related cerebral atrophy, ventriculomegaly and periventricular white matter disease. No extra-axial fluid collections are identified. No CT findings for acute hemispheric infarction or intracranial hemorrhage. No mass lesions. The brainstem and cerebellum are normal. Vascular: No hyperdense vessel or unexpected calcification. Skull: No skull fracture or bone lesions. Sinuses/Orbits: The paranasal sinuses are clear. The  globes are intact. Stable left mastoid effusions. Other: No scalp lesions or hematoma. IMPRESSION: Stable age related changes. No acute intracranial findings or mass lesions. Electronically Signed   By: Marijo Sanes M.D.   On: 01/11/2017 10:52   Ct Head Wo Contrast  Result Date: 01/05/2017 CLINICAL DATA:  Altered level of consciousness. EXAM: CT HEAD WITHOUT CONTRAST TECHNIQUE: Contiguous axial images were obtained from the base of the skull through the vertex without intravenous contrast. COMPARISON:  None. FINDINGS: Brain: There is atrophy and chronic small vessel disease changes. No acute intracranial abnormality. Specifically, no hemorrhage, hydrocephalus, mass lesion, acute infarction, or significant intracranial injury. Vascular: No hyperdense vessel or unexpected calcification. Skull: No acute calvarial abnormality. Sinuses/Orbits: No acute finding. Other: None IMPRESSION: No acute intracranial abnormality. Atrophy, chronic microvascular disease. Electronically Signed   By: Rolm Baptise M.D.   On: 01/05/2017 11:35   Ct Chest Wo Contrast  Result Date: 01/11/2017 CLINICAL DATA:  77 year old male with a history of altered mental status EXAM: CT CHEST WITHOUT CONTRAST TECHNIQUE: Multidetector CT imaging of the chest was performed following the standard protocol without IV contrast. COMPARISON:  No prior chest CT, chest x-ray 01/11/2017 FINDINGS: Cardiovascular: Heart size borderline enlarged. Differential attenuation of the blood pool and the myocardium, compatible with anemia. Dense  calcifications of left main, left anterior descending, circumflex, right coronary arteries. No pericardial fluid/ thickening. Calcifications of the aortic arch. No aneurysm. No periaortic fluid. Calcifications extend into the branch vessels. Unremarkable diameter of the main pulmonary artery. Mediastinum/Nodes: Multiple borderline enlarged lymph nodes of the mediastinum, including prevascular, upper and lower paratracheal, and subcarinal. Lungs/Pleura: Endobronchial debris within the left mainstem bronchus. Circumferential bronchial wall thickening predominantly of the lower lobes. Mixed ground-glass and nodular opacities of the right upper lobe and less of the left upper lobe. Centrilobular opacities with mixed linear and nodular opacities in a peribronchovascular distribution of the lower lobes. Trace left-sided pleural effusion. Upper Abdomen: No acute abnormality. Musculoskeletal: No acute displaced fracture. Multilevel degenerative changes of the spine. IMPRESSION: Acute bronchopneumonia, with reactive mediastinal adenopathy. Trace left-sided pleural effusion. Aortic calcifications with associated left main and 3 vessel coronary artery disease. Aortic Atherosclerosis (ICD10-I70.0). Electronically Signed   By: Corrie Mckusick D.O.   On: 01/11/2017 10:54   Dg Chest Port 1 View  Result Date: 01/11/2017 CLINICAL DATA:  Hypoxia EXAM: PORTABLE CHEST 1 VIEW COMPARISON:  01/07/2017 FINDINGS: Cardiac shadow is stable. Lungs are well aerated bilaterally. Minimal left basilar atelectasis is again noted. The right lung is clear. No acute bony abnormality is noted. IMPRESSION: Stable left basilar atelectasis. Electronically Signed   By: Inez Catalina M.D.   On: 01/11/2017 08:27   Dg Chest Port 1 View  Result Date: 01/07/2017 CLINICAL DATA:  Respiratory Distress. On Bi-PAP. Hypoxia. Weakness. Unable to follow commands. EXAM: PORTABLE CHEST 1 VIEW COMPARISON:  01/05/2017 FINDINGS: Shallow lung inflation. Cardiomegaly.  There are patchy opacities at the lung bases bilaterally, similar in appearance to the previous exam. No pulmonary edema. IMPRESSION: 1. Stable cardiomegaly. 2. Stable bibasilar opacities. Electronically Signed   By: Nolon Nations M.D.   On: 01/07/2017 13:55   Dg Chest Port 1 View  Result Date: 01/05/2017 CLINICAL DATA:  Shortness of breath and congestion. EXAM: PORTABLE CHEST 1 VIEW COMPARISON:  01/02/2017 FINDINGS: Stable top-normal heart size. Improved aeration at the lung bases with less prominent bibasilar atelectasis. There is no evidence of pulmonary edema, consolidation, pneumothorax, nodule or pleural fluid. IMPRESSION: Improved aeration with less prominent bibasilar atelectasis.  Electronically Signed   By: Aletta Edouard M.D.   On: 01/05/2017 14:59   Dg Chest Port 1 View  Result Date: 01/02/2017 CLINICAL DATA:  Shortness of breath. EXAM: PORTABLE CHEST 1 VIEW COMPARISON:  Radiograph of December 31, 2016. FINDINGS: Stable cardiomegaly. No pneumothorax or pleural effusion is noted. Stable bibasilar subsegmental atelectasis or infiltrates are noted. Bony thorax is unremarkable. IMPRESSION: Stable bibasilar subsegmental atelectasis or infiltrates. Electronically Signed   By: Marijo Conception, M.D.   On: 01/02/2017 10:35   Dg Chest Port 1 View  Result Date: 12/31/2016 CLINICAL DATA:  Shortness of breath. EXAM: PORTABLE CHEST 1 VIEW COMPARISON:  Radiograph of December 30, 2016. FINDINGS: Stable cardiomegaly. No pneumothorax is noted. Mild bibasilar atelectasis or infiltrates are noted, right greater than left. No definite pleural effusion is noted. Bony thorax is unremarkable. IMPRESSION: Mild bibasilar subsegmental atelectasis or infiltrates are noted. Electronically Signed   By: Marijo Conception, M.D.   On: 12/31/2016 09:54   Dg Chest Port 1 View  Result Date: 12/30/2016 CLINICAL DATA:  Shortness of breath. EXAM: PORTABLE CHEST 1 VIEW COMPARISON:  12/28/2016 FINDINGS: Bilateral mild  diffuse interstitial thickening. No focal consolidation, pleural effusion or pneumothorax. Stable cardiomegaly. No acute osseous abnormality. Lytic lesion in the proximal humeral diaphysis partially visualize measuring 2.7 x 1.1 cm of indeterminate etiology. IMPRESSION: 1. Cardiomegaly with mild pulmonary vascular congestion. 2. Lytic lesion in the proximal humeral diaphysis partially visualize measuring 2.7 x 1.1 cm of indeterminate etiology. Recommend dedicated x-rays of the left humerus. Electronically Signed   By: Kathreen Devoid   On: 12/30/2016 09:50   Dg Chest Port 1 View  Result Date: 12/28/2016 CLINICAL DATA:  Acute respiratory failure. EXAM: PORTABLE CHEST 1 VIEW COMPARISON:  Radiograph of December 27, 2016. FINDINGS: Stable cardiomegaly. No pneumothorax is noted. Elevated left hemidiaphragm is noted. Stable central pulmonary vascular congestion is noted. Mildly increased interstitial densities are noted throughout the right lung concerning for pulmonary edema. Bony thorax is unremarkable. No significant pleural effusion is noted. IMPRESSION: Stable cardiomegaly and central pulmonary vascular congestion. Mildly increased interstitial densities are noted throughout the right lung concerning for pulmonary edema. Electronically Signed   By: Marijo Conception, M.D.   On: 12/28/2016 07:45   Dg Chest Port 1 View  Result Date: 12/27/2016 CLINICAL DATA:  77 year old male with hypoxia and shortness breath. Subsequent encounter. EXAM: PORTABLE CHEST 1 VIEW COMPARISON:  12/26/2016 and 06/09/2016 chest x-ray. FINDINGS: Cardiomegaly with pulmonary vascular congestion and Kerley B lines suggesting mild pulmonary edema. No pneumothorax. Calcified aorta. IMPRESSION: Cardiomegaly. Mild pulmonary edema. Aortic Atherosclerosis (ICD10-I70.0). Electronically Signed   By: Genia Del M.D.   On: 12/27/2016 07:16   Dg Bone Survey Met  Result Date: 01/03/2017 CLINICAL DATA:  Lytic lesion of the left humerus. EXAM:  METASTATIC BONE SURVEY COMPARISON:  Radiographs dated 01/02/2017 and 12/30/2016 FINDINGS: Again noted is the lytic lesion in lateral aspect of the proximal left humeral shaft, measuring 3.6 x 1.0 cm on this exam. There is a 12 x 3 mm lucency in the distal fibular shaft. There is a subtle 9 mm lytic area in one of the temporal bones. No other lytic lesions are noted in the skeleton. 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 There is multilevel degenerative disc and joint disease in the cervical spine. Aortic atherosclerosis. IMPRESSION: Lytic lesions in the left humerus, left fibula, and skull. The possibility of myeloma should be considered. Electronically Signed  By: Lorriane Shire M.D.   On: 01/03/2017 12:02   Dg Humerus Left  Result Date: 01/02/2017 CLINICAL DATA:  Lytic bone lesion on radiography EXAM: LEFT HUMERUS - 2+ VIEW COMPARISON:  None FINDINGS: Proximal to the left deltoid insertion site along the proximal humeral diaphysis there is a 3.4 by 0.8 cm cortical lucent lesion. No overlying periosteal reaction. Chronic irregular appearing spurring along the common extensor tendon and common flexor tendon sites along the elbow. IMPRESSION: 1. Cortical proximal diaphyseal humeral lucency with fairly sharp zone of transition just proximal to the deltoid insertion site. Possibilities might include myeloma, metastatic disease, lymphoma, an old nonossifying fibroma. Correlate with any serologic indicators of myeloma and consider further skeletal workup or humerus MRI. Electronically Signed   By: Van Clines M.D.   On: 01/02/2017 12:42   Dg Swallowing Func-speech Pathology  Result Date: 01/09/2017 Objective Swallowing Evaluation: Type of Study: MBS-Modified Barium Swallow Study  Patient Details Name: KING PINZON MRN: [id number] Date of Birth: 1940-07-15 Today's Date: 01/09/2017 Time: SLP Start Time (ACUTE ONLY): 1150 -SLP Stop Time (ACUTE ONLY): 1215 SLP  Time Calculation (min) (ACUTE ONLY): 25 min Past Medical History: Past Medical History: Diagnosis Date . Atrial fibrillation ([place])  . COPD (chronic obstructive pulmonary disease) ([place])  . History of alcohol abuse  . Hypertension  Past Surgical History: Past Surgical History: Procedure Laterality Date . ESOPHAGOGASTRODUODENOSCOPY N/A 12/27/2016  Procedure: ESOPHAGOGASTRODUODENOSCOPY (EGD);  Surgeon: Gatha Mayer, MD;  Location: [facility] ENDOSCOPY;  Service: Endoscopy;  Laterality: N/A; . NO PAST SURGERIES   HPI: Pt is a 77 yo M with heavy ETOH abuse with likely UGIB and significant anemia. Lung exam suggestive of RLL consolidation. Per the family, he was seen at [facility] 2 days ago; was told his Hb was low but left AMA. He was also at [facility] 2-3 weeks ago and was intubated for ETOH withdrawal. UGI 12/17 showed non-bleeding gastric ulcer. PMH also includes COPD, a fib, and HTN.  Subjective: I'm thirsty Assessment / Plan / Recommendation CHL IP CLINICAL IMPRESSIONS 01/09/2017 Clinical Impression Patient presents with moderate risk for aspiration and mild-moderate pharyngoesophageal dysphagia with suspected primary esophageal component. There was intermittent laryngeal penetration during the swallow of all liquid consistencies (thin, nectar and honey), though no frank aspiration observed pt is at risk for aspiration given consistent penetration. During the exam, he had difficulty following commands to cough or clear throat for airway clearance, though when he did follow this command once it was effective. Penetration occurs primarily due to decreased laryngeal closure, though it also occurs with esophageal backflow which spills from the pyriforms during subsequent swallows. A sweep to the esophagus revealed slow clearance and to and fro movements of contrast. MBS does not diagnose dysphagia below the level of the UES, but I suspect esophageal pressures could be impacting pharyngeal phase. Following the exam, I  spoke with pt and his sons, and pt's wife via phone to educate re: aspiration risks and possible treatment options. Pt was very clear in verbalizing his wish to eat and drink by mouth, accepting risks for aspiration. Wife and sons are also in agreement that pt would not want a feeding tube. Pt's respiratory status has been improving. I recommend regular diet with thin liquids via single sips, with cough or throat clear after swallow to clear laryngeal vestibule. In follow-up pt was able to follow commands consistently for slow rate and hard cough after swallow. Pt and family verbalized understanding of risks for aspiration, importance of upright  posture and slow rate, oral care before and after meals to minimize risks. Will follow up briefly for education.  SLP Visit Diagnosis Dysphagia, pharyngeal phase (R13.13);Dysphagia, pharyngoesophageal phase (R13.14) Attention and concentration deficit following -- Frontal lobe and executive function deficit following -- Impact on safety and function Moderate aspiration risk   CHL IP TREATMENT RECOMMENDATION 01/09/2017 Treatment Recommendations Therapy as outlined in treatment plan below   Prognosis 01/09/2017 Prognosis for Safe Diet Advancement Good Barriers to Reach Goals -- Barriers/Prognosis Comment -- CHL IP DIET RECOMMENDATION 01/09/2017 SLP Diet Recommendations Regular solids;Thin liquid Liquid Administration via Cup;Straw Medication Administration Whole meds with puree Compensations Slow rate;Small sips/bites;Hard cough after swallow Postural Changes Remain semi-upright after after feeds/meals (Comment);Seated upright at 90 degrees   CHL IP OTHER RECOMMENDATIONS 01/09/2017 Recommended Consults -- Oral Care Recommendations Oral care before and after PO Other Recommendations Clarify dietary restrictions   CHL IP FOLLOW UP RECOMMENDATIONS 01/09/2017 Follow up Recommendations None   CHL IP FREQUENCY AND DURATION 01/09/2017 Speech Therapy Frequency (ACUTE ONLY) min 1  x/week Treatment Duration 1 week      CHL IP ORAL PHASE 01/09/2017 Oral Phase WFL Oral - Pudding Teaspoon -- Oral - Pudding Cup -- Oral - Honey Teaspoon -- Oral - Honey Cup -- Oral - Nectar Teaspoon -- Oral - Nectar Cup -- Oral - Nectar Straw -- Oral - Thin Teaspoon -- Oral - Thin Cup -- Oral - Thin Straw -- Oral - Puree -- Oral - Mech Soft -- Oral - Regular -- Oral - Multi-Consistency -- Oral - Pill -- Oral Phase - Comment --  CHL IP PHARYNGEAL PHASE 01/09/2017 Pharyngeal Phase Impaired Pharyngeal- Pudding Teaspoon -- Pharyngeal -- Pharyngeal- Pudding Cup -- Pharyngeal -- Pharyngeal- Honey Teaspoon -- Pharyngeal -- Pharyngeal- Honey Cup Delayed swallow initiation-vallecula;Reduced airway/laryngeal closure;Penetration/Aspiration during swallow Pharyngeal Material enters airway, remains ABOVE vocal cords and not ejected out Pharyngeal- Nectar Teaspoon -- Pharyngeal -- Pharyngeal- Nectar Cup Delayed swallow initiation-vallecula;Reduced airway/laryngeal closure;Penetration/Aspiration during swallow Pharyngeal Material enters airway, CONTACTS cords and not ejected out Pharyngeal- Nectar Straw -- Pharyngeal -- Pharyngeal- Thin Teaspoon -- Pharyngeal -- Pharyngeal- Thin Cup Delayed swallow initiation-vallecula;Reduced airway/laryngeal closure;Penetration/Aspiration during swallow Pharyngeal Material enters airway, CONTACTS cords and not ejected out Pharyngeal- Thin Straw -- Pharyngeal -- Pharyngeal- Puree Delayed swallow initiation-vallecula Pharyngeal -- Pharyngeal- Mechanical Soft -- Pharyngeal -- Pharyngeal- Regular Delayed swallow initiation-vallecula Pharyngeal -- Pharyngeal- Multi-consistency -- Pharyngeal -- Pharyngeal- Pill NT Pharyngeal -- Pharyngeal Comment --  CHL IP CERVICAL ESOPHAGEAL PHASE 01/09/2017 Cervical Esophageal Phase Impaired Pudding Teaspoon -- Pudding Cup -- Honey Teaspoon -- Honey Cup Esophageal backflow into cervical esophagus;Esophageal backflow into the pharynx Nectar Teaspoon -- Nectar Cup  Esophageal backflow into cervical esophagus;Esophageal backflow into the pharynx Nectar Straw -- Thin Teaspoon -- Thin Cup Esophageal backflow into cervical esophagus;Esophageal backflow into the pharynx Thin Straw -- Puree Esophageal backflow into cervical esophagus;Esophageal backflow into the pharynx Mechanical Soft -- Regular -- Multi-consistency -- Pill -- Cervical Esophageal Comment -- No flowsheet data found. Aliene Altes 01/09/2017, 2:15 PM  Deneise Lever, MS, CCC-SLP Speech-Language Pathologist [phone]                     Echocardiogram Study Conclusions  - Left ventricle: The cavity size was normal. Systolic function was   vigorous. The estimated ejection fraction was in the range of 65%   to 70%. Wall motion was normal; there were no regional wall   motion abnormalities. The study is not technically sufficient to  allow evaluation of LV diastolic function. - Left atrium: The atrium was mildly dilated. - Right atrium: The atrium was moderately dilated. - Atrial septum: There was increased thickness of the septum,   consistent with lipomatous hypertrophy.  Leg doppler US Final Interpretation Right: There is no evidence of deep vein thrombosis in the lower extremity. However, portions of this examination were limited- see technologist comments above. No cystic structure found in the popliteal fossa.    EGD 12/27/2016 One non-bleeding cratered gastric ulcer with no stigmata of bleeding was found in the prepyloric region of the stomach. The lesion was 10 mm in largest dimension. Biopsies were taken with a cold forceps for histology. Verification of patient identification for the specimen was done. Estimated blood loss was minimal. Findings: The exam was otherwise without abnormality. The cardia and gastric fundus were normal on retroflexion. - Non-bleeding gastric ulcer with no stigmata of bleeding. Biopsied. - The examination was otherwise  normal.          Subjective: Feeling great.  No dyspnea, chest pain, orthopnea, leg swelling.  Still a lot of congestion, coughing, but no change in sputum, worsening in breathing.    Discharge Exam: Vitals:   01/17/17 0950 01/17/17 1211  BP:  (!) 128/59  Pulse:    Resp:    Temp:  98.5 F (36.9 C)  SpO2: 90% 95%   Vitals:   01/17/17 0907 01/17/17 0911 01/17/17 0950 01/17/17 1211  BP:  134/63  (!) 128/59  Pulse: 85     Resp: 16     Temp:    98.5 F (36.9 C)  TempSrc:    Oral  SpO2: 94% 90% 90% 95%  Weight:      Height:        General: Pt is alert, awake, not in acute distress, on O2 Cardiovascular: RRR, S1/S2 +, no rubs, no gallops Respiratory: CTA bilaterally, coarse airway sounds, and wheezing throughout Abdominal: Soft, NT, ND, bowel sounds + Extremities: no edema, no cyanosis    The results of significant diagnostics from this hospitalization (including imaging, microbiology, ancillary and laboratory) are listed below for reference.     Microbiology: No results found for this or any previous visit (from the past 240 hour(s)).   Labs: BNP (last 3 results) Recent Labs    12/30/16 0908  BNP 130.8*   Basic Metabolic Panel: Recent Labs  Lab 01/12/17 0354 01/14/17 0405 01/15/17 0301 01/16/17 0251 01/17/17 0406  NA 134* 132* 133* 125* 127*  K 3.7 3.3* 4.2 4.2 4.7  CL 94* 91* 92* 87* 89*  CO2 33* 33* 33* 32 30  GLUCOSE 108* 118* 211* 198* 230*  BUN 20 31* 28* 30* 24*  CREATININE 1.08 1.07 1.00 1.31* 1.07  CALCIUM 8.8* 8.6* 8.7* 8.7* 8.8*   Liver Function Tests: Recent Labs  Lab 01/11/17 1032 01/12/17 0354  AST 25 16  ALT 27 24  ALKPHOS 60 58  BILITOT 0.5 0.5  PROT 5.7* 5.6*  ALBUMIN 2.4* 2.3*   No results for input(s): LIPASE, AMYLASE in the last 168 hours. Recent Labs  Lab 01/11/17 1032  AMMONIA 13   CBC: Recent Labs  Lab 01/12/17 0354 01/14/17 0405 01/15/17 0301 01/16/17 0251 01/17/17 0406  WBC 9.6 13.6* 15.4* 19.3*  16.5*  HGB 9.1* 8.7* 9.4* 9.1* 9.3*  HCT 29.8* 27.9* 30.9* 29.1* 29.9*  MCV 90.9 88.3 89.6 87.7 87.7  PLT 261 274 266 261 261   Cardiac Enzymes: No results for input(s): CKTOTAL, CKMB, CKMBINDEX, TROPONINI  in the last 168 hours. BNP: Invalid input(s): POCBNP CBG: Recent Labs  Lab 01/11/17 1147 01/12/17 0407  GLUCAP 90 106*   D-Dimer No results for input(s): DDIMER in the last 72 hours. Hgb A1c No results for input(s): HGBA1C in the last 72 hours. Lipid Profile No results for input(s): CHOL, HDL, LDLCALC, TRIG, CHOLHDL, LDLDIRECT in the last 72 hours. Thyroid function studies No results for input(s): TSH, T4TOTAL, T3FREE, THYROIDAB in the last 72 hours.  Invalid input(s): FREET3 Anemia work up No results for input(s): VITAMINB12, FOLATE, FERRITIN, TIBC, IRON, RETICCTPCT in the last 72 hours. Urinalysis    Component Value Date/Time   COLORURINE YELLOW 12/31/2016 [place] 12/31/2016 0934   LABSPEC 1.020 12/31/2016 0934   PHURINE 5.5 12/31/2016 0934   GLUCOSEU NEGATIVE 12/31/2016 0934   HGBUR MODERATE (A) 12/31/2016 0934   BILIRUBINUR NEGATIVE 12/31/2016 0934   KETONESUR 15 (A) 12/31/2016 0934   PROTEINUR NEGATIVE 12/31/2016 0934   NITRITE NEGATIVE 12/31/2016 0934   LEUKOCYTESUR NEGATIVE 12/31/2016 0934   Sepsis Labs Invalid input(s): PROCALCITONIN,  WBC,  LACTICIDVEN Microbiology No results found for this or any previous visit (from the past 240 hour(s)).   Time coordinating discharge: Over 30 minutes  SIGNED:   Edwin Dada, MD  [facility] 01/17/2017, 1:41 PM

## 2017-01-17 NOTE — Progress Notes (Signed)
PT Cancellation Note  Patient Details Name: [name] [name] MRN: [id number] DOB: 14-Feb-1940   Cancelled Treatment:    Reason Eval/Treat Not Completed: Medical issues which prohibited therapy. Noted events of the morning. Pt currently on 8L/min O2 via HFNC, and appears lethargic. Will hold at this time and check back for readiness to participate with PT.    [name] [name] 01/17/2017, 9:44 AM   Conni SlipperLaura Samya [name], PT, DPT Acute Rehabilitation Services Pager: [phone]

## 2017-01-17 NOTE — Progress Notes (Signed)
Report called to Gates Riggrebecca Thaw LPN

## 2017-01-17 NOTE — Progress Notes (Signed)
Patient will DC [facility]: [facility] Anticipated DC date: 01/17/17 Family notified: Spouse Transport by: Sharin MonsPTAR    Per MD patient ready for [facility]. RN, patient, patient's family, and facility notified of DC. Discharge Summary sent to facility. RN given number for report. DC packet on chart. Ambulance transport requested for patient.   CSW signing off.  Cristobal GoldmannNadia Velena Keegan, [place]WA Clinical Social Worker [phone]

## 2017-02-04 ENCOUNTER — Other Ambulatory Visit: Payer: Self-pay

## 2017-02-04 ENCOUNTER — Encounter (HOSPITAL_COMMUNITY): Payer: Self-pay | Admitting: Emergency Medicine

## 2017-02-04 ENCOUNTER — Emergency Department (HOSPITAL_COMMUNITY)
Admission: EM | Admit: 2017-02-04 | Discharge: 2017-02-04 | Disposition: A | Discharge status: Home / Self Care | Payer: Medicare Other | Attending: Emergency Medicine | Admitting: Emergency Medicine

## 2017-02-04 DIAGNOSIS — R42 Dizziness and giddiness: Secondary | ICD-10-CM | POA: Insufficient documentation

## 2017-02-04 DIAGNOSIS — Z5321 Procedure and treatment not carried out due to patient leaving prior to being seen by health care provider: Secondary | ICD-10-CM | POA: Diagnosis not present

## 2017-02-04 HISTORY — DX: Chronic or unspecified gastrojejunal ulcer with hemorrhage: K28.4

## 2017-02-04 HISTORY — DX: Chronic or unspecified peptic ulcer, site unspecified, with hemorrhage: K27.4

## 2017-02-04 LAB — CBC
HCT: 27.6 % — ABNORMAL LOW (ref 39.0–52.0)
Hemoglobin: 8.7 g/dL — ABNORMAL LOW (ref 13.0–17.0)
MCH: 28.2 pg (ref 26.0–34.0)
MCHC: 31.5 g/dL (ref 30.0–36.0)
MCV: 89.6 fL (ref 78.0–100.0)
PLATELETS: 171 10*3/uL (ref 150–400)
RBC: 3.08 MIL/uL — ABNORMAL LOW (ref 4.22–5.81)
RDW: 17.8 % — AB (ref 11.5–15.5)
WBC: 6.6 10*3/uL (ref 4.0–10.5)

## 2017-02-04 LAB — BASIC METABOLIC PANEL
Anion gap: 9 (ref 5–15)
BUN: 17 mg/dL (ref 6–20)
CHLORIDE: 97 mmol/L — AB (ref 101–111)
CO2: 28 mmol/L (ref 22–32)
CREATININE: 1.01 mg/dL (ref 0.61–1.24)
Calcium: 8.2 mg/dL — ABNORMAL LOW (ref 8.9–10.3)
GFR calc Af Amer: >60 mL/min (ref >60)
GFR calc non Af Amer: >60 mL/min (ref >60)
GLUCOSE: 106 mg/dL — AB (ref 65–99)
Potassium: 3.7 mmol/L (ref 3.5–5.1)
SODIUM: 134 mmol/L — AB (ref 135–145)

## 2017-02-04 NOTE — ED Triage Notes (Signed)
Patient has had two episodes of "light headedness" in past two days with standing. Physical therapist sent him to ED for possible hypotension. Patient recently had bleeding ulcer.

## 2017-02-08 ENCOUNTER — Telehealth: Payer: Self-pay | Admitting: Internal Medicine

## 2017-02-08 ENCOUNTER — Ambulatory Visit: Payer: Medicare Other | Admitting: Nurse Practitioner

## 2017-02-08 NOTE — Telephone Encounter (Signed)
Please advise Sir, thank you. 

## 2017-02-10 ENCOUNTER — Ambulatory Visit: Payer: Medicare Other | Admitting: Gastroenterology

## 2017-02-10 NOTE — Telephone Encounter (Signed)
He needs to always be on pantoprazole 40 mg every AM before breakfast

## 2017-02-10 NOTE — Telephone Encounter (Signed)
I spoke with [name] [name] and he is taking his pantoprazole 40mg  daily before breakfast.  He started back on it this past Saturday after not taking it several weeks in the rehab facility. He has plenty right now and will call the drugstore when he needs more.

## 2017-02-14 ENCOUNTER — Encounter: Payer: Self-pay | Admitting: Nurse Practitioner

## 2017-02-14 ENCOUNTER — Ambulatory Visit (INDEPENDENT_AMBULATORY_CARE_PROVIDER_SITE_OTHER): Payer: Medicare Other | Admitting: Nurse Practitioner

## 2017-02-14 VITALS — BP 120/60 | HR 84 | Ht 67.0 in | Wt 180.0 lb

## 2017-02-14 DIAGNOSIS — K7460 Unspecified cirrhosis of liver: Secondary | ICD-10-CM | POA: Diagnosis not present

## 2017-02-14 DIAGNOSIS — K279 Peptic ulcer, site unspecified, unspecified as acute or chronic, without hemorrhage or perforation: Secondary | ICD-10-CM | POA: Diagnosis not present

## 2017-02-14 MED ORDER — PANTOPRAZOLE SODIUM 40 MG PO TBEC: 40.0000 mg | DELAYED_RELEASE_TABLET | Freq: Every day | ORAL | 1 refills | Status: DC

## 2017-02-14 NOTE — Patient Instructions (Signed)
If you are age 365 or older, your body mass index should be between 23-30. Your Body mass index is 28.19 kg/m. If this is out of the aforementioned range listed, please consider follow up with your Primary Care Provider.  If you are age 77 or younger, your body mass index should be between 19-25. Your Body mass index is 28.19 kg/m. If this is out of the aformentioned range listed, please consider follow up with your Primary Care Provider.   We have sent the following medications to your pharmacy for you to pick up at your convenience: Pantoprazole  Follow up in six months.  We will contact you regarding an appointment in 6 months.  Thank you for choosing me and [facility].   Willette ClusterPaula Guenther, NP

## 2017-02-14 NOTE — Progress Notes (Addendum)
He had      IMPRESSION and PLAN:    4. 77 yo male recently hospitalized with UGI bleed on Xarelto and also NSAID use. Non-bleeding gastric ulcer on EGD by Dr. Carlean Purl, biopsies negative.  -Xarelto still on hold, hasn't seen Cardiology yet. -continue daily PPI. Refills given.  -continue daily iron. PCP can monitor CBCs and guide iron tx.  -NSAIDS were already stopped.  -Call us asap for any recurrent bleeding  2. Macrocytic anemia, probably multifactorial (b12 def, GI bleed, liver disease).  Hgb mid 7 to mid 8 range.  No further overt GI bleeding and on iron and B12 supplement now.  -PCP can check CBC in 4-6 weeks to guide iron therapy  -B12  deficiency, it was 77 at [facility] in November. Now on daily oral supplement   -RBC folate is normal.   3. 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 ANA, ceruloplasmin negative. HBV, HCV negative. No evidence for portal htn ( No esophageal varices / ascites / no HE).  No focal liver lesions on u/s at [facility] in late November.  -HBV surface antibody is negative, Needs HBV vaccination. Gave copy of viral hepatitis studies to family to give to PCP, I believe patient has an appt on Monday.  -follow up with me in 6 months or sooner if needed. -strongly advised that he abstain from ETOH -will need AFP at follow up. I don't see that one was done at [facility] nor during recent admission.   4. Humerous lesion on imaging.Marland Kitchen He sees PCP on Monday. - I don't know if PCP has access to Epic so I have family copy of xray report to give to PCP to see if further evaluation warranted.    HPI:    Chief Complaint:  Hospital follow-up  Patient is a 77 year old male with AFIB, ETOH abuse, reported COPD. He was hospitalized at [facility] late November with melena, ETOH withdrawal / acute respiratory failure requiring intubation.. Ferritin was 171, hgb in mid 8 range. Baseline hgb not known. GI evaluated,  per record the patient refused EGD. He was treated with PPI.  B12 found to be low at 77,  started on IM B12.    Patient re-hospitalized, this time in [place] in mid December which is when we met him. Patient presented with melena / anemia on Xarelto.. Inpatient EGD remarkable for a nonbleeding gastric ulcer. Biopsies benign, no H. Pylori. Patient had been taking Ibuprofen, asa and drinking ETOH (1-4 drinks a day). He did require two units of blood. Hgb on admission was 7.0 , by discharge on 01/17/17 it was 9.3.  He was discharged to a rehab facility, apparently didn't received PPI.  A couple of weeks later patient became weak, was taken to ED in [place]. Per family, hgb was down in mid 7 range again. Marland Kitchen He had not had any overt GI bleeding. Hospitalized in [place] a couple of days, not transfused.  He remains off Xarelto since discharge from [facility] early January.  Taking iron.   Max feels okay. No nausea. No abdominal pain. He is taking Pantoprazole before breakfast. No NSAID use  He has no desire to drink ETOH but doesn't plan to avoid it all together in the future.    Past Medical History:  Diagnosis Date  . Acute respiratory failure ([place])   . Anemia    low Hgb  . Atrial fibrillation ([place])   . Bleeding ulcer   . COPD (chronic obstructive pulmonary disease) ([place])   . GI bleed   .  History of alcohol abuse   . Hypertension     Patient's surgical history, family medical history, social history, medications and allergies were all reviewed in Epic     Physical Exam:     BP 120/60 (BP Location: Left Arm, Patient Position: Sitting, Cuff Size: Normal)   Pulse 84 Comment: irregular  Ht _0  (1.702 m)   Wt 180 lb (81.6 kg)   BMI 28.19 kg/m   GENERAL:  Well developed white elderly male in wheelchair in NAD PSYCH: :Pleasant, cooperative, normal affect EENT:  conjunctiva pink, mucous membranes moist, neck supple without masses CARDIAC:  RRR, , no peripheral edema PULM: Normal respiratory effort, lungs CTA bilaterally, no wheezing ABDOMEN:  Nondistended, soft, nontender. No obvious  masses, but limited exam in wheelchair,  normal bowel sounds SKIN:  turgor, no lesions seen Musculoskeletal:  Normal muscle tone, normal strength NEURO: Alert and oriented x 3, no focal neurologic deficits   Tye Savoy , NP 02/14/2017, 3:42 PM  Agree with Ms. Vanita Ingles assessment and plan. Gatha Mayer, MD, [facility]

## 2017-02-16 ENCOUNTER — Encounter: Payer: Self-pay | Admitting: Nurse Practitioner

## 2017-03-18 ENCOUNTER — Encounter: Payer: Self-pay | Admitting: Cardiovascular Disease

## 2017-03-18 ENCOUNTER — Ambulatory Visit (INDEPENDENT_AMBULATORY_CARE_PROVIDER_SITE_OTHER): Payer: Medicare Other | Admitting: Cardiovascular Disease

## 2017-03-18 VITALS — BP 102/64 | HR 86 | Ht 67.0 in | Wt 186.0 lb

## 2017-03-18 DIAGNOSIS — I5032 Chronic diastolic (congestive) heart failure: Secondary | ICD-10-CM | POA: Diagnosis not present

## 2017-03-18 DIAGNOSIS — I10 Essential (primary) hypertension: Secondary | ICD-10-CM | POA: Diagnosis not present

## 2017-03-18 DIAGNOSIS — K254 Chronic or unspecified gastric ulcer with hemorrhage: Secondary | ICD-10-CM

## 2017-03-18 DIAGNOSIS — I482 Chronic atrial fibrillation: Secondary | ICD-10-CM

## 2017-03-18 DIAGNOSIS — G4733 Obstructive sleep apnea (adult) (pediatric): Secondary | ICD-10-CM

## 2017-03-18 DIAGNOSIS — Z720 Tobacco use: Secondary | ICD-10-CM

## 2017-03-18 DIAGNOSIS — Z9289 Personal history of other medical treatment: Secondary | ICD-10-CM | POA: Diagnosis not present

## 2017-03-18 DIAGNOSIS — I4821 Permanent atrial fibrillation: Secondary | ICD-10-CM

## 2017-03-18 MED ORDER — RIVAROXABAN 20 MG PO TABS: 20.0000 mg | ORAL_TABLET | Freq: Every day | ORAL | Status: AC

## 2017-03-18 NOTE — Progress Notes (Signed)
SUBJECTIVE: The patient presents for routine follow-up.  He was hospitalized for acute blood loss anemia with a hemoglobin of 6.5 in December 2018-January 2019.  He was transfused 3 units of packed red blood cells and started on a proton pump inhibitor.  Xarelto was stopped.  He also developed hypoxic respiratory failure with acute on chronic diastolic heart failure.  He also had a COPD exacerbation.  Echocardiogram 12/31/16 demonstrated vigorous left ventricular systolic function and normal regional wall motion, LVEF 65-70%.  Lower extremity Doppler showed no evidence of right leg DVT.  He underwent an EGD on 12/27/16 which showed a nonbleeding cratered gastric ulcer measuring 10 mm in maximum diameter.  He is taking Protonix 40 mg daily.  He has yet to restart Xarelto.  He tells me his hemoglobin was 10.8 when last checked about 2 weeks ago by his PCP.  He is scheduled for a repeat CBC on 03/29/17.  He denies chest pain, orthopnea, and leg swelling.  He previously had some leg swelling and took Lasix daily for a few days with complete resolution.  His chronic exertional dyspnea related to COPD is stable.    Review of Systems: As per "subjective", otherwise negative.  No Known Allergies  Current Outpatient Medications  Medication Sig Dispense Refill  . albuterol (PROVENTIL HFA;VENTOLIN HFA) 108 (90 Base) MCG/ACT inhaler Inhale 1-2 puffs into the lungs every 4 (four) hours as needed for wheezing or shortness of breath.    Marland Kitchen. albuterol (PROVENTIL) (2.5 MG/3ML) 0.083% nebulizer solution Take 2.5 mg by nebulization every 6 (six) hours as needed for wheezing or shortness of breath.    . CARTIA XT 120 MG 24 hr capsule TAKE 1 CAPSULE BY MOUTH EVERY MORNING 90 capsule 6  . cetirizine (ZYRTEC) 10 MG tablet Take 10 mg by mouth daily.    Marland Kitchen. dextromethorphan-guaiFENesin (MUCINEX DM) 30-600 MG 12hr tablet Take 2 tablets by mouth 2 (two) times daily. (Patient taking differently: Take 1 tablet by  mouth 2 (two) times daily. ) 60 tablet 0  . ferrous sulfate 325 (65 FE) MG tablet Take 1 tablet (325 mg total) by mouth daily. 30 tablet 0  . fluticasone (FLONASE) 50 MCG/ACT nasal spray Place 2 sprays into both nostrils daily.    . furosemide (LASIX) 40 MG tablet Take 1 tablet (40 mg total) by mouth daily as needed for edema (Or weight gain >5 lbs in 2 days). 30 tablet 0  . Melatonin 3 MG TABS Take 1 tablet (3 mg total) by mouth at bedtime as needed (sleep). 30 tablet 0  . oxyCODONE-acetaminophen (PERCOCET/ROXICET) 5-325 MG tablet Take 1 tablet by mouth daily as needed for moderate pain. 6 tablet 0  . pantoprazole (PROTONIX) 40 MG tablet Take 1 tablet (40 mg total) by mouth daily. 90 tablet 1  . potassium chloride SA (K-DUR,KLOR-CON) 20 MEQ tablet Take 40 mEq by mouth daily as needed (on days taking Furosemide).   1  . SPIRIVA HANDIHALER 18 MCG inhalation capsule INL CONTENTS OF 1 C ONCE DAILY USING HANDIHALER  1  . SYMBICORT 160-4.5 MCG/ACT inhaler INHALE 2 PUFFS TWICE DAILY  5  . tamsulosin (FLOMAX) 0.4 MG CAPS capsule TK ONE C PO ONCE TO  BID PRF URINARY FREQUENCY  2   No current facility-administered medications for this visit.     Past Medical History:  Diagnosis Date  . Acute respiratory failure (HCC)   . Anemia    low Hgb  . Atrial fibrillation (H[facility])   .  Bleeding ulcer   . COPD (chronic obstructive pulmonary disease) (H[facility])   . GI bleed   . History of alcohol abuse   . Hypertension     Past Surgical History:  Procedure Laterality Date  . ESOPHAGOGASTRODUODENOSCOPY N/A 12/27/2016   Procedure: ESOPHAGOGASTRODUODENOSCOPY (EGD);  Surgeon: Iva Boop, MD;  Location: [facility] ENDOSCOPY;  Service: Endoscopy;  Laterality: N/A;  . NO PAST SURGERIES      Social History   Socioeconomic History  . Marital status: Married    Spouse name: Not on file  . Number of children: Not on file  . Years of education: Not on file  . Highest education level: Not on file  Social Needs  .  Financial resource strain: Not on file  . Food insecurity - worry: Not on file  . Food insecurity - inability: Not on file  . Transportation needs - medical: Not on file  . Transportation needs - non-medical: Not on file  Occupational History  . Not on file  Tobacco Use  . Smoking status: Current Every Day Smoker    Packs/day: 2.00    Types: Cigarettes    Start date: 11/07/1952    Last attempt to quit: 11/11/2016    Years since quitting: 0.3  . Smokeless tobacco: Never Used  Substance and Sexual Activity  . Alcohol use: Yes    Frequency: Never    Comment: has not since November  . Drug use: No  . Sexual activity: Not on file  Other Topics Concern  . Not on file  Social History Narrative  . Not on file     Vitals:   03/18/17 1503  BP: 102/64  Pulse: 86  SpO2: 98%  Weight: 186 lb (84.4 kg)  Height: 5\' 7"  (1.702 m)    Wt Readings from Last 3 Encounters:  03/18/17 186 lb (84.4 kg)  02/14/17 180 lb (81.6 kg)  02/04/17 169 lb (76.7 kg)     PHYSICAL EXAM General: NAD HEENT: Normal. Neck: No JVD, no thyromegaly. Lungs: Clear to auscultation bilaterally with normal respiratory effort. CV: Regular rate and irregular rhythm, normal S1/S2, no S3, no murmur. No significant pretibial or periankle edema.  No carotid bruit.   Abdomen: Soft, nontender, no distention.  Neurologic: Alert and oriented.  Psych: Normal affect. Skin: Normal. Musculoskeletal: No gross deformities.    ECG: Most recent ECG reviewed.   Labs: Lab Results  Component Value Date/Time   K 3.7 02/04/2017 02:06 PM   BUN 17 02/04/2017 02:06 PM   CREATININE 1.01 02/04/2017 02:06 PM   ALT 24 01/12/2017 03:54 AM   TSH 0.900 01/02/2017 04:48 PM   HGB 8.7 (L) 02/04/2017 02:06 PM     Lipids: No results found for: LDLCALC, LDLDIRECT, CHOL, TRIG, HDL     ASSESSMENT AND PLAN:  1.  Permanent atrial fibrillation: Symptomatically stable with adequate heart rate control on long-acting diltiazem 120  milligrams daily.  Xarelto has been on hold due to GI bleeding.  He is on a proton pump inhibitor.  I asked him to resume Xarelto 20 mg daily.  He told me he is due to have a CBC rechecked on 03/29/17 by his PCP.  2.  Chronic hypertension: Blood pressure is controlled.  No changes to therapy..  3.  Tobacco abuse: Cessation counseling previously provided.  4.  Obstructive sleep apnea: Sleep study previously recommended.  5.  Chronic diastolic heart failure:  He is currently taking Lasix as needed with supplemental potassium.   6. GI  bleed: EGD results noted above. He is on Protonix. Due to have CBC rechecked on 3/19.   Disposition: Follow up 6 months   Prentice Docker, M.D., F.A.C.C.

## 2017-03-18 NOTE — Patient Instructions (Signed)
Medication Instructions:   Resume Xarelto 20mg  daily.   Continue all other medications.    Labwork: Please have family doctor send us copy of CBC that you are doing on 03/29/2017.  Testing/Procedures: none  Follow-Up: Your physician wants you to follow up in: 6 months.  You will receive a reminder letter in the mail one-two months in advance.  If you don't receive a letter, please call our office to schedule the follow up appointment   Any Other Special Instructions Will Be Listed Below (If Applicable).  If you need a refill on your cardiac medications before your next appointment, please call your pharmacy.

## 2017-03-21 ENCOUNTER — Other Ambulatory Visit: Payer: Self-pay

## 2017-03-21 ENCOUNTER — Telehealth: Payer: Self-pay | Admitting: Nurse Practitioner

## 2017-03-21 MED ORDER — PANTOPRAZOLE SODIUM 40 MG PO TBEC: 40.0000 mg | DELAYED_RELEASE_TABLET | Freq: Every day | ORAL | 1 refills | Status: AC

## 2017-03-25 ENCOUNTER — Ambulatory Visit: Payer: Medicare Other | Admitting: Cardiovascular Disease

## 2017-03-25 NOTE — Telephone Encounter (Signed)
Refill sent.

## 2017-06-17 ENCOUNTER — Encounter: Payer: Self-pay | Admitting: Nurse Practitioner

## 2017-08-11 DEATH — deceased

## 2017-08-24 ENCOUNTER — Ambulatory Visit: Payer: Medicare Other | Admitting: Nurse Practitioner

## 2017-08-24 ENCOUNTER — Encounter

## 2017-10-13 ENCOUNTER — Ambulatory Visit: Payer: Medicare Other | Admitting: Cardiovascular Disease

## 2017-10-13 ENCOUNTER — Encounter

## 2019-05-23 IMAGING — CR DG CHEST 1V PORT
2 series · 2 of 2 positions shown · non-contrast
Comparison: Radiograph December 27, 2016.

CLINICAL DATA: Acute respiratory failure.

EXAM:
PORTABLE CHEST 1 VIEW

[AP (1 of 2)]
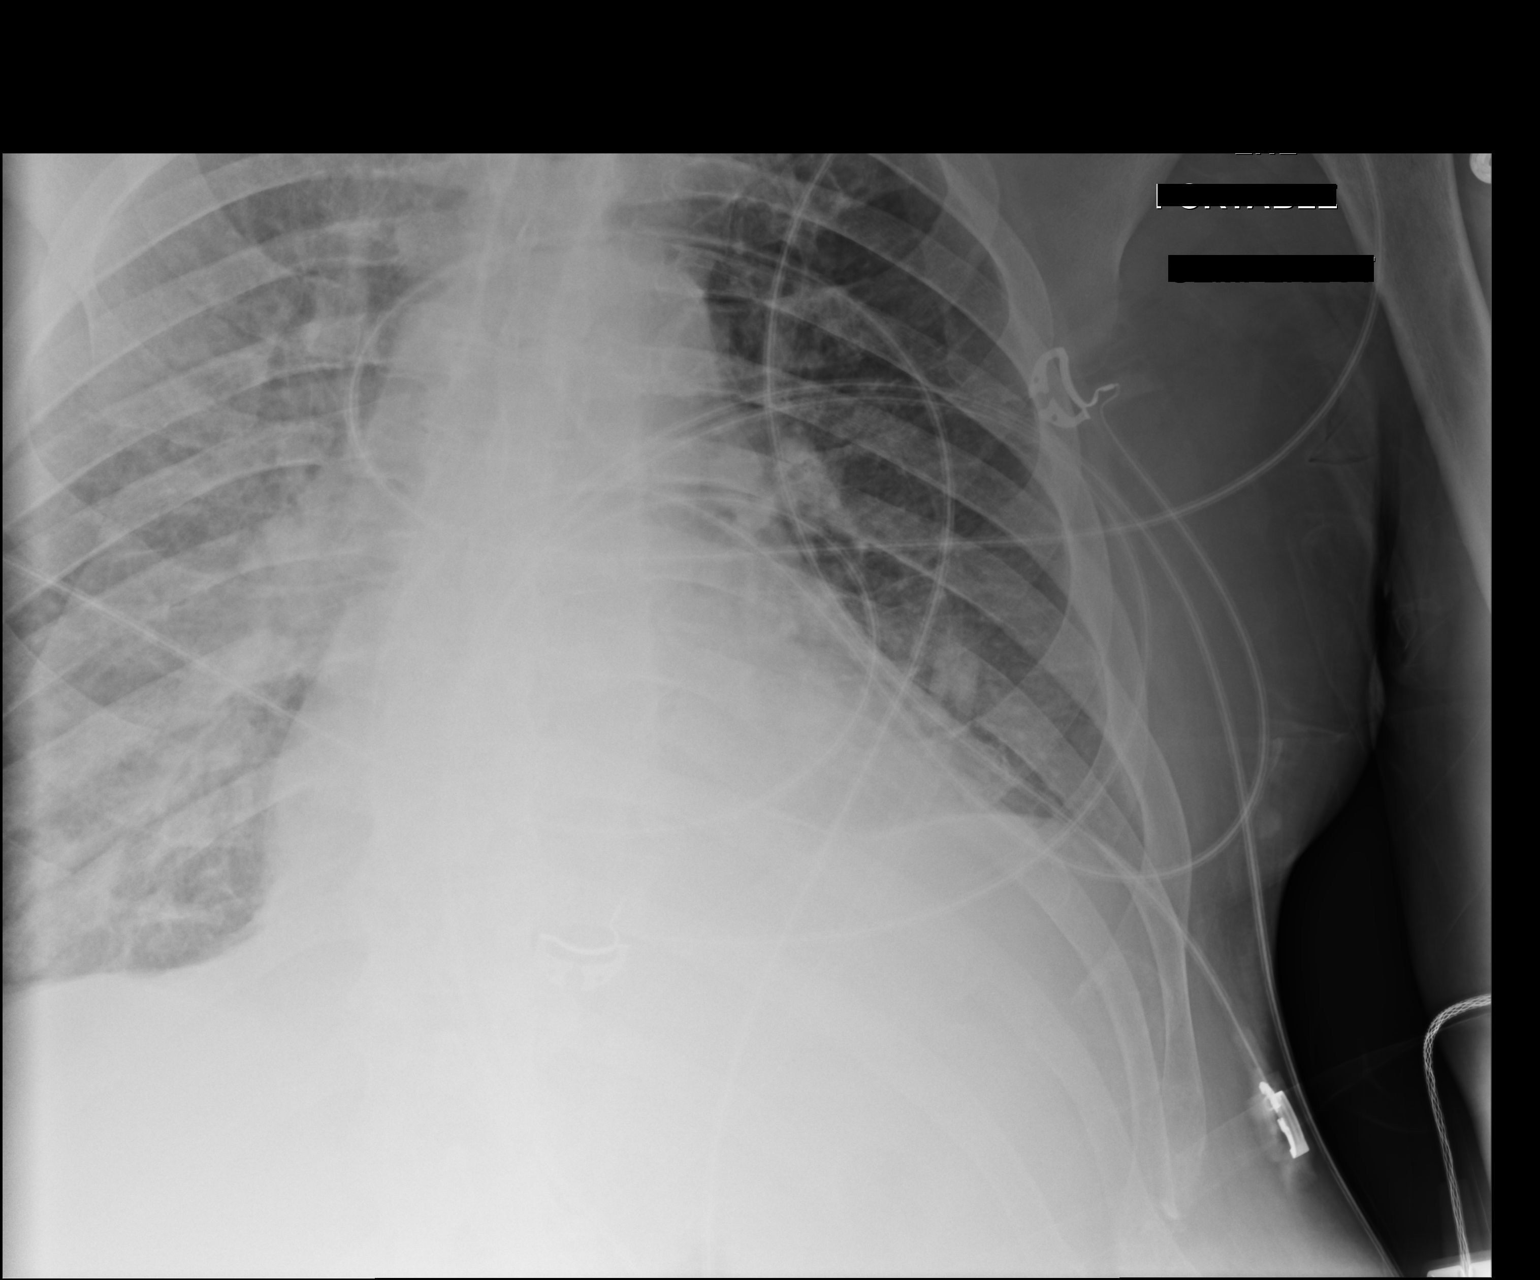

[AP (2 of 2)]
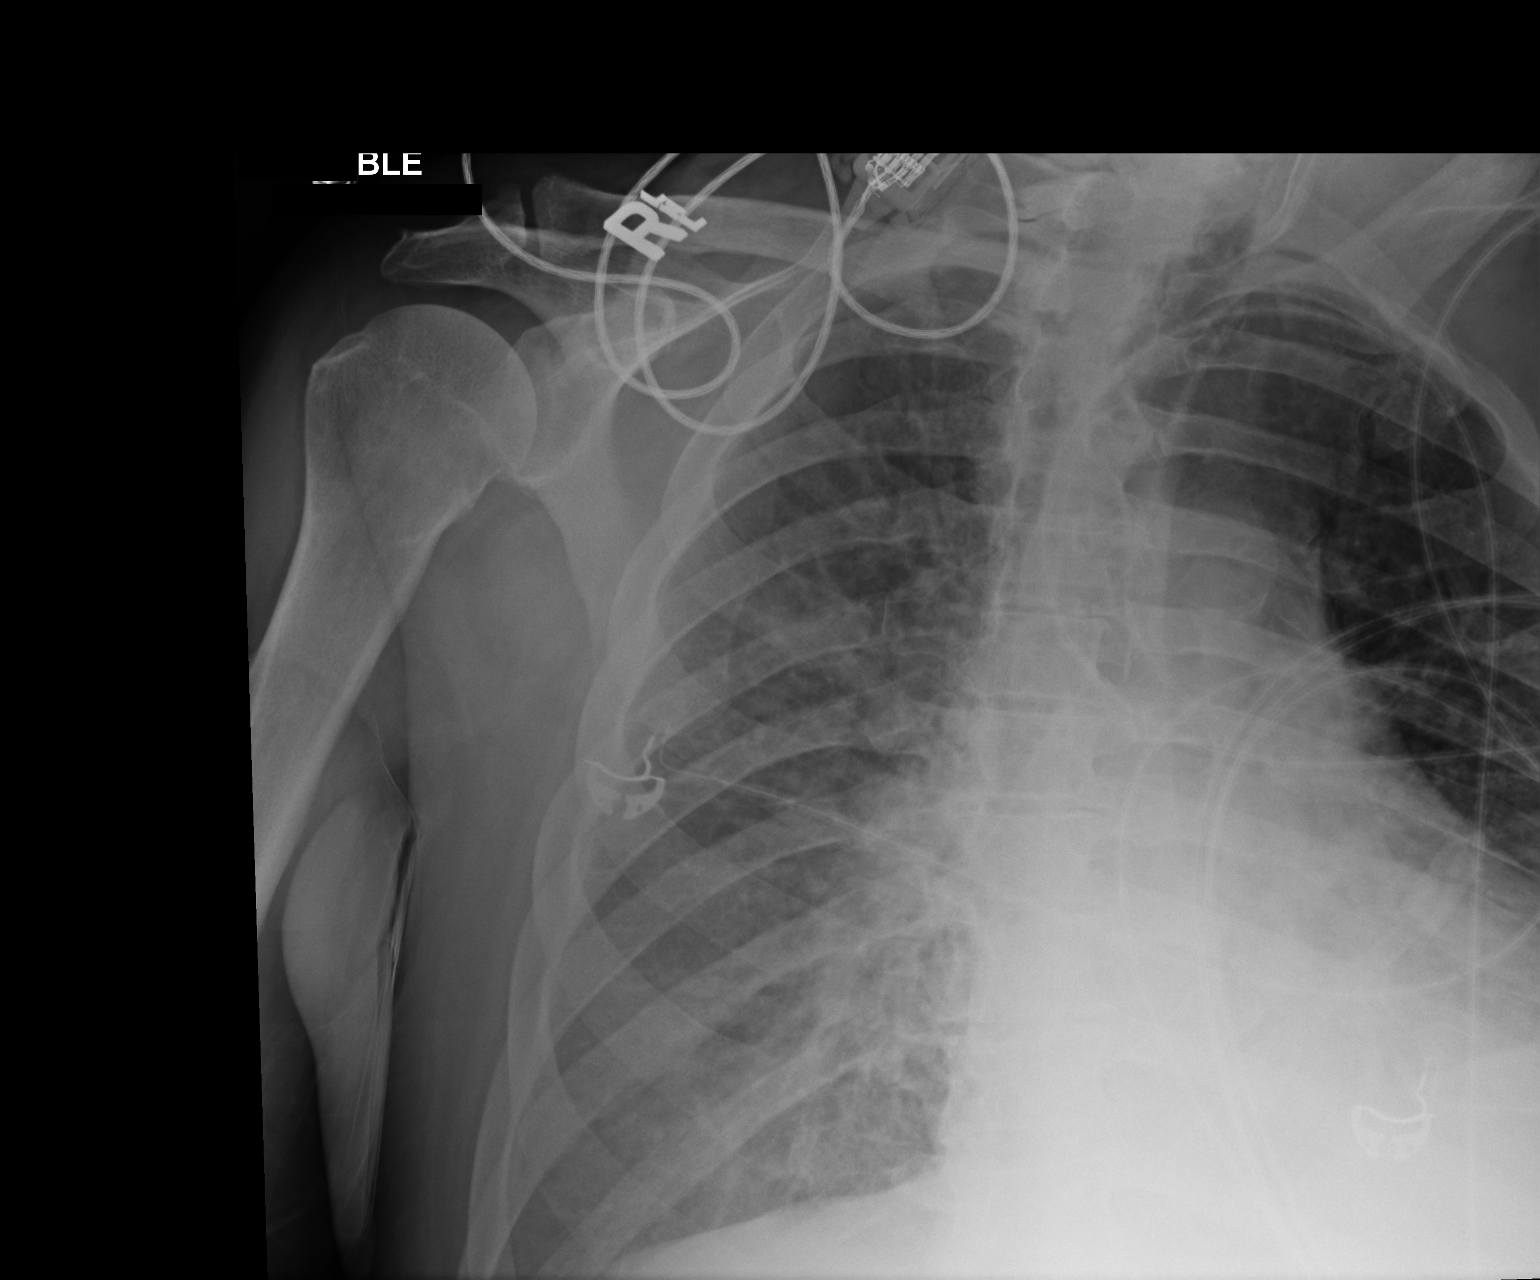

[2 of 2 positions shown; findings below may reference images not displayed]

FINDINGS: Stable cardiomegaly. No pneumothorax is noted. Elevated left
hemidiaphragm is noted. Stable central pulmonary vascular congestion
is noted. Mildly increased interstitial densities are noted
throughout the right lung concerning for pulmonary edema. Bony
thorax is unremarkable. No significant pleural effusion is noted.
IMPRESSION: Stable cardiomegaly and central pulmonary vascular congestion.
Mildly increased interstitial densities are noted throughout the
right lung concerning for pulmonary edema.

## 2019-06-06 IMAGING — CT CT CHEST W/O CM
2 of 4 series · 15 of 36 positions shown, 18 images · non-contrast
Comparison: No prior chest CT, chest x-ray 01/11/2017

CLINICAL DATA: 76-year-old male with a history of altered mental
status

EXAM:
CT CHEST WITHOUT CONTRAST
TECHNIQUE: Multidetector CT imaging of the chest was performed following the
standard protocol without IV contrast.

[Series 3: chest wo · axial · 0.84mm/px · z∈[-533,-269]mm · 12 of 156 slices shown, 15 images]
[im 12/156  mediastinal]
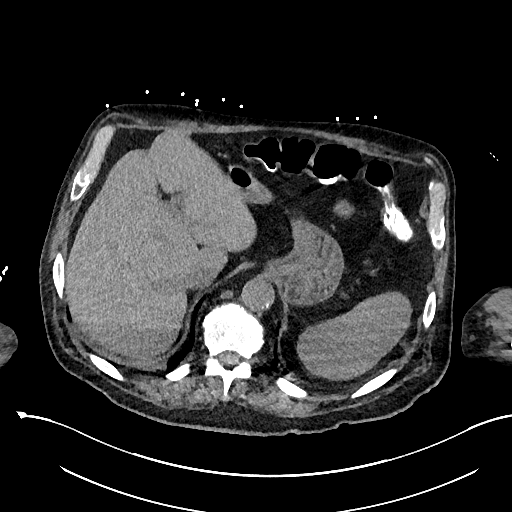
[im 12/156  lung]
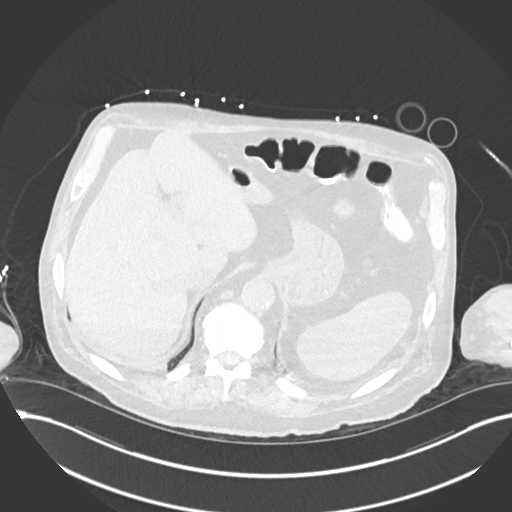
[im 24/156  lung]
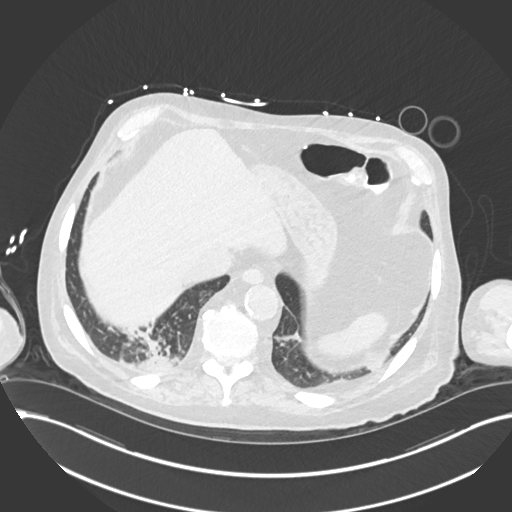
[im 36/156  lung]
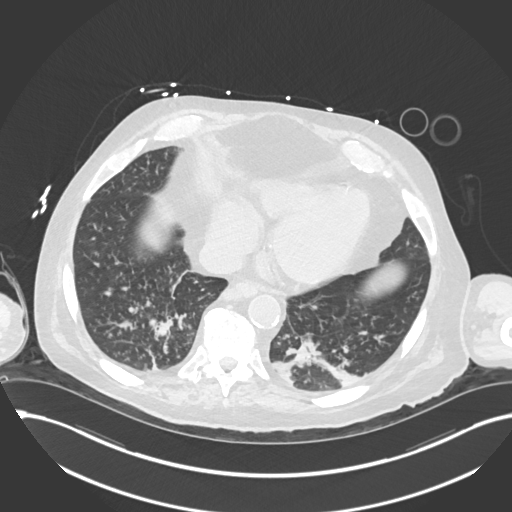
[im 48/156  lung]
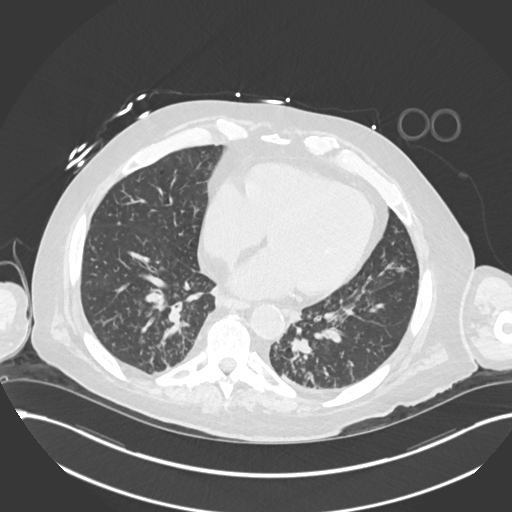
[im 60/156  mediastinal]
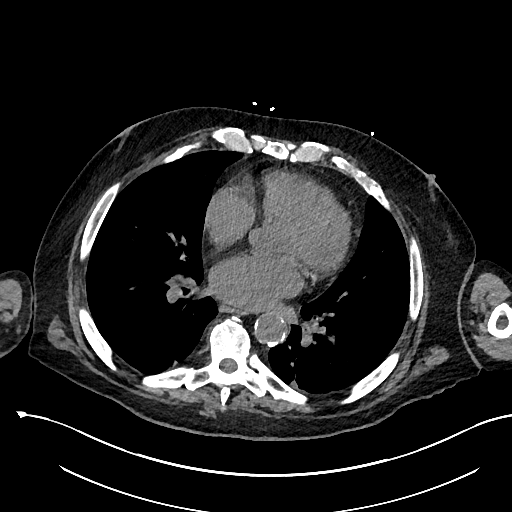
[im 60/156  lung]
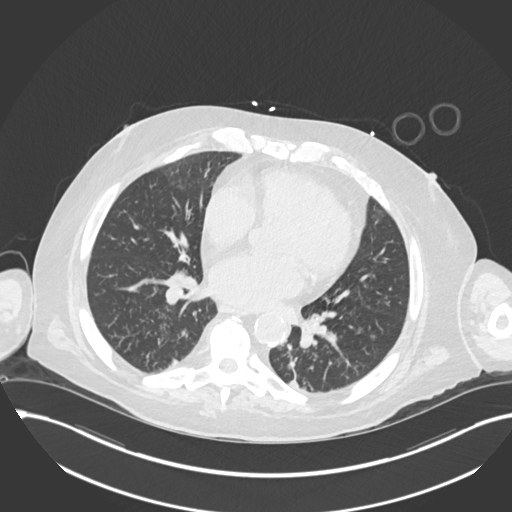
[im 72/156  lung]
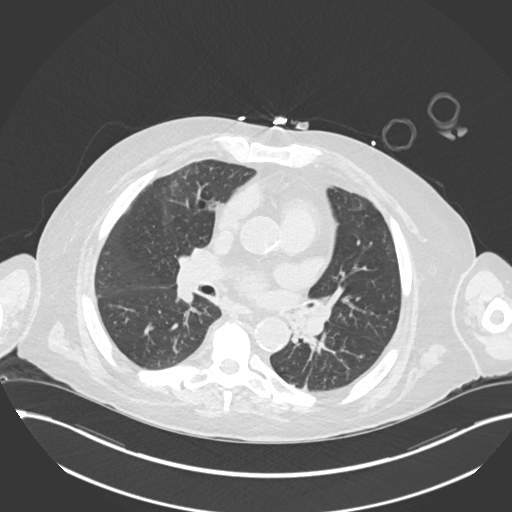
[im 84/156  lung]
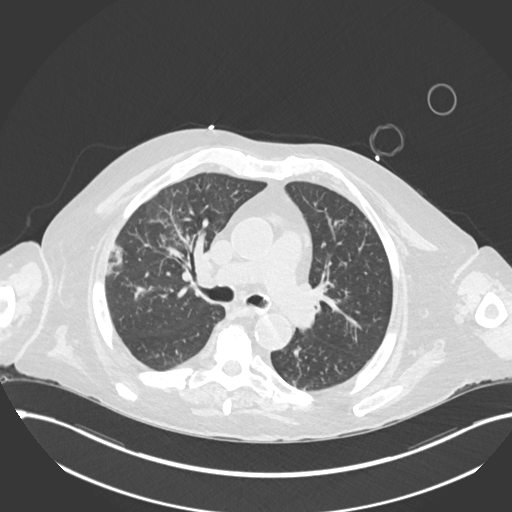
[im 96/156  lung]
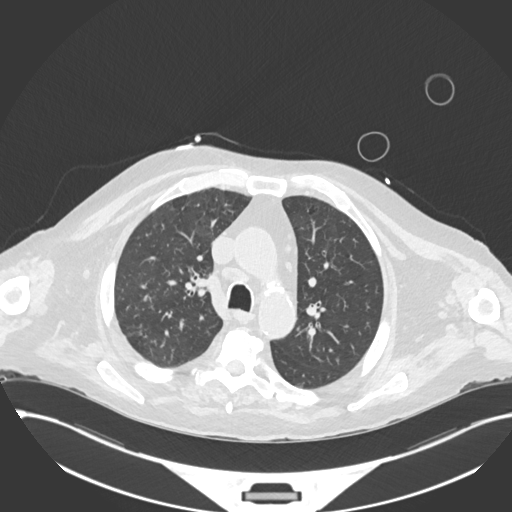
[im 108/156  mediastinal]
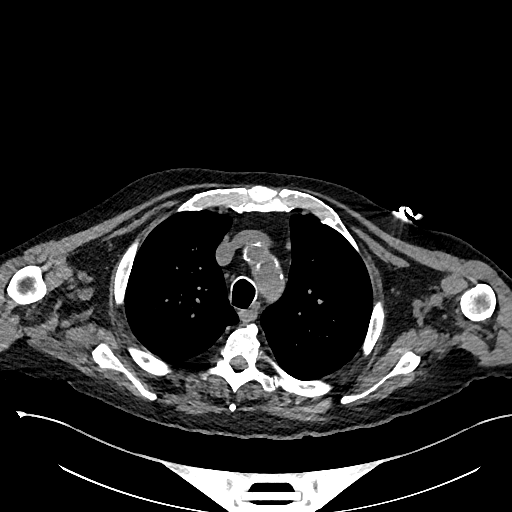
[im 108/156  lung]
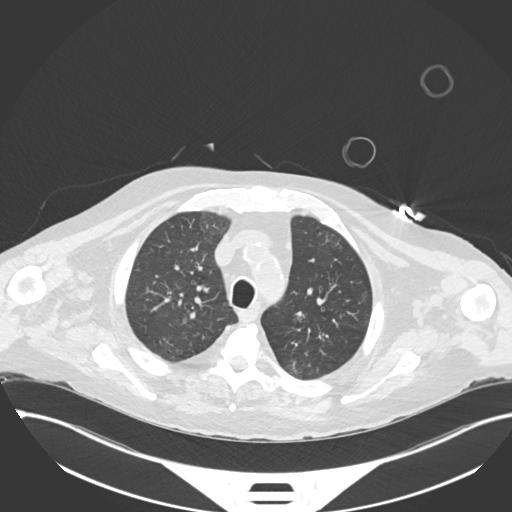
[im 120/156  lung]
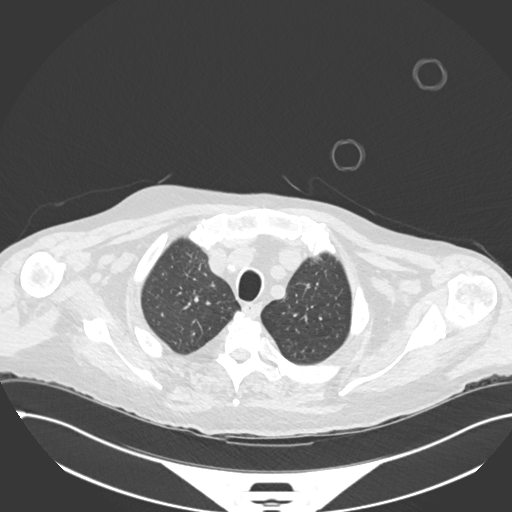
[im 132/156  lung]
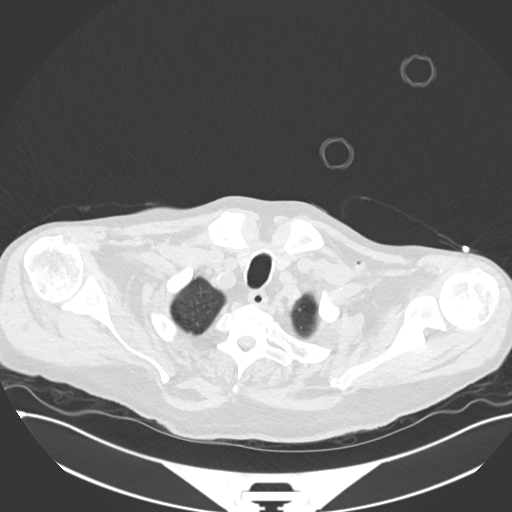
[im 144/156  lung]
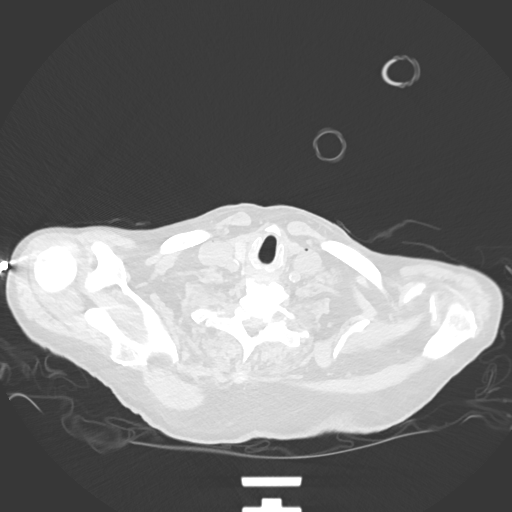

[Series 6: cor · coronal · 0.57mm/px · 3 of 149 slices shown]
[im 30/149  lung]
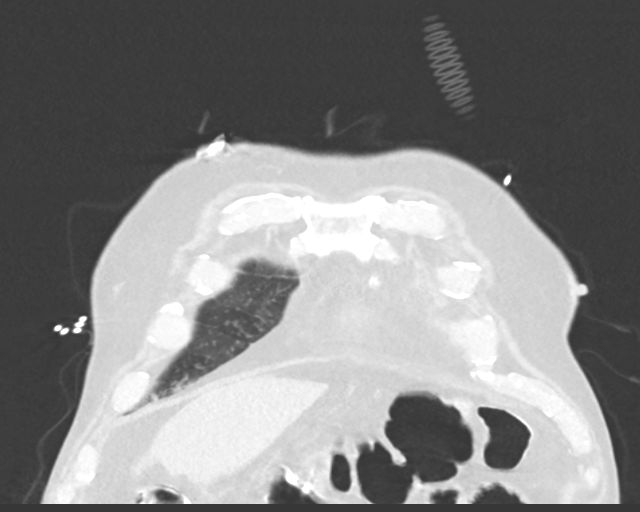
[im 60/149  lung]
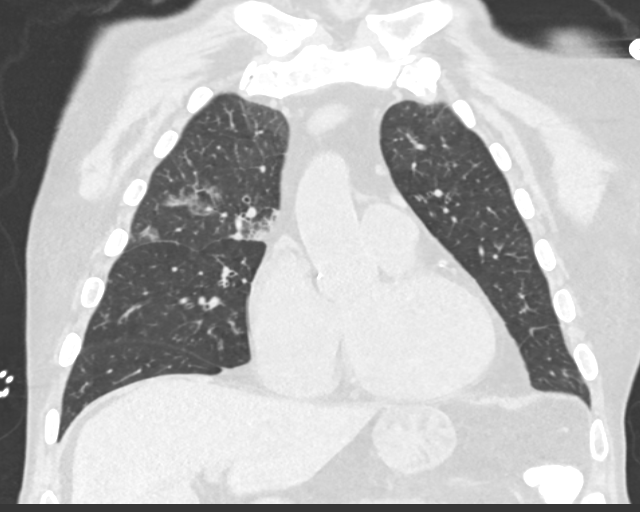
[im 89/149  lung]
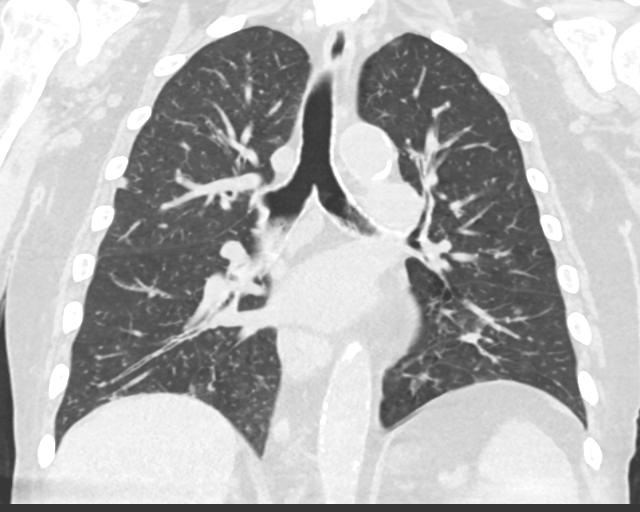

[15 of 36 positions shown; findings below may reference images not displayed]

FINDINGS: Cardiovascular: Heart size borderline enlarged. Differential
attenuation of the blood pool and the myocardium, compatible with
anemia. Dense calcifications of left main, left anterior descending,
circumflex, right coronary arteries. No pericardial fluid/
thickening. Calcifications of the aortic arch. No aneurysm. No
periaortic fluid. Calcifications extend into the branch vessels.

Unremarkable diameter of the main pulmonary artery.

Mediastinum/Nodes: Multiple borderline enlarged lymph nodes of the
mediastinum, including prevascular, upper and lower paratracheal,
and subcarinal.

Lungs/Pleura: Endobronchial debris within the left mainstem
bronchus. Circumferential bronchial wall thickening predominantly of
the lower lobes. Mixed ground-glass and nodular opacities of the
right upper lobe and less of the left upper lobe. Centrilobular
opacities with mixed linear and nodular opacities in a
peribronchovascular distribution of the lower lobes. Trace
left-sided pleural effusion.

Upper Abdomen: No acute abnormality.

Musculoskeletal: No acute displaced fracture. Multilevel
degenerative changes of the spine.
IMPRESSION: Acute bronchopneumonia, with reactive mediastinal adenopathy.

Trace left-sided pleural effusion.

Aortic calcifications with associated left main and 3 vessel
coronary artery disease. Aortic Atherosclerosis ([id number]).
